# Patient Record
Sex: Female | Born: 1950 | Race: White | Hispanic: No | Marital: Married | Smoking: Never smoker
Health system: Southern US, Community
[De-identification: ages and names within clinical notes are randomized; demographics above are authoritative.]

## PROBLEM LIST (undated history)

## (undated) DIAGNOSIS — E119 Type 2 diabetes mellitus without complications: Secondary | ICD-10-CM

## (undated) DIAGNOSIS — F419 Anxiety disorder, unspecified: Secondary | ICD-10-CM

## (undated) DIAGNOSIS — C801 Malignant (primary) neoplasm, unspecified: Secondary | ICD-10-CM

## (undated) DIAGNOSIS — E785 Hyperlipidemia, unspecified: Secondary | ICD-10-CM

## (undated) HISTORY — PX: APPENDECTOMY: SHX54

## (undated) HISTORY — PX: NOSE SURGERY: SHX723

## (undated) HISTORY — PX: EYE SURGERY: SHX253

## (undated) HISTORY — PX: OTHER SURGICAL HISTORY: SHX169

## (undated) HISTORY — PX: REDUCTION MAMMAPLASTY: SUR839

## (undated) HISTORY — DX: Hyperlipidemia, unspecified: E78.5

## (undated) HISTORY — PX: BREAST SURGERY: SHX581

## (undated) HISTORY — DX: Anxiety disorder, unspecified: F41.9

## (undated) HISTORY — PX: COSMETIC SURGERY: SHX468

---

## 2005-02-14 LAB — HM COLONOSCOPY: HM Colonoscopy: NORMAL

## 2006-09-26 ENCOUNTER — Encounter (INDEPENDENT_AMBULATORY_CARE_PROVIDER_SITE_OTHER): Payer: Self-pay | Admitting: Gynecology

## 2006-09-26 ENCOUNTER — Ambulatory Visit: Payer: Self-pay | Admitting: Gynecology

## 2007-11-06 ENCOUNTER — Ambulatory Visit: Payer: Self-pay | Admitting: Gynecology

## 2009-03-27 ENCOUNTER — Emergency Department: Payer: Self-pay | Admitting: Emergency Medicine

## 2009-11-03 ENCOUNTER — Emergency Department: Payer: Self-pay | Admitting: Emergency Medicine

## 2010-02-26 DIAGNOSIS — E785 Hyperlipidemia, unspecified: Secondary | ICD-10-CM | POA: Insufficient documentation

## 2010-12-26 LAB — HM DEXA SCAN

## 2011-01-08 ENCOUNTER — Encounter
Admission: RE | Admit: 2011-01-08 | Discharge: 2011-01-08 | Payer: Self-pay | Source: Home / Self Care | Attending: Internal Medicine | Admitting: Internal Medicine

## 2011-02-11 LAB — HM COLONOSCOPY: HM Colonoscopy: NORMAL

## 2011-02-15 LAB — HM MAMMOGRAPHY: HM Mammogram: NORMAL

## 2011-04-27 NOTE — Assessment & Plan Note (Signed)
NAMETAIZ, BICKLE NO.:  [id number]   MEDICAL RECORD NO.:  [id number]          PATIENT TYPE:  POB   LOCATION:  [facility] at [facility]         FACILITY:  [facility]   PHYSICIAN:  Allie Bossier, MD        DATE OF BIRTH:  [birth year]   DATE OF SERVICE:  11/06/2007                                  CLINIC NOTE   Alexis James is a 60 year old married white gravida 2, para 3 who is here for  her annual exam.  Her complaint today is that of a 2-year history of hot  flashes. This is improved since she started generic Celexa about 6  months ago.  She currently declines hormone replacement therapy.  She  has no other complaints.   PAST MEDICAL HISTORY:  High cholesterol and history of fibroids.   REVIEW OF SYSTEMS:  She has been married for 37 years.  She denies  dyspareunia, except dryness. Her husband recently suffered a myocardial  infarction in October 2008 and underwent a CABG x5.   PAST SURGICAL HISTORY:  1. Cesarean section times two.  2. Incidental appendectomy.  3. Breast reduction.  4. Nasal surgery.   No latex allergies.  No allergies to medicine.   SOCIAL HISTORY:  Is negative for tobacco, alcohol or drug use.   FAMILY HISTORY:  Is negative for breast, GYN and colon malignancies.   MEDICATIONS:  1. Multivitamin daily.  2. Calcium daily.  3. Proventil daily.  4. Pyrexin one a day.  5. Lovastatin 10 mg daily.  6. Celexa generic 20 mg daily.   REVIEW OF SYSTEMS:  She has never had a colonoscopy and her stress test  was done September 2008 and it was negative.   PHYSICAL EXAM:  Height 5 feet 2-3/4. Weight 132-1/2 pound.  Blood  pressure 111/59, pulse 79.  HEENT:  Normal.  HEART:  Regular rate rhythm.  LUNGS:  Clear sensation bilaterally.  ABDOMEN:  Scaphoid abdomen.  No masses.  BREASTS: Exam normal.  No masses.  EXTERNAL GENITALIA:  Significant atrophy.  No lesions.  Uterus  retroverted six week size consistent with fibroids.  No adnexal masses  or  tenderness.   ASSESSMENT/PLAN:  Annual exam.  Recommended self-breast exam and self  vulvar exams monthly. For health maintenance, I have recommended she  schedule a colonoscopy with a gastroenterologist. Because her she has  had all normal Pap smears in her past, I will forego Pap smear today and  we will plan on doing that at her next exam.      Allie Bossier, MD     MCD/MEDQ  D:  11/06/2007  T:  11/06/2007  Job:  [id number]

## 2011-10-15 ENCOUNTER — Other Ambulatory Visit: Payer: Self-pay | Admitting: Internal Medicine

## 2011-10-25 ENCOUNTER — Telehealth: Payer: Self-pay | Admitting: Internal Medicine

## 2011-10-25 DIAGNOSIS — R05 Cough: Secondary | ICD-10-CM

## 2011-10-25 DIAGNOSIS — R059 Cough, unspecified: Secondary | ICD-10-CM

## 2011-10-25 NOTE — Telephone Encounter (Signed)
Patient has been coughing for  a couple of weeks, dry hacking more when she is up and moving around nothing come up when she coughs.  She has taken robitussin, and delsylmia, and tylenol cold and flu would like a prescription cough medication.  She states she hasn't been sleeping because of the cough at night as well.  Has even woke up a few mornings with a sinus headache.  She uses [facility] [address] [employer].  Please advise.

## 2011-10-26 MED ORDER — BENZONATATE 200 MG PO CAPS: 200.0000 mg | ORAL_CAPSULE | Freq: Three times a day (TID) | ORAL | Status: AC | PRN

## 2011-10-26 NOTE — Telephone Encounter (Signed)
rx for tessalon cough tablets sent to [facility]. If she needs anything tronger she will need to be evaluated so please offer her an appt

## 2011-10-26 NOTE — Telephone Encounter (Signed)
Left message notifying patient. Asked that she call the office to schedule appt if something stronger is needed.

## 2011-11-11 ENCOUNTER — Encounter: Payer: Self-pay | Admitting: Internal Medicine

## 2011-11-11 ENCOUNTER — Ambulatory Visit (INDEPENDENT_AMBULATORY_CARE_PROVIDER_SITE_OTHER): Payer: Self-pay | Admitting: Internal Medicine

## 2011-11-11 VITALS — BP 112/60 | HR 79 | Temp 98.2°F | Wt 141.0 lb

## 2011-11-11 DIAGNOSIS — J329 Chronic sinusitis, unspecified: Secondary | ICD-10-CM

## 2011-11-11 MED ORDER — BENZONATATE 200 MG PO CAPS: 200.0000 mg | ORAL_CAPSULE | Freq: Three times a day (TID) | ORAL | Status: DC | PRN

## 2011-11-11 MED ORDER — AMOXICILLIN-POT CLAVULANATE 875-125 MG PO TABS: 1.0000 | ORAL_TABLET | Freq: Two times a day (BID) | ORAL | Status: AC

## 2011-11-11 MED ORDER — HYDROCODONE-ACETAMINOPHEN 5-325 MG PO TABS: ORAL_TABLET | ORAL | Status: DC

## 2011-11-11 NOTE — Progress Notes (Signed)
Subjective:    Patient ID: Alexis James, female    DOB: March 05, 1951, 60 y.o.   MRN: [id number]  Sinusitis This is a new problem. The current episode started 1 to 4 weeks ago. The problem has been gradually worsening since onset. There has been no fever. The pain is moderate. Associated symptoms include chills, congestion, coughing, ear pain, headaches, sinus pressure and a sore throat. Pertinent negatives include no neck pain, shortness of breath or swollen glands. Past treatments include acetaminophen, oral decongestants, spray decongestants and saline sprays. The treatment provided no relief.     Outpatient Encounter Prescriptions as of 11/11/2011  Medication Sig Dispense Refill  . aspirin EC 81 MG tablet Take 81 mg by mouth every other day.        . benzonatate (TESSALON) 100 MG capsule Take 100 mg by mouth 3 (three) times daily as needed.        . calcium-vitamin D (OSCAL) 250-125 MG-UNIT per tablet Take 1 tablet by mouth daily.        . citalopram (CELEXA) 20 MG tablet Take 20 mg by mouth daily.        . fish oil-omega-3 fatty acids 1000 MG capsule Take 2 g by mouth daily.        Marland Kitchen lovastatin (MEVACOR) 20 MG tablet TAKE TWO TABLETS BY MOUTH EVERY DAY  60 tablet  3  . amoxicillin-clavulanate (AUGMENTIN) 875-125 MG per tablet Take 1 tablet by mouth 2 (two) times daily.  28 tablet  0  . HYDROcodone-acetaminophen (NORCO) 5-325 MG per tablet 1-2 tablets every 4-6 hr as needed for cough  60 tablet  0  . DISCONTD: benzonatate (TESSALON) 200 MG capsule Take 1 capsule (200 mg total) by mouth 3 (three) times daily as needed for cough.  20 capsule  0     Review of Systems  Constitutional: Positive for chills and fatigue. Negative for fever.  HENT: Positive for ear pain, congestion, sore throat, rhinorrhea and sinus pressure. Negative for neck pain and ear discharge.   Respiratory: Positive for cough. Negative for chest tightness, shortness of breath and stridor.   Cardiovascular: Negative for  chest pain.  Gastrointestinal: Negative for abdominal pain.  Musculoskeletal: Negative for myalgias and arthralgias.  Neurological: Positive for headaches.   BP 112/60  Pulse 79  Temp(Src) 98.2 F (36.8 C) (Oral)  Wt 141 lb (63.957 kg)  SpO2 98%     Objective:   Physical Exam  Constitutional: She is oriented to person, place, and time. She appears well-developed and well-nourished. No distress.  HENT:  Head: Normocephalic and atraumatic.    Right Ear: External ear normal. A middle ear effusion is present.  Left Ear: External ear normal. A middle ear effusion is present.  Nose: Nose normal.  Mouth/Throat: Posterior oropharyngeal erythema present. No oropharyngeal exudate.  Eyes: Conjunctivae are normal. Pupils are equal, round, and reactive to light. Right eye exhibits no discharge. Left eye exhibits no discharge. No scleral icterus.  Neck: Normal range of motion. Neck supple. No tracheal deviation present. No thyromegaly present.  Cardiovascular: Normal rate, regular rhythm, normal heart sounds and intact distal pulses.  Exam reveals no gallop and no friction rub.   No murmur heard. Pulmonary/Chest: Effort normal and breath sounds normal. No respiratory distress. She has no wheezes. She has no rales. She exhibits no tenderness.  Musculoskeletal: Normal range of motion. She exhibits no edema and no tenderness.  Lymphadenopathy:    She has no cervical adenopathy.  Neurological: She is  alert and oriented to person, place, and time. No cranial nerve deficit. She exhibits normal muscle tone. Coordination normal.  Skin: Skin is warm and dry. No rash noted. She is not diaphoretic. No erythema. No pallor.  Psychiatric: She has a normal mood and affect. Her behavior is normal. Judgment and thought content normal.          Assessment & Plan:  1. Sinusitis - Will treat with augmentin and use hydrocodone for cough. Pt will continue Mucinex as needed. Pt will call if symptoms not  improving over next 48hr.

## 2012-01-03 ENCOUNTER — Encounter: Payer: Self-pay | Admitting: Internal Medicine

## 2012-01-03 DIAGNOSIS — Z1211 Encounter for screening for malignant neoplasm of colon: Secondary | ICD-10-CM | POA: Insufficient documentation

## 2012-01-03 DIAGNOSIS — Z124 Encounter for screening for malignant neoplasm of cervix: Secondary | ICD-10-CM | POA: Insufficient documentation

## 2012-01-10 ENCOUNTER — Other Ambulatory Visit: Payer: Self-pay | Admitting: Internal Medicine

## 2012-02-15 ENCOUNTER — Encounter: Payer: Self-pay | Admitting: Internal Medicine

## 2012-02-19 ENCOUNTER — Other Ambulatory Visit: Payer: Self-pay | Admitting: Internal Medicine

## 2012-02-20 NOTE — Telephone Encounter (Signed)
Refill changed to 40 mg tablet #90 day supply

## 2012-02-21 ENCOUNTER — Other Ambulatory Visit: Payer: Self-pay | Admitting: Internal Medicine

## 2012-02-22 ENCOUNTER — Other Ambulatory Visit: Payer: Self-pay | Admitting: Internal Medicine

## 2012-02-22 MED ORDER — LOVASTATIN 40 MG PO TABS: 40.0000 mg | ORAL_TABLET | Freq: Every day | ORAL | Status: DC

## 2012-02-23 ENCOUNTER — Other Ambulatory Visit: Payer: Self-pay | Admitting: Internal Medicine

## 2012-02-23 MED ORDER — LOVASTATIN 20 MG PO TABS: 40.0000 mg | ORAL_TABLET | Freq: Every day | ORAL | Status: DC

## 2012-05-26 ENCOUNTER — Encounter: Payer: Self-pay | Admitting: Internal Medicine

## 2013-01-30 ENCOUNTER — Telehealth: Payer: Self-pay | Admitting: Internal Medicine

## 2013-01-30 ENCOUNTER — Other Ambulatory Visit: Payer: Self-pay | Admitting: Internal Medicine

## 2013-01-30 DIAGNOSIS — Z0000 Encounter for general adult medical examination without abnormal findings: Secondary | ICD-10-CM

## 2013-01-30 NOTE — Telephone Encounter (Signed)
She can be moved up into a 30 minute physical slot.  We have her records. i cannto refill her lovastatin until she has fasting ;i[ids and a CMET. Done bc it has been over one year.  Her citalopram canb e refilled,  But not the vicodin

## 2013-01-30 NOTE — Telephone Encounter (Signed)
Pt has made an appt 6/9 as a new pt.  She is a previous pt of Dr. Darrick Huntsman but she has not seen Dr. Darrick Huntsman in this office (has seen Dr. Dan Humphreys for an acute visit here). Pt is running out of her medication.  Pt is asking if her meds can be filled or if her appt can be moved closer so that her meds can be filled if she needs to be seen first.

## 2013-01-31 NOTE — Telephone Encounter (Signed)
No refills until cmet and fasting lipids done

## 2013-02-01 MED ORDER — CITALOPRAM HYDROBROMIDE 20 MG PO TABS: ORAL_TABLET | ORAL | Status: DC

## 2013-02-01 NOTE — Telephone Encounter (Signed)
Pt notified and CPE and Lab appts made, citalopram refilled, and orders placed.

## 2013-02-09 ENCOUNTER — Other Ambulatory Visit (INDEPENDENT_AMBULATORY_CARE_PROVIDER_SITE_OTHER): Payer: BC Managed Care – PPO

## 2013-02-09 DIAGNOSIS — Z0000 Encounter for general adult medical examination without abnormal findings: Secondary | ICD-10-CM

## 2013-02-09 LAB — LIPID PANEL
HDL: 61.6 mg/dL (ref >39.00)
Total CHOL/HDL Ratio: 3
Triglycerides: 82 mg/dL (ref 0.0–149.0)
VLDL: 16.4 mg/dL (ref 0.0–40.0)

## 2013-02-09 LAB — COMPREHENSIVE METABOLIC PANEL
ALT: 24 U/L (ref 0–35)
Alkaline Phosphatase: 63 U/L (ref 39–117)
Creatinine, Ser: 0.8 mg/dL (ref 0.4–1.2)
Sodium: 140 mEq/L (ref 135–145)
Total Bilirubin: 0.6 mg/dL (ref 0.3–1.2)
Total Protein: 6.5 g/dL (ref 6.0–8.3)

## 2013-02-14 ENCOUNTER — Encounter: Payer: Self-pay | Admitting: Internal Medicine

## 2013-02-14 ENCOUNTER — Ambulatory Visit (INDEPENDENT_AMBULATORY_CARE_PROVIDER_SITE_OTHER): Payer: BC Managed Care – PPO | Admitting: Internal Medicine

## 2013-02-14 ENCOUNTER — Other Ambulatory Visit (HOSPITAL_COMMUNITY)
Admission: RE | Admit: 2013-02-14 | Discharge: 2013-02-14 | Disposition: A | Discharge status: Home / Self Care | Payer: BC Managed Care – PPO | Source: Ambulatory Visit | Attending: Internal Medicine | Admitting: Internal Medicine

## 2013-02-14 VITALS — BP 108/60 | HR 58 | Temp 97.5°F | Resp 16 | Ht 63.0 in | Wt 140.8 lb

## 2013-02-14 DIAGNOSIS — Z0000 Encounter for general adult medical examination without abnormal findings: Secondary | ICD-10-CM

## 2013-02-14 DIAGNOSIS — Z01419 Encounter for gynecological examination (general) (routine) without abnormal findings: Secondary | ICD-10-CM | POA: Insufficient documentation

## 2013-02-14 DIAGNOSIS — Z1151 Encounter for screening for human papillomavirus (HPV): Secondary | ICD-10-CM | POA: Insufficient documentation

## 2013-02-14 DIAGNOSIS — E785 Hyperlipidemia, unspecified: Secondary | ICD-10-CM

## 2013-02-14 DIAGNOSIS — F411 Generalized anxiety disorder: Secondary | ICD-10-CM

## 2013-02-14 DIAGNOSIS — Z1239 Encounter for other screening for malignant neoplasm of breast: Secondary | ICD-10-CM

## 2013-02-14 DIAGNOSIS — Z124 Encounter for screening for malignant neoplasm of cervix: Secondary | ICD-10-CM

## 2013-02-14 NOTE — Progress Notes (Signed)
Patient ID: Alexis James, female   DOB: Sep 27, 1951, 62 y.o.   MRN: [id number]    Subjective:     Alexis James is a 62 y.o. female and is here for a comprehensive physical exam. The patient reports chronic anxiety managed with celexa. She was treated by [facility] in December for an uncomplicated URI and all symptoms have resolved.    History   Social History  . Marital Status: Married    Spouse Name: N/A    Number of Children: 3  . Years of Education: N/A   Occupational History  . Not on file.   Social History Main Topics  . Smoking status: Passive Smoke Exposure - Never Smoker  . Smokeless tobacco: Never Used     Comment: Husband smoked x 38 years (quit 4 years ago)  . Alcohol Use: Yes     Comment: Occasional  . Drug Use: No  . Sexually Active: Not on file   Other Topics Concern  . Not on file   Social History Narrative  . No narrative on file   Health Maintenance  Topic Date Due  . Tetanus/tdap  05/17/1969  . Zostavax  05/17/2010  . Mammogram  02/14/2013  . Influenza Vaccine  08/13/2013  . Pap Smear  12/02/2013  . Colonoscopy  02/15/2015    The following portions of the patient's history were reviewed and updated as appropriate: allergies, current medications, past family history, past medical history, past social history, past surgical history and problem list.  Review of Systems A comprehensive review of systems was negative.   Objective:    BP 108/60  Pulse 58  Temp(Src) 97.5 F (36.4 C) (Oral)  Resp 16  Ht 5\' 3"  (1.6 m)  Wt 140 lb 12 oz (63.844 kg)  BMI 24.94 kg/m2  SpO2 96%  General Appearance:    Alert, cooperative, no distress, appears stated age  Head:    Normocephalic, without obvious abnormality, atraumatic  Eyes:    PERRL, conjunctiva/corneas clear, EOM's intact, fundi    benign, both eyes  Ears:    Normal TM's and external ear canals, both ears  Nose:   Nares normal, septum midline, mucosa normal, no drainage    or sinus tenderness   Throat:   Lips, mucosa, and tongue normal; teeth and gums normal  Neck:   Supple, symmetrical, trachea midline, no adenopathy;    thyroid:  no enlargement/tenderness/nodules; no carotid   bruit or JVD  Back:     Symmetric, no curvature, ROM normal, no CVA tenderness  Lungs:     Clear to auscultation bilaterally, respirations unlabored  Chest Wall:    No tenderness or deformity   Heart:    Regular rate and rhythm, S1 and S2 normal, no murmur, rub   or gallop  Breast Exam:    No tenderness, masses, or nipple abnormality  Abdomen:     Soft, non-tender, bowel sounds active all four quadrants,    no masses, no organomegaly  Genitalia:    Pelvic: cervix normal in appearance, external genitalia normal, no adnexal masses or tenderness, no cervical motion tenderness, rectovaginal septum normal, uterus normal size, shape, and consistency and vagina normal without discharge  Extremities:   Extremities normal, atraumatic, no cyanosis or edema  Pulses:   2+ and symmetric all extremities  Skin:   Skin color, texture, turgor normal, no rashes or lesions  Lymph nodes:   Cervical, supraclavicular, and axillary nodes normal  Neurologic:   CNII-XII intact, normal strength,  sensation and reflexes    throughout    Assessment:    HYPERLIPIDEMIA-MIXED LDL and triglycerides are at goal on current medications. She has no side effects and liver enzymes are normal. No changes today   Routine general medical examination at a health care facility Annual comprehensive exam was done including breast, pelvic and PAP smear. All screenings have been addressed .   Generalized anxiety disorder Symptoms have been managed with daily SSRI therapy and infrequent use of anxiolytics.  Patient has periodically suspended medication with return of symptoms.  Refills given.    Updated Medication List Outpatient Encounter Prescriptions as of 02/14/2013  Medication Sig Dispense Refill  . aspirin EC 81 MG tablet Take 81 mg by  mouth every other day.       . calcium-vitamin D (OSCAL) 250-125 MG-UNIT per tablet Take 1 tablet by mouth daily.        . citalopram (CELEXA) 20 MG tablet TAKE ONE TABLET BY MOUTH EVERY DAY  90 tablet  3  . lovastatin (MEVACOR) 20 MG tablet Take 2 tablets (40 mg total) by mouth at bedtime.  180 tablet  3  . Multiple Vitamin (MULTIVITAMIN) tablet Take 1 tablet by mouth daily.      . fish oil-omega-3 fatty acids 1000 MG capsule Take 2 g by mouth daily.        . [DISCONTINUED] benzonatate (TESSALON) 100 MG capsule Take 100 mg by mouth 3 (three) times daily as needed.        . [DISCONTINUED] HYDROcodone-acetaminophen (NORCO) 5-325 MG per tablet 1-2 tablets every 4-6 hr as needed for cough  60 tablet  0   No facility-administered encounter medications on file as of 02/14/2013.

## 2013-02-14 NOTE — Patient Instructions (Addendum)
Your reflux needs to be treated more decisively with a proton pump inhibitor.  I would like you to try taking OTC prilosec (omeprazole) daily in the monrining 20 minutes prior to any food or coffee.  If it does not control your symptoms,  Let me know.   We will repeat your DEXA scan in 2015.Marland Kitchen Until then you should try to get 1200 mg calcium(half through your diet,  Half with a supplement)  and 2000 units  of Vitamin D daily to keep your bones strong

## 2013-02-16 ENCOUNTER — Encounter: Payer: Self-pay | Admitting: Internal Medicine

## 2013-02-16 DIAGNOSIS — F411 Generalized anxiety disorder: Secondary | ICD-10-CM | POA: Insufficient documentation

## 2013-02-16 DIAGNOSIS — E785 Hyperlipidemia, unspecified: Secondary | ICD-10-CM | POA: Insufficient documentation

## 2013-02-16 DIAGNOSIS — Z0000 Encounter for general adult medical examination without abnormal findings: Secondary | ICD-10-CM | POA: Insufficient documentation

## 2013-02-16 DIAGNOSIS — F419 Anxiety disorder, unspecified: Secondary | ICD-10-CM | POA: Insufficient documentation

## 2013-02-16 NOTE — Assessment & Plan Note (Signed)
Symptoms have been managed with daily SSRI therapy and infrequent use of anxiolytics.  Patient has periodically suspended medication with return of symptoms.  Refills given.

## 2013-02-16 NOTE — Assessment & Plan Note (Signed)
Annual comprehensive exam was done including breast, pelvic and PAP smear. All screenings have been addressed .  

## 2013-02-16 NOTE — Assessment & Plan Note (Signed)
LDL and triglycerides are at goal on current medications. She has no side effects and liver enzymes are normal. No changes today.  

## 2013-02-20 ENCOUNTER — Other Ambulatory Visit: Payer: Self-pay | Admitting: Internal Medicine

## 2013-02-21 NOTE — Telephone Encounter (Signed)
MEd filled.  

## 2013-02-26 ENCOUNTER — Telehealth: Payer: Self-pay | Admitting: Emergency Medicine

## 2013-02-26 NOTE — Telephone Encounter (Signed)
Patient has been taking Prilosec X 5days, the patient feels like it is making her stomach hurt. She is wondering if she could take Pepcid instead? Please advise.

## 2013-02-26 NOTE — Telephone Encounter (Signed)
Pt.notified

## 2013-02-26 NOTE — Telephone Encounter (Signed)
Yes, she can take pepcid up to 40 mg daily instead of prilosec

## 2013-03-07 ENCOUNTER — Ambulatory Visit: Payer: Self-pay | Admitting: Internal Medicine

## 2013-03-07 LAB — HM PAP SMEAR: HM PAP: NORMAL

## 2013-03-07 LAB — HM MAMMOGRAPHY
HM MAMMO: NORMAL
HM Mammogram: NORMAL

## 2013-03-28 ENCOUNTER — Encounter: Payer: Self-pay | Admitting: Internal Medicine

## 2013-04-27 ENCOUNTER — Telehealth: Payer: Self-pay | Admitting: *Deleted

## 2013-04-27 NOTE — Telephone Encounter (Signed)
Pt was seen in March, had a mammogram done at [facility]. Received a statement stating that her mammogram was coded incorrectly. Needs it corrected and resent to insurance. Pt also stated that she was suppose to go to a in-house facility instead of the hospital to have her mammogram done & it would have been covered at 100%.

## 2013-04-28 NOTE — Telephone Encounter (Signed)
Carollee Herter please handle this call.  Thank you

## 2013-04-30 ENCOUNTER — Ambulatory Visit (INDEPENDENT_AMBULATORY_CARE_PROVIDER_SITE_OTHER): Payer: BC Managed Care – PPO | Admitting: Adult Health

## 2013-04-30 ENCOUNTER — Encounter: Payer: Self-pay | Admitting: Adult Health

## 2013-04-30 ENCOUNTER — Telehealth: Payer: Self-pay | Admitting: Internal Medicine

## 2013-04-30 VITALS — BP 110/62 | HR 78 | Temp 98.0°F | Resp 12 | Wt 138.0 lb

## 2013-04-30 DIAGNOSIS — R197 Diarrhea, unspecified: Secondary | ICD-10-CM | POA: Insufficient documentation

## 2013-04-30 LAB — BASIC METABOLIC PANEL
BUN: 16 mg/dL (ref 6–23)
CO2: 27 mEq/L (ref 19–32)
GFR: 77.01 mL/min (ref >60.00)
Glucose, Bld: 82 mg/dL (ref 70–99)
Potassium: 4.5 mEq/L (ref 3.5–5.1)
Sodium: 136 mEq/L (ref 135–145)

## 2013-04-30 LAB — CBC WITH DIFFERENTIAL/PLATELET
Basophils Relative: 0.3 % (ref 0.0–3.0)
Eosinophils Relative: 1 % (ref 0.0–5.0)
HCT: 36.9 % (ref 36.0–46.0)
MCV: 93 fl (ref 78.0–100.0)
Monocytes Absolute: 0.7 10*3/uL (ref 0.1–1.0)
Monocytes Relative: 16.1 % — ABNORMAL HIGH (ref 3.0–12.0)
Neutrophils Relative %: 62.3 % (ref 43.0–77.0)
Platelets: 251 10*3/uL (ref 150.0–400.0)
RBC: 3.97 Mil/uL (ref 3.87–5.11)
WBC: 4.2 10*3/uL — ABNORMAL LOW (ref 4.5–10.5)

## 2013-04-30 NOTE — Patient Instructions (Addendum)
  Please have labs drawn prior to leaving the office.  Drink plenty of fluids to stay hydrated.  Eat a bland diet such as dry toast, soups that are not cream-based, plain potato and grilled chicken.  If you are not better within 3-4 days please call the office

## 2013-04-30 NOTE — Assessment & Plan Note (Signed)
Check CBC and metabolic panel. Instructed patient to drink fluids and maintain a bland diet for the next couple of days. If symptoms are not improved within 3-4 days she needs to call the office.

## 2013-04-30 NOTE — Telephone Encounter (Signed)
Patient Information:  Caller Name: [place]  Phone: [phone]  Patient: Alexis James  Gender: Female  DOB: 1951-01-26  Age: 62 Years  PCP: Duncan Dull (Adults only)  Office Follow Up:  Does the office need to follow up with this patient?: No  Instructions For The Office: N/A  RN Note:  Reports malaise followed by mild diarrhea (< 3 stools in 24 hrs) for 2 days.  Had two episodes of fecal incontinence.  Diarrhea ended 04/28/13.  Reports syncope 04/27/13 after having BM and near syncope with nausea 04/29/13 early AM.  Continues to feel "drained," fatigued and weak. Posterior leg pain at night resolved with Ibuprofen. Pulse 84 bpm and regular.  Not drinking much; voiding less often than usual.  Advised to hydrate with clear liquids now; report to provider how many oz. she drank at time of appointment.    Symptoms  Reason For Call & Symptoms: Requesting RX for malaise.  Reports feeling drained, weakness, fatigue and two episodes of syncope.  Reviewed Health History In EMR: Yes  Reviewed Medications In EMR: Yes  Reviewed Allergies In EMR: Yes  Reviewed Surgeries / Procedures: Yes  Date of Onset of Symptoms: 04/26/2013  Treatments Tried: immodium, Ibuprofen  Treatments Tried Worked: Yes  Guideline(s) Used:  Weakness (Generalized) and Fatigue  Disposition Per Guideline:   Go to Office Now  Reason For Disposition Reached:   Moderate weakness (i.e., interferes with work, school, normal activities) and cause unknown  Advice Given:  Reassurance  Weakness often accompanies viral illnesses (e.g., colds and flu).  The weakness is usually worse the first 3 days of the illness, then gets better.  Here is some care advice that should help.  Fluids  : Drink several glasses of fruit juice, other clear fluids, or water. This will improve hydration and blood glucose.  Rest  : Lie down with feet elevated for 1 hour. This will improve blood flow and increase blood flow to the brain.  Call Back  If:   Passes out (faints)  You become worse.  Patient Will Follow Care Advice:  YES  Appointment Scheduled:  04/30/2013 11:30:00 Appointment Scheduled Provider:  Orville Govern

## 2013-04-30 NOTE — Progress Notes (Signed)
  Subjective:    Patient ID: Alexis James, female    DOB: Jul 04, 1951, 62 y.o.   MRN: [id number]  HPI  Thursday went out to eat. She felt weak and sick that afternoon. She started to have diarrhea. She felt feverish although she did not take her temperature. She also had a few episodes where she felt she was going to pass out. She has not eaten much of anything since Thursday. She has had a few saltine crackers and fluids. "I'm drained and have no stamina to do anything". Patient took Imodium which helped her diarrhea.   Current Outpatient Prescriptions on File Prior to Visit  Medication Sig Dispense Refill  . aspirin EC 81 MG tablet Take 81 mg by mouth every other day.       . calcium-vitamin D (OSCAL) 250-125 MG-UNIT per tablet Take 1 tablet by mouth daily.        . citalopram (CELEXA) 20 MG tablet TAKE ONE TABLET BY MOUTH EVERY DAY  90 tablet  3  . lovastatin (MEVACOR) 40 MG tablet TAKE ONE TABLET BY MOUTH EVERY DAY AT BEDTIME  90 tablet  0  . Multiple Vitamin (MULTIVITAMIN) tablet Take 1 tablet by mouth daily.       No current facility-administered medications on file prior to visit.     Review of Systems  Constitutional: Positive for chills and fatigue.  Gastrointestinal: Positive for nausea and diarrhea.  Neurological: Positive for weakness.   BP 110/62  Pulse 78  Temp(Src) 98 F (36.7 C) (Oral)  Resp 12  Wt 138 lb (62.596 kg)  BMI 24.45 kg/m2  SpO2 97%    Objective:   Physical Exam  Constitutional: She is oriented to person, place, and time. She appears well-developed and well-nourished. No distress.  Cardiovascular: Normal rate, regular rhythm, normal heart sounds and intact distal pulses.  Exam reveals no gallop and no friction rub.   No murmur heard. Pulmonary/Chest: Effort normal and breath sounds normal. No respiratory distress.  Abdominal: Soft.  Hyperactive bowel sounds  Neurological: She is alert and oriented to person, place, and time.  Skin: Skin is warm and  dry.  Psychiatric: She has a normal mood and affect. Her behavior is normal. Judgment and thought content normal.         Assessment & Plan:

## 2013-05-02 ENCOUNTER — Other Ambulatory Visit: Payer: Self-pay | Admitting: Internal Medicine

## 2013-05-03 NOTE — Telephone Encounter (Signed)
Rx sent to pharmacy by escript  

## 2013-05-14 NOTE — Telephone Encounter (Signed)
I called and spoke with patient she states that [employer] said that it was coded incorrectly.  She states that it should have been coded as screening or routine.  I advised patient that I wasn't over the [facility] billing department and that she would need to contact them and ask them to re-file the claim.  She also did say that when she checked in at [facility] they told her they accepted [employer] and now she feels she was sent to the wrong place.  She is going to take my advise and call the number on her bill and ask them to re-file claim.

## 2013-05-21 ENCOUNTER — Ambulatory Visit: Payer: Self-pay | Admitting: Internal Medicine

## 2013-06-13 ENCOUNTER — Telehealth: Payer: Self-pay | Admitting: Internal Medicine

## 2013-06-13 NOTE — Telephone Encounter (Signed)
Message copied by Lysbeth Galas on Wed Jun 13, 2013  9:17 AM ------      Message from: [place], Kyra Searles      Created: Wed Jun 13, 2013  9:14 AM       I received an e-mail from [facility] stating that the reason the patient has a bill is from [employer] 04-30-13 of $115.63 that is due to patients yearly deductible.  They said that 99213 102.17 pt. And 40981 13.46 pt left due.  I have called the patient and left her a voicemail asking her to return my call.            Carollee Herter       ----- Message -----         From: Lysbeth Galas         Sent: 05/24/2013  12:03 PM           To: Lysbeth Galas            I sent email to charge correction asking them to look at bill.      ----- Message -----         From: Lysbeth Galas         Sent: 05/14/2013   2:30 PM           To: Lysbeth Galas            Please look at bill from [place] she states it is from HPV which is analysis please call her at   [phone]             ------

## 2013-06-13 NOTE — Telephone Encounter (Signed)
Patient returned my call and I advised her that the bill she was receiving from [facility] was from [facility] 04-30-13 not her Physical.  She also asked again about the [facility] bill I advised her that she would need to contact them I couldn't do anything about their bill. She understood and will call them to find out about the bill there.

## 2013-11-09 ENCOUNTER — Other Ambulatory Visit: Payer: Self-pay | Admitting: Internal Medicine

## 2014-02-05 ENCOUNTER — Other Ambulatory Visit: Payer: Self-pay | Admitting: Internal Medicine

## 2014-03-11 ENCOUNTER — Telehealth: Payer: Self-pay | Admitting: Internal Medicine

## 2014-03-11 NOTE — Telephone Encounter (Signed)
Last lipid 2/14 last Ov 5/14 okay to fill?

## 2014-03-11 NOTE — Telephone Encounter (Signed)
Pt has CPE 4/14.  States she is almost out of her citalopram and simvastatin.  Asking if these can be refilled as she does not have enough to get her through to her CPE.

## 2014-03-12 MED ORDER — CITALOPRAM HYDROBROMIDE 20 MG PO TABS: ORAL_TABLET | ORAL | Status: DC

## 2014-03-12 MED ORDER — LOVASTATIN 40 MG PO TABS: ORAL_TABLET | ORAL | Status: DC

## 2014-03-12 NOTE — Telephone Encounter (Signed)
Refill one 30 days only.  Has not been seen in over 6 months so needs office visit prior to any more refills 

## 2014-03-12 NOTE — Telephone Encounter (Signed)
Pt.notified

## 2014-03-26 ENCOUNTER — Telehealth: Payer: Self-pay | Admitting: Internal Medicine

## 2014-03-26 ENCOUNTER — Ambulatory Visit (INDEPENDENT_AMBULATORY_CARE_PROVIDER_SITE_OTHER): Payer: BC Managed Care – PPO | Admitting: Internal Medicine

## 2014-03-26 ENCOUNTER — Encounter: Payer: Self-pay | Admitting: Internal Medicine

## 2014-03-26 ENCOUNTER — Encounter (INDEPENDENT_AMBULATORY_CARE_PROVIDER_SITE_OTHER): Payer: Self-pay

## 2014-03-26 ENCOUNTER — Telehealth: Payer: Self-pay | Admitting: Emergency Medicine

## 2014-03-26 VITALS — BP 104/60 | HR 78 | Temp 98.1°F | Resp 16 | Ht 63.0 in | Wt 143.0 lb

## 2014-03-26 DIAGNOSIS — R5383 Other fatigue: Secondary | ICD-10-CM

## 2014-03-26 DIAGNOSIS — F419 Anxiety disorder, unspecified: Secondary | ICD-10-CM

## 2014-03-26 DIAGNOSIS — E785 Hyperlipidemia, unspecified: Secondary | ICD-10-CM

## 2014-03-26 DIAGNOSIS — E559 Vitamin D deficiency, unspecified: Secondary | ICD-10-CM

## 2014-03-26 DIAGNOSIS — F411 Generalized anxiety disorder: Secondary | ICD-10-CM

## 2014-03-26 DIAGNOSIS — Z0000 Encounter for general adult medical examination without abnormal findings: Secondary | ICD-10-CM

## 2014-03-26 DIAGNOSIS — Z79899 Other long term (current) drug therapy: Secondary | ICD-10-CM

## 2014-03-26 DIAGNOSIS — R42 Dizziness and giddiness: Secondary | ICD-10-CM

## 2014-03-26 DIAGNOSIS — R5381 Other malaise: Secondary | ICD-10-CM

## 2014-03-26 DIAGNOSIS — N951 Menopausal and female climacteric states: Secondary | ICD-10-CM | POA: Insufficient documentation

## 2014-03-26 LAB — COMPREHENSIVE METABOLIC PANEL
ALT: 21 U/L (ref 0–35)
AST: 28 U/L (ref 0–37)
Albumin: 4.1 g/dL (ref 3.5–5.2)
Alkaline Phosphatase: 63 U/L (ref 39–117)
BUN: 23 mg/dL (ref 6–23)
CALCIUM: 9.6 mg/dL (ref 8.4–10.5)
CHLORIDE: 101 meq/L (ref 96–112)
CO2: 28 meq/L (ref 19–32)
CREATININE: 0.7 mg/dL (ref 0.4–1.2)
GFR: 86.72 mL/min (ref >60.00)
Glucose, Bld: 79 mg/dL (ref 70–99)
Potassium: 4.3 mEq/L (ref 3.5–5.1)
Sodium: 138 mEq/L (ref 135–145)
Total Bilirubin: 0.5 mg/dL (ref 0.3–1.2)
Total Protein: 6.6 g/dL (ref 6.0–8.3)

## 2014-03-26 LAB — LDL CHOLESTEROL, DIRECT: Direct LDL: 94.3 mg/dL

## 2014-03-26 LAB — TSH: TSH: 3.15 u[IU]/mL (ref 0.35–5.50)

## 2014-03-26 MED ORDER — ESCITALOPRAM OXALATE 10 MG PO TABS: 10.0000 mg | ORAL_TABLET | Freq: Every day | ORAL | Status: DC

## 2014-03-26 MED ORDER — SULFAMETHOXAZOLE-TMP DS 800-160 MG PO TABS: 1.0000 | ORAL_TABLET | Freq: Two times a day (BID) | ORAL | Status: DC

## 2014-03-26 MED ORDER — SCOPOLAMINE 1 MG/3DAYS TD PT72
1.0000 | MEDICATED_PATCH | TRANSDERMAL | Status: DC
Start: 2014-03-26 — End: 2015-03-28

## 2014-03-26 NOTE — Telephone Encounter (Signed)
[name] 04/17/14 at 120 p.m. Pt aware of the apt scheduled.

## 2014-03-26 NOTE — Assessment & Plan Note (Addendum)
She is tolerating the medication withoutside effects and liver enzymes are normal. No changes today

## 2014-03-26 NOTE — Assessment & Plan Note (Signed)
She has been taking citalopram for years for management of hot flashes which has not really helped but feels better on medication.  Trial of lexapro.

## 2014-03-26 NOTE — Telephone Encounter (Signed)
tdap to be done here after cruise,  Shingles not needed,  My mistake

## 2014-03-26 NOTE — Telephone Encounter (Signed)
At checkout pt states paperwork says to receive shingles and TdaP vaccines.  Pt states she received shingles vaccine when she turned 59.  States it has been a long time since she received tetanus shot so she is okay with receiving.  Appt for blood work was scheduled 4/27, pt states this should be filed as wellness labs so that they are covered by insurance.  Please advise if pt needs shingles vaccine again and when she should receive tetanus vaccine, per pt.

## 2014-03-26 NOTE — Progress Notes (Signed)
Pre-visit discussion using our clinic review tool. No additional management support is needed unless otherwise documented below in the visit note.  

## 2014-03-26 NOTE — Assessment & Plan Note (Signed)
Discussed changing citalopram to lexapro for alleviation of hot flashes

## 2014-03-26 NOTE — Assessment & Plan Note (Signed)
Recent episode discussed.  She has no risk factors for stroke that are not managed .  Reassurance provided.

## 2014-03-26 NOTE — Patient Instructions (Addendum)
You had your annual  wellness exam today  We will schedule your mammogram  At [facility] if they are covered .    You need to have a TDaP vaccine and a Shingles vaccine.  You can return after your cruise to have them done here    Please return for fasting labs  At your lesiure, we have checked your liver enzymes today  We will contact you with the bloodwork results  I have given you prescriptions  For scopalamine patch for seasickness,  Septra for UTI in case you need them on your trip

## 2014-03-26 NOTE — Progress Notes (Signed)
Patient ID: Alexis James, female   DOB: 1951/11/22, 63 y.o.   MRN: [id number]  Subjective:     Alexis James is a 63 y.o. female and is here for a comprehensive physical exam. The patient reports problems - as follows.   Frequent wakenings due to hot flashes,  Which are somewhat improved but still problematic.  She had an episode of dizziness a month ago that woke her up. Lasted 40 minutes,  Vertigo,  followed by a headache.  EMS was called, her vital signs were normal except bp 144/84.  EKG normal,  BCG was normal.  No prior or subsequent episodes.   History   Social History  . Marital Status: Married    Spouse Name: N/A    Number of Children: 3  . Years of Education: N/A   Occupational History  . Not on file.   Social History Main Topics  . Smoking status: Passive Smoke Exposure - Never Smoker  . Smokeless tobacco: Never Used     Comment: Husband smoked x 38 years (quit 4 years ago)  . Alcohol Use: Yes     Comment: Occasional  . Drug Use: No  . Sexual Activity: Not on file   Other Topics Concern  . Not on file   Social History Narrative  . No narrative on file   Health Maintenance  Topic Date Due  . Tetanus/tdap  05/17/1969  . Zostavax  05/17/2010  . Influenza Vaccine  07/13/2014  . Colonoscopy  02/15/2015  . Mammogram  03/08/2015  . Pap Smear  02/15/2016    The following portions of the patient's history were reviewed and updated as appropriate: allergies, current medications, past family history, past medical history, past social history, past surgical history and problem list.  Review of Systems A comprehensive review of systems was negative.   Objective:   BP 104/60  Pulse 78  Temp(Src) 98.1 F (36.7 C) (Oral)  Resp 16  Ht 5\' 3"  (1.6 m)  Wt 143 lb (64.864 kg)  BMI 25.34 kg/m2  SpO2 98%  General appearance: alert, cooperative and appears stated age Head: Normocephalic, without obvious abnormality, atraumatic Eyes: conjunctivae/corneas clear. PERRL,  EOM's intact. Fundi benign. Ears: normal TM's and external ear canals both ears Nose: Nares normal. Septum midline. Mucosa normal. No drainage or sinus tenderness. Throat: lips, mucosa, and tongue normal; teeth and gums normal Neck: no adenopathy, no carotid bruit, no JVD, supple, symmetrical, trachea midline and thyroid not enlarged, symmetric, no tenderness/mass/nodules Lungs: clear to auscultation bilaterally Breasts: normal appearance, no masses or tenderness Heart: regular rate and rhythm, S1, S2 normal, no murmur, click, rub or gallop Abdomen: soft, non-tender; bowel sounds normal; no masses,  no organomegaly Extremities: extremities normal, atraumatic, no cyanosis or edema Pulses: 2+ and symmetric Skin: Skin color, texture, turgor normal. No rashes or lesions Neurologic: Alert and oriented X 3, normal strength and tone. Normal symmetric reflexes. Normal coordination and gait.   Assessment and Plan:   Routine general medical examination at a health care facility Annual comprehensive exam was done including breast, excluding pelvic and PAP smear. All screenings have been addressed .   HYPERLIPIDEMIA-MIXED She is tolerating the medication withoutside effects and liver enzymes are normal. No changes today     Menopausal flushing Discussed changing citalopram to lexapro for alleviation of hot flashes  Anxiety She has been taking citalopram for years for management of hot flashes which has not really helped but feels better on medication.  Trial of lexapro.  Vertigo Recent episode discussed.  She has no risk factors for stroke that are not managed .  Reassurance provided.   A total of 45 minutes was spent with patient more than half of which was spent in counseling, reviewing records from other prviders and coordination of care.  Updated Medication List Outpatient Encounter Prescriptions as of 03/26/2014  Medication Sig  . aspirin EC 81 MG tablet Take 81 mg by mouth every  other day.   . citalopram (CELEXA) 20 MG tablet TAKE ONE TABLET BY MOUTH ONCE DAILY  . lovastatin (MEVACOR) 40 MG tablet TAKE ONE TABLET BY MOUTH AT BEDTIME  . calcium-vitamin D (OSCAL) 250-125 MG-UNIT per tablet Take 1 tablet by mouth daily.    Marland Kitchen escitalopram (LEXAPRO) 10 MG tablet Take 1 tablet (10 mg total) by mouth daily.  . Multiple Vitamin (MULTIVITAMIN) tablet Take 1 tablet by mouth daily.  Marland Kitchen scopolamine (TRANSDERM-SCOP) 1 MG/3DAYS Place 1 patch (1.5 mg total) onto the skin every 3 (three) days.  Marland Kitchen sulfamethoxazole-trimethoprim (BACTRIM DS) 800-160 MG per tablet Take 1 tablet by mouth 2 (two) times daily.

## 2014-03-26 NOTE — Telephone Encounter (Signed)
Please advise if should mail script or have patient come here for Tdap.

## 2014-03-26 NOTE — Assessment & Plan Note (Signed)
Annual comprehensive exam was done including breast, excluding pelvic and PAP smear. All screenings have been addressed .  

## 2014-03-27 LAB — VITAMIN D 25 HYDROXY (VIT D DEFICIENCY, FRACTURES): Vit D, 25-Hydroxy: 49 ng/mL (ref 30–89)

## 2014-03-27 NOTE — Telephone Encounter (Signed)
Patient notified

## 2014-03-28 ENCOUNTER — Encounter: Payer: Self-pay | Admitting: *Deleted

## 2014-04-08 ENCOUNTER — Telehealth: Payer: Self-pay | Admitting: *Deleted

## 2014-04-08 ENCOUNTER — Other Ambulatory Visit (INDEPENDENT_AMBULATORY_CARE_PROVIDER_SITE_OTHER): Payer: BC Managed Care – PPO

## 2014-04-08 DIAGNOSIS — E785 Hyperlipidemia, unspecified: Secondary | ICD-10-CM

## 2014-04-08 DIAGNOSIS — Z79899 Other long term (current) drug therapy: Secondary | ICD-10-CM

## 2014-04-08 LAB — COMPREHENSIVE METABOLIC PANEL
ALT: 24 U/L (ref 0–35)
AST: 24 U/L (ref 0–37)
Albumin: 3.5 g/dL (ref 3.5–5.2)
Alkaline Phosphatase: 60 U/L (ref 39–117)
BILIRUBIN TOTAL: 0.5 mg/dL (ref 0.3–1.2)
BUN: 16 mg/dL (ref 6–23)
CO2: 30 mEq/L (ref 19–32)
CREATININE: 0.6 mg/dL (ref 0.4–1.2)
Calcium: 9.1 mg/dL (ref 8.4–10.5)
Chloride: 104 mEq/L (ref 96–112)
GFR: 101.15 mL/min (ref >60.00)
Glucose, Bld: 85 mg/dL (ref 70–99)
Potassium: 4.5 mEq/L (ref 3.5–5.1)
SODIUM: 140 meq/L (ref 135–145)
TOTAL PROTEIN: 5.9 g/dL — AB (ref 6.0–8.3)

## 2014-04-08 LAB — LIPID PANEL
CHOLESTEROL: 183 mg/dL (ref 0–200)
HDL: 60 mg/dL (ref >39.00)
LDL Cholesterol: 105 mg/dL — ABNORMAL HIGH (ref 0–99)
Total CHOL/HDL Ratio: 3
Triglycerides: 88 mg/dL (ref 0.0–149.0)
VLDL: 17.6 mg/dL (ref 0.0–40.0)

## 2014-04-08 NOTE — Telephone Encounter (Signed)
What labs and dx?  

## 2014-04-11 ENCOUNTER — Encounter: Payer: Self-pay | Admitting: *Deleted

## 2014-04-17 ENCOUNTER — Ambulatory Visit: Payer: Self-pay | Admitting: Internal Medicine

## 2014-04-27 ENCOUNTER — Telehealth: Payer: Self-pay | Admitting: Internal Medicine

## 2014-04-29 MED ORDER — LEXAPRO 10 MG PO TABS: 10.0000 mg | ORAL_TABLET | Freq: Every day | ORAL | Status: DC

## 2014-04-29 MED ORDER — LOVASTATIN 40 MG PO TABS: ORAL_TABLET | ORAL | Status: DC

## 2014-04-29 NOTE — Telephone Encounter (Signed)
Patient would like Lexapro sent to [facility] for 90 day supply ok to fill?

## 2014-04-29 NOTE — Addendum Note (Signed)
Addended by: Nanci Pina on: 04/29/2014 09:36 AM   Modules accepted: Orders

## 2014-04-29 NOTE — Addendum Note (Signed)
Addended by: Crecencio Mc on: 04/29/2014 10:25 AM   Modules accepted: Orders

## 2014-04-29 NOTE — Telephone Encounter (Signed)
Done

## 2014-05-08 ENCOUNTER — Encounter: Payer: Self-pay | Admitting: Internal Medicine

## 2014-10-16 ENCOUNTER — Other Ambulatory Visit: Payer: Self-pay | Admitting: *Deleted

## 2014-10-16 MED ORDER — ESCITALOPRAM OXALATE 10 MG PO TABS: 10.0000 mg | ORAL_TABLET | Freq: Every day | ORAL | Status: DC

## 2014-10-30 ENCOUNTER — Other Ambulatory Visit: Payer: Self-pay

## 2014-10-30 ENCOUNTER — Other Ambulatory Visit: Payer: Self-pay | Admitting: Internal Medicine

## 2014-10-30 MED ORDER — LOVASTATIN 40 MG PO TABS: ORAL_TABLET | ORAL | Status: DC

## 2014-10-30 NOTE — Telephone Encounter (Signed)
Rx sent to pharmacy by escript  

## 2014-10-30 NOTE — Telephone Encounter (Signed)
The patient called and is hoping to get an update on her medications.  She stated she left a message for on the triage line, so i apologize if this is a repeat msg.  She is hoping to get a refill on her lexapro and lovastatin sent to [facility].

## 2014-11-14 ENCOUNTER — Other Ambulatory Visit: Payer: Self-pay | Admitting: Internal Medicine

## 2014-11-14 DIAGNOSIS — Z79899 Other long term (current) drug therapy: Secondary | ICD-10-CM

## 2014-11-14 NOTE — Telephone Encounter (Signed)
Pt is wanting to stop taking the medication.  She is requesting whether she has to wean off the medication or just stop after taking the last 3 tablets.  Please advise

## 2014-11-14 NOTE — Telephone Encounter (Signed)
Last OV 4.14.15, last refill 11.18.15, next OV 4.15.16.  Please advise refill

## 2014-11-14 NOTE — Telephone Encounter (Signed)
Refill denied because she is overdue for 6 month 15 minute  follow up on cholesterol medication use and SSRi use. Needs to have CMET done prior to OV

## 2014-11-14 NOTE — Telephone Encounter (Signed)
If she has two tablets left, i would cut them in half and take half daily for the next 6 days,  Then stop.

## 2014-11-15 NOTE — Telephone Encounter (Signed)
Spoke with pt, advised of MDs message.  Pt verbalized understanding 

## 2015-02-25 ENCOUNTER — Telehealth: Payer: Self-pay

## 2015-02-25 NOTE — Telephone Encounter (Signed)
The patient called hoping to get an rx of a low dose hormone called in.  She stated this was discussed at her last ov.

## 2015-02-25 NOTE — Telephone Encounter (Signed)
Last OV 03/26/14 MD notes discusses: "Discussed changing citalopram to lexapro for alleviation of hot flashes" Please advise.

## 2015-02-25 NOTE — Telephone Encounter (Signed)
Will need appt  To discuss HrT ,  Risks and benefits of hormone therapy in women with a uterus

## 2015-02-25 NOTE — Telephone Encounter (Signed)
Patient notified stated she has an up coming appointment. Will discuss at that time.

## 2015-03-28 ENCOUNTER — Ambulatory Visit (INDEPENDENT_AMBULATORY_CARE_PROVIDER_SITE_OTHER): Payer: No Typology Code available for payment source | Admitting: Internal Medicine

## 2015-03-28 ENCOUNTER — Encounter: Payer: Self-pay | Admitting: Internal Medicine

## 2015-03-28 VITALS — BP 118/74 | HR 74 | Temp 97.7°F | Resp 14 | Ht 63.0 in | Wt 144.5 lb

## 2015-03-28 DIAGNOSIS — Z13220 Encounter for screening for lipoid disorders: Secondary | ICD-10-CM | POA: Diagnosis not present

## 2015-03-28 DIAGNOSIS — R5383 Other fatigue: Secondary | ICD-10-CM | POA: Diagnosis not present

## 2015-03-28 DIAGNOSIS — E785 Hyperlipidemia, unspecified: Secondary | ICD-10-CM

## 2015-03-28 DIAGNOSIS — R197 Diarrhea, unspecified: Secondary | ICD-10-CM

## 2015-03-28 DIAGNOSIS — Z0000 Encounter for general adult medical examination without abnormal findings: Secondary | ICD-10-CM

## 2015-03-28 DIAGNOSIS — Z1159 Encounter for screening for other viral diseases: Secondary | ICD-10-CM

## 2015-03-28 DIAGNOSIS — Z1239 Encounter for other screening for malignant neoplasm of breast: Secondary | ICD-10-CM

## 2015-03-28 DIAGNOSIS — E559 Vitamin D deficiency, unspecified: Secondary | ICD-10-CM | POA: Diagnosis not present

## 2015-03-28 DIAGNOSIS — Z79899 Other long term (current) drug therapy: Secondary | ICD-10-CM

## 2015-03-28 DIAGNOSIS — Z1211 Encounter for screening for malignant neoplasm of colon: Secondary | ICD-10-CM

## 2015-03-28 DIAGNOSIS — Z124 Encounter for screening for malignant neoplasm of cervix: Secondary | ICD-10-CM

## 2015-03-28 LAB — COMPREHENSIVE METABOLIC PANEL
ALK PHOS: 79 U/L (ref 39–117)
ALT: 23 U/L (ref 0–35)
AST: 24 U/L (ref 0–37)
Albumin: 4 g/dL (ref 3.5–5.2)
BUN: 21 mg/dL (ref 6–23)
CO2: 30 mEq/L (ref 19–32)
CREATININE: 0.74 mg/dL (ref 0.40–1.20)
Calcium: 9.5 mg/dL (ref 8.4–10.5)
Chloride: 102 mEq/L (ref 96–112)
GFR: 83.75 mL/min (ref >60.00)
Glucose, Bld: 85 mg/dL (ref 70–99)
Potassium: 4.8 mEq/L (ref 3.5–5.1)
Sodium: 138 mEq/L (ref 135–145)
Total Bilirubin: 0.5 mg/dL (ref 0.2–1.2)
Total Protein: 6.6 g/dL (ref 6.0–8.3)

## 2015-03-28 LAB — LIPID PANEL
CHOLESTEROL: 185 mg/dL (ref 0–200)
HDL: 61.5 mg/dL (ref >39.00)
LDL Cholesterol: 101 mg/dL — ABNORMAL HIGH (ref 0–99)
NONHDL: 123.5
Total CHOL/HDL Ratio: 3
Triglycerides: 114 mg/dL (ref 0.0–149.0)
VLDL: 22.8 mg/dL (ref 0.0–40.0)

## 2015-03-28 LAB — CBC WITH DIFFERENTIAL/PLATELET
Basophils Absolute: 0 10*3/uL (ref 0.0–0.1)
Basophils Relative: 0.5 % (ref 0.0–3.0)
EOS PCT: 2.9 % (ref 0.0–5.0)
Eosinophils Absolute: 0.1 10*3/uL (ref 0.0–0.7)
HEMATOCRIT: 38.8 % (ref 36.0–46.0)
Hemoglobin: 13.2 g/dL (ref 12.0–15.0)
LYMPHS ABS: 1 10*3/uL (ref 0.7–4.0)
Lymphocytes Relative: 25 % (ref 12.0–46.0)
MCHC: 34 g/dL (ref 30.0–36.0)
MCV: 93.1 fl (ref 78.0–100.0)
MONOS PCT: 8.2 % (ref 3.0–12.0)
Monocytes Absolute: 0.3 10*3/uL (ref 0.1–1.0)
Neutro Abs: 2.5 10*3/uL (ref 1.4–7.7)
Neutrophils Relative %: 63.4 % (ref 43.0–77.0)
Platelets: 301 10*3/uL (ref 150.0–400.0)
RBC: 4.17 Mil/uL (ref 3.87–5.11)
RDW: 13.8 % (ref 11.5–15.5)
WBC: 3.9 10*3/uL — AB (ref 4.0–10.5)

## 2015-03-28 LAB — TSH: TSH: 3.03 u[IU]/mL (ref 0.35–4.50)

## 2015-03-28 LAB — VITAMIN D 25 HYDROXY (VIT D DEFICIENCY, FRACTURES): VITD: 28.75 ng/mL — ABNORMAL LOW (ref 30.00–100.00)

## 2015-03-28 LAB — HEPATITIS C ANTIBODY: HCV Ab: NEGATIVE

## 2015-03-28 NOTE — Progress Notes (Signed)
Patient ID: Alexis James, female   DOB: Jan 13, 1951, 64 y.o.   MRN: [id number]  Subjective:     Alexis James is a 64 y.o. female and is here for a comprehensive physical exam. The patient reports an episode of diarrhea.  History   Social History  . Marital Status: Married    Spouse Name: N/A  . Number of Children: 3  . Years of Education: N/A   Occupational History  . Not on file.   Social History Main Topics  . Smoking status: Passive Smoke Exposure - Never Smoker  . Smokeless tobacco: Never Used     Comment: Husband smoked x 38 years (quit 4 years ago)  . Alcohol Use: Yes     Comment: Occasional  . Drug Use: No  . Sexual Activity: Not on file   Other Topics Concern  . Not on file   Social History Narrative   Health Maintenance  Topic Date Due  . HIV Screening  05/17/1965  . TETANUS/TDAP  05/17/1969  . COLONOSCOPY  02/15/2015  . INFLUENZA VACCINE  07/14/2015  . MAMMOGRAM  04/17/2016  . ZOSTAVAX  Completed    The following portions of the patient's history were reviewed and updated as appropriate: allergies, current medications, past family history, past medical history, past social history, past surgical history and problem list.  Review of Systems A comprehensive review of systems was negative.   Objective:   BP 118/74 mmHg  Pulse 74  Temp(Src) 97.7 F (36.5 C) (Oral)  Resp 14  Ht 5\' 3"  (1.6 m)  Wt 144 lb 8 oz (65.545 kg)  BMI 25.60 kg/m2  SpO2 99%  General appearance: alert, cooperative and appears stated age Head: Normocephalic, without obvious abnormality, atraumatic Eyes: conjunctivae/corneas clear. PERRL, EOM's intact. Fundi benign. Ears: normal TM's and external ear canals both ears Nose: Nares normal. Septum midline. Mucosa normal. No drainage or sinus tenderness. Throat: lips, mucosa, and tongue normal; teeth and gums normal Neck: no adenopathy, no carotid bruit, no JVD, supple, symmetrical, trachea midline and thyroid not enlarged, symmetric,  no tenderness/mass/nodules Lungs: clear to auscultation bilaterally Breasts: normal appearance, no masses or tenderness Heart: regular rate and rhythm, S1, S2 normal, no murmur, click, rub or gallop Abdomen: soft, non-tender; bowel sounds normal; no masses,  no organomegaly Extremities: extremities normal, atraumatic, no cyanosis or edema Pulses: 2+ and symmetric Skin: Skin color, texture, turgor normal. No rashes or lesions Neurologic: Alert and oriented X 3, normal strength and tone. Normal symmetric reflexes. Normal coordination and gait.     Assessment and Plan:    Problem List Items Addressed This Visit      Unprioritized   Screening for colon cancer    Her 10 yr screening colonoscopy is due this year.       Relevant Orders   Ambulatory referral to General Surgery   Screening for cervical cancer    Her last 3 PAP smears have been normal,,  Last one March 2014 at age 46.  Will repeat next year as her last one.       Routine general medical examination at a health care facility    Annual wellness  exam was done as well as a comprehensive physical exam and management of acute and chronic conditions .  During the course of the visit the patient was educated and counseled about appropriate screening and preventive services including :  diabetes screening, lipid analysis with projected  10 year  risk for CAD , nutrition counseling,  colorectal cancer screening, and recommended immunizations.  Printed recommendations for health maintenance screenings was given.       Hyperlipidemia    She is tolerating lovasatatin for CAD risk reduction  Without side effects and liver enzymes are normal. No changes today.  Lab Results  Component Value Date   CHOL 185 03/28/2015   HDL 61.50 03/28/2015   LDLCALC 101* 03/28/2015   LDLDIRECT 94.3 03/26/2014   TRIG 114.0 03/28/2015   CHOLHDL 3 03/28/2015   Lab Results  Component Value Date   ALT 23 03/28/2015   AST 24 03/28/2015   ALKPHOS 79  03/28/2015   BILITOT 0.5 03/28/2015              Diarrhea    Discussed her recent use of amoxicillin and her now resolved diarrhea.  Daily use of Probiotics for  3 weeks advised to reduce risk of C dificile colitis.        Other Visit Diagnoses    Screening for hyperlipidemia    -  Primary    Relevant Orders    Lipid panel (Completed)    Vit D  25 hydroxy (rtn osteoporosis monitoring) (Completed)    Need for hepatitis C screening test        Relevant Orders    Hepatitis C antibody (Completed)    Other fatigue        Relevant Orders    Comprehensive metabolic panel    CBC with Differential/Platelet (Completed)    TSH (Completed)    Vitamin D deficiency        Relevant Orders    Vit D  25 hydroxy (rtn osteoporosis monitoring) (Completed)    Breast cancer screening        Relevant Orders    MM DIGITAL SCREENING BILATERAL    Long-term use of high-risk medication

## 2015-03-28 NOTE — Progress Notes (Signed)
Pre-visit discussion using our clinic review tool. No additional management support is needed unless otherwise documented below in the visit note.  

## 2015-03-28 NOTE — Patient Instructions (Addendum)
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 Talk with your health care provider about what schedule of regular examinations is right for you. This is a good chance for you to check in with your provider about disease prevention and staying healthy. In between checkups, there are plenty of things you can do on your own. Experts have done a lot of research about which lifestyle changes and preventive measures are most likely to keep you healthy. Ask your health care provider for more information. WEIGHT AND DIET  Eat a healthy diet  Be sure to include plenty of vegetables, fruits, low-fat dairy products, and lean protein.  Do not eat a lot of foods high in solid fats, added sugars, or salt.  Get regular exercise. This is one of the most important things you can do for your health.  Most adults should exercise for at least 150 minutes each week. The exercise should increase your heart rate and make you sweat (moderate-intensity exercise).  Most adults should also do strengthening exercises at least twice a week. This is in addition to the moderate-intensity exercise.  Maintain a healthy weight  Body mass index (BMI) is a measurement that can be used to identify possible weight problems. It estimates body fat based on height and weight. Your health care provider can help determine your BMI and help you achieve or maintain a healthy weight.  For females 20 years of age and older:   A BMI below 18.5  is considered underweight.  A BMI of 18.5 to 24.9 is normal.  A BMI of 25 to 29.9 is considered overweight.  A BMI of 30 and above is considered obese.  Watch levels of cholesterol and blood lipids  You should start having your blood tested for lipids and cholesterol at 65 years of age, then have this test every 5 years.  You may need to have your cholesterol levels checked more often if:  Your lipid or cholesterol levels are high.  You are older than 65 years of age.  You are at high risk for heart disease.  CANCER SCREENING   Lung Cancer  Lung cancer screening is recommended for adults 55-80 years old who are at high risk for lung cancer because of a history of smoking.  A yearly low-dose CT scan of the lungs is recommended for people who:  Currently smoke.  Have quit within the past 15 years.  Have at least a 30-pack-year history of smoking. A pack year is smoking an average of one pack of cigarettes a day for 1 year.  Yearly screening should continue until it has been 15 years since you quit.  Yearly screening should stop if you develop a health problem that would prevent you from having lung cancer treatment.  Breast Cancer  Practice breast self-awareness. This means understanding how your breasts normally appear and feel.  It also means doing regular breast self-exams. Let your health care provider know about   any changes, no matter how small.  If you are in your 20s or 30s, you should have a clinical breast exam (CBE) by a health care provider every 1-3 years as part of a regular health exam.  If you are 40 or older, have a CBE every year. Also consider having a breast X-ray (mammogram) every year.  If you have a family history of breast cancer, talk to your health care provider about genetic screening.  If you are at high risk for breast cancer, talk to your health care provider about having an MRI and a mammogram every year.  Breast cancer gene (BRCA)  assessment is recommended for women who have family members with BRCA-related cancers. BRCA-related cancers include:  Breast.  Ovarian.  Tubal.  Peritoneal cancers.  Results of the assessment will determine the need for genetic counseling and BRCA1 and BRCA2 testing. Cervical Cancer Routine pelvic examinations to screen for cervical cancer are no longer recommended for nonpregnant women who are considered low risk for cancer of the pelvic organs (ovaries, uterus, and vagina) and who do not have symptoms. A pelvic examination may be necessary if you have symptoms including those associated with pelvic infections. Ask your health care provider if a screening pelvic exam is right for you.   The Pap test is the screening test for cervical cancer for women who are considered at risk.  If you had a hysterectomy for a problem that was not cancer or a condition that could lead to cancer, then you no longer need Pap tests.  If you are older than 65 years, and you have had normal Pap tests for the past 10 years, you no longer need to have Pap tests.  If you have had past treatment for cervical cancer or a condition that could lead to cancer, you need Pap tests and screening for cancer for at least 20 years after your treatment.  If you no longer get a Pap test, assess your risk factors if they change (such as having a new sexual partner). This can affect whether you should start being screened again.  Some women have medical problems that increase their chance of getting cervical cancer. If this is the case for you, your health care provider may recommend more frequent screening and Pap tests.  The human papillomavirus (HPV) test is another test that may be used for cervical cancer screening. The HPV test looks for the virus that can cause cell changes in the cervix. The cells collected during the Pap test can be tested for HPV.  The HPV test can be used to screen women 30 years of age and older.  Getting tested for HPV can extend the interval between normal Pap tests from three to five years.  An HPV test also should be used to screen women of any age who have unclear Pap test results.  After 65 years of age, women should have HPV testing as often as Pap tests.  Colorectal Cancer  This type of cancer can be detected and often prevented.  Routine colorectal cancer screening usually begins at 65 years of age and continues through 65 years of age.  Your health care provider may recommend screening at an earlier age if you have risk factors for colon cancer.  Your health care provider may also recommend using home test kits to check for hidden blood in the stool.  A small camera at the end of a tube can be used to examine your colon directly (sigmoidoscopy or   colonoscopy). This is done to check for the earliest forms of colorectal cancer.  Routine screening usually begins at age 50.  Direct examination of the colon should be repeated every 5-10 years through 65 years of age. However, you may need to be screened more often if early forms of precancerous polyps or small growths are found. Skin Cancer  Check your skin from head to toe regularly.  Tell your health care provider about any new moles or changes in moles, especially if there is a change in a mole's shape or color.  Also tell your health care provider if you have a mole that is larger than the size of a pencil eraser.  Always use sunscreen. Apply sunscreen liberally and repeatedly throughout the day.  Protect yourself by wearing long sleeves, pants, a wide-brimmed hat, and sunglasses whenever you are outside. HEART DISEASE, DIABETES, AND HIGH BLOOD PRESSURE   Have your blood pressure checked at least every 1-2 years. High blood pressure causes heart disease and increases the risk of stroke.  If you are between 55 years and 79 years old, ask your health care provider if you should take aspirin to prevent  strokes.  Have regular diabetes screenings. This involves taking a blood sample to check your fasting blood sugar level.  If you are at a normal weight and have a low risk for diabetes, have this test once every three years after 65 years of age.  If you are overweight and have a high risk for diabetes, consider being tested at a younger age or more often. PREVENTING INFECTION  Hepatitis B  If you have a higher risk for hepatitis B, you should be screened for this virus. You are considered at high risk for hepatitis B if:  You were born in a country where hepatitis B is common. Ask your health care provider which countries are considered high risk.  Your parents were born in a high-risk country, and you have not been immunized against hepatitis B (hepatitis B vaccine).  You have HIV or AIDS.  You use needles to inject street drugs.  You live with someone who has hepatitis B.  You have had sex with someone who has hepatitis B.  You get hemodialysis treatment.  You take certain medicines for conditions, including cancer, organ transplantation, and autoimmune conditions. Hepatitis C  Blood testing is recommended for:  Everyone born from 1945 through 1965.  Anyone with known risk factors for hepatitis C. Sexually transmitted infections (STIs)  You should be screened for sexually transmitted infections (STIs) including gonorrhea and chlamydia if:  You are sexually active and are younger than 65 years of age.  You are older than 65 years of age and your health care provider tells you that you are at risk for this type of infection.  Your sexual activity has changed since you were last screened and you are at an increased risk for chlamydia or gonorrhea. Ask your health care provider if you are at risk.  If you do not have HIV, but are at risk, it may be recommended that you take a prescription medicine daily to prevent HIV infection. This is called pre-exposure prophylaxis  (PrEP). You are considered at risk if:  You are sexually active and do not regularly use condoms or know the HIV status of your partner(s).  You take drugs by injection.  You are sexually active with a partner who has HIV. Talk with your health care provider about whether you are at high risk   of being infected with HIV. If you choose to begin PrEP, you should first be tested for HIV. You should then be tested every 3 months for as long as you are taking PrEP.  PREGNANCY   If you are premenopausal and you may become pregnant, ask your health care provider about preconception counseling.  If you may become pregnant, take 400 to 800 micrograms (mcg) of folic acid every day.  If you want to prevent pregnancy, talk to your health care provider about birth control (contraception). OSTEOPOROSIS AND MENOPAUSE   Osteoporosis is a disease in which the bones lose minerals and strength with aging. This can result in serious bone fractures. Your risk for osteoporosis can be identified using a bone density scan.  If you are 40 years of age or older, or if you are at risk for osteoporosis and fractures, ask your health care provider if you should be screened.  Ask your health care provider whether you should take a calcium or vitamin D supplement to lower your risk for osteoporosis.  Menopause may have certain physical symptoms and risks.  Hormone replacement therapy may reduce some of these symptoms and risks. Talk to your health care provider about whether hormone replacement therapy is right for you.  HOME CARE INSTRUCTIONS   Schedule regular health, dental, and eye exams.  Stay current with your immunizations.   Do not use any tobacco products including cigarettes, chewing tobacco, or electronic cigarettes.  If you are pregnant, do not drink alcohol.  If you are breastfeeding, limit how much and how often you drink alcohol.  Limit alcohol intake to no more than 1 drink per day for  nonpregnant women. One drink equals 12 ounces of beer, 5 ounces of wine, or 1 ounces of hard liquor.  Do not use street drugs.  Do not share needles.  Ask your health care provider for help if you need support or information about quitting drugs.  Tell your health care provider if you often feel depressed.  Tell your health care provider if you have ever been abused or do not feel safe at home. Document Released: 06/14/2011 Document Revised: 04/15/2014 Document Reviewed: 10/31/2013 [facility] Patient Information 2015 [place], [place]. This information is not intended to replace advice given to you by your health care provider. Make sure you discuss any questions you have with your health care provider.

## 2015-03-30 ENCOUNTER — Encounter: Payer: Self-pay | Admitting: Internal Medicine

## 2015-03-30 NOTE — Assessment & Plan Note (Signed)
She is tolerating lovasatatin for CAD risk reduction  Without side effects and liver enzymes are normal. No changes today.  Lab Results  Component Value Date   CHOL 185 03/28/2015   HDL 61.50 03/28/2015   LDLCALC 101* 03/28/2015   LDLDIRECT 94.3 03/26/2014   TRIG 114.0 03/28/2015   CHOLHDL 3 03/28/2015   Lab Results  Component Value Date   ALT 23 03/28/2015   AST 24 03/28/2015   ALKPHOS 79 03/28/2015   BILITOT 0.5 03/28/2015

## 2015-03-30 NOTE — Assessment & Plan Note (Signed)

## 2015-03-30 NOTE — Assessment & Plan Note (Signed)
Her 10 yr screening colonoscopy is due this year.

## 2015-03-30 NOTE — Assessment & Plan Note (Signed)
Discussed her recent use of amoxicillin and her now resolved diarrhea.  Daily use of Probiotics for  3 weeks advised to reduce risk of C dificile colitis.

## 2015-03-30 NOTE — Assessment & Plan Note (Signed)
Her last 3 PAP smears have been normal,,  Last one March 2014 at age 64.  Will repeat next year as her last one.

## 2015-04-09 ENCOUNTER — Telehealth: Payer: Self-pay

## 2015-04-09 NOTE — Telephone Encounter (Signed)
Saw MD 03/28/15 just need to schedule.

## 2015-04-09 NOTE — Telephone Encounter (Signed)
The patient called and is hoping to get her ears cleaned.  Does she need to see a provider before this procedure can be done?

## 2015-04-17 ENCOUNTER — Ambulatory Visit (INDEPENDENT_AMBULATORY_CARE_PROVIDER_SITE_OTHER): Payer: No Typology Code available for payment source | Admitting: *Deleted

## 2015-04-17 DIAGNOSIS — Z23 Encounter for immunization: Secondary | ICD-10-CM | POA: Diagnosis not present

## 2015-04-17 NOTE — Progress Notes (Signed)
Pt presents for right ear irrigation. Has used Debrox at home x 2 days. Irrigated right ear without difficulty, pt tolerated well, very little cerumen returned. Difficult to visualize in ear canal. Lorane Gell, NP came into check right ear, still with cerumen impaction. Morey Hummingbird offered pt to try Debrox ear irrigation at home or may have referral to ENT. Pt will think about it and call office back if she would like to proceed with ENT referral.

## 2015-04-21 ENCOUNTER — Ambulatory Visit: Payer: Self-pay | Admitting: General Surgery

## 2015-04-29 LAB — HM MAMMOGRAPHY

## 2015-05-01 ENCOUNTER — Encounter: Payer: Self-pay | Admitting: Internal Medicine

## 2015-05-01 ENCOUNTER — Telehealth: Payer: Self-pay | Admitting: Internal Medicine

## 2015-05-01 DIAGNOSIS — H6123 Impacted cerumen, bilateral: Secondary | ICD-10-CM

## 2015-05-01 DIAGNOSIS — H6121 Impacted cerumen, right ear: Secondary | ICD-10-CM

## 2015-05-01 NOTE — Telephone Encounter (Signed)
Patient came into office for ear irrigation on 04/17/15 and was advised her was completely impacted with cerumen and very little was removed. NP saw patient and advised to use debrox again and either return for irrigation or se ENT patient would like to see ENT. MD originally told patient to see nurse on Tuesday. Find note from ear irrigation posted below.  Patient also brought in a copy of colonoscopy from [place] from 2012. Placed in red folder and updated chart  Pt presents for right ear irrigation. Has used Debrox at home x 2 days. Irrigated right ear without difficulty, pt tolerated well, very little cerumen returned. Difficult to visualize in ear canal. Lorane Gell, NP came into check right ear, still with cerumen impaction. Morey Hummingbird offered pt to try Debrox ear irrigation at home or may have referral to ENT. Pt will think about it and call office back if she would like to proceed with ENT referral.

## 2015-05-01 NOTE — Telephone Encounter (Signed)
Reviewed.  ENT referral in process

## 2015-05-02 NOTE — Telephone Encounter (Signed)
Patient notified and voiced understanding.

## 2015-05-08 ENCOUNTER — Other Ambulatory Visit: Payer: Self-pay | Admitting: *Deleted

## 2015-05-08 MED ORDER — LOVASTATIN 40 MG PO TABS: ORAL_TABLET | ORAL | Status: DC

## 2015-09-05 ENCOUNTER — Telehealth: Payer: Self-pay | Admitting: *Deleted

## 2015-09-05 MED ORDER — ESCITALOPRAM OXALATE 5 MG PO TABS
5.0000 mg | ORAL_TABLET | Freq: Every day | ORAL | Status: DC
Start: 2015-09-05 — End: 2015-12-30

## 2015-09-05 NOTE — Telephone Encounter (Signed)
Spoke with pt advised of Rx.  Verbalized understanding

## 2015-09-05 NOTE — Telephone Encounter (Signed)
5 mg lexapro (generic version) sent to pharmacy  For hot flashes.  Take in the evening

## 2015-09-05 NOTE — Telephone Encounter (Signed)
Pt called requesting a mid antidepressant to help with hot flashes.  Please advise

## 2015-10-03 ENCOUNTER — Other Ambulatory Visit: Payer: Self-pay | Admitting: Internal Medicine

## 2015-12-30 ENCOUNTER — Other Ambulatory Visit: Payer: Self-pay | Admitting: Internal Medicine

## 2015-12-30 NOTE — Telephone Encounter (Signed)
LAst OV was 03/28/2015.  Please advise refill?

## 2015-12-31 NOTE — Telephone Encounter (Signed)
Refill for 30 days only.  OFFICE VISIT NEEDED prior to any more refills 

## 2016-01-29 ENCOUNTER — Other Ambulatory Visit: Payer: Self-pay | Admitting: Internal Medicine

## 2016-01-30 NOTE — Telephone Encounter (Signed)
Refill for 60 days only.  OFFICE VISIT NEEDED, PLEASE  SCHEDULE HER WELLNESS EXAM IN IN April   prior to any more refills

## 2016-01-30 NOTE — Telephone Encounter (Signed)
Last OV was April 2016.  Please advise refill. thanks

## 2016-03-02 ENCOUNTER — Other Ambulatory Visit: Payer: Self-pay | Admitting: Internal Medicine

## 2016-04-02 ENCOUNTER — Ambulatory Visit (INDEPENDENT_AMBULATORY_CARE_PROVIDER_SITE_OTHER): Payer: PPO | Admitting: Internal Medicine

## 2016-04-02 ENCOUNTER — Other Ambulatory Visit (HOSPITAL_COMMUNITY)
Admission: RE | Admit: 2016-04-02 | Discharge: 2016-04-02 | Disposition: A | Discharge status: Home / Self Care | Payer: PPO | Source: Ambulatory Visit | Attending: Internal Medicine | Admitting: Internal Medicine

## 2016-04-02 ENCOUNTER — Encounter: Payer: Self-pay | Admitting: Internal Medicine

## 2016-04-02 VITALS — BP 104/60 | HR 71 | Temp 97.9°F | Resp 12 | Ht 63.0 in | Wt 140.0 lb

## 2016-04-02 DIAGNOSIS — Z1151 Encounter for screening for human papillomavirus (HPV): Secondary | ICD-10-CM | POA: Diagnosis not present

## 2016-04-02 DIAGNOSIS — Z7289 Other problems related to lifestyle: Secondary | ICD-10-CM

## 2016-04-02 DIAGNOSIS — Z23 Encounter for immunization: Secondary | ICD-10-CM

## 2016-04-02 DIAGNOSIS — E559 Vitamin D deficiency, unspecified: Secondary | ICD-10-CM

## 2016-04-02 DIAGNOSIS — Z1239 Encounter for other screening for malignant neoplasm of breast: Secondary | ICD-10-CM

## 2016-04-02 DIAGNOSIS — Z780 Asymptomatic menopausal state: Secondary | ICD-10-CM

## 2016-04-02 DIAGNOSIS — R5383 Other fatigue: Secondary | ICD-10-CM

## 2016-04-02 DIAGNOSIS — E785 Hyperlipidemia, unspecified: Secondary | ICD-10-CM

## 2016-04-02 DIAGNOSIS — Z124 Encounter for screening for malignant neoplasm of cervix: Secondary | ICD-10-CM

## 2016-04-02 DIAGNOSIS — Z01419 Encounter for gynecological examination (general) (routine) without abnormal findings: Secondary | ICD-10-CM | POA: Insufficient documentation

## 2016-04-02 DIAGNOSIS — Z0000 Encounter for general adult medical examination without abnormal findings: Secondary | ICD-10-CM

## 2016-04-02 LAB — CBC WITH DIFFERENTIAL/PLATELET
BASOS ABS: 0 10*3/uL (ref 0.0–0.1)
Basophils Relative: 0.5 % (ref 0.0–3.0)
EOS ABS: 0.1 10*3/uL (ref 0.0–0.7)
Eosinophils Relative: 3.3 % (ref 0.0–5.0)
HCT: 36.5 % (ref 36.0–46.0)
Hemoglobin: 12.3 g/dL (ref 12.0–15.0)
LYMPHS ABS: 0.9 10*3/uL (ref 0.7–4.0)
LYMPHS PCT: 25.8 % (ref 12.0–46.0)
MCHC: 33.8 g/dL (ref 30.0–36.0)
MCV: 93.1 fl (ref 78.0–100.0)
Monocytes Absolute: 0.3 10*3/uL (ref 0.1–1.0)
Monocytes Relative: 9.1 % (ref 3.0–12.0)
NEUTROS ABS: 2.2 10*3/uL (ref 1.4–7.7)
NEUTROS PCT: 61.3 % (ref 43.0–77.0)
PLATELETS: 362 10*3/uL (ref 150.0–400.0)
RBC: 3.92 Mil/uL (ref 3.87–5.11)
RDW: 14.1 % (ref 11.5–15.5)
WBC: 3.7 10*3/uL — ABNORMAL LOW (ref 4.0–10.5)

## 2016-04-02 LAB — COMPREHENSIVE METABOLIC PANEL
ALK PHOS: 69 U/L (ref 39–117)
ALT: 15 U/L (ref 0–35)
AST: 21 U/L (ref 0–37)
Albumin: 4 g/dL (ref 3.5–5.2)
BILIRUBIN TOTAL: 0.5 mg/dL (ref 0.2–1.2)
BUN: 18 mg/dL (ref 6–23)
CO2: 29 mEq/L (ref 19–32)
Calcium: 9.7 mg/dL (ref 8.4–10.5)
Chloride: 104 mEq/L (ref 96–112)
Creatinine, Ser: 0.8 mg/dL (ref 0.40–1.20)
GFR: 76.3 mL/min (ref >60.00)
GLUCOSE: 89 mg/dL (ref 70–99)
Potassium: 4.3 mEq/L (ref 3.5–5.1)
Sodium: 141 mEq/L (ref 135–145)
TOTAL PROTEIN: 6.7 g/dL (ref 6.0–8.3)

## 2016-04-02 LAB — LIPID PANEL
CHOLESTEROL: 183 mg/dL (ref 0–200)
HDL: 59.1 mg/dL (ref >39.00)
LDL Cholesterol: 105 mg/dL — ABNORMAL HIGH (ref 0–99)
NonHDL: 123.99
TRIGLYCERIDES: 97 mg/dL (ref 0.0–149.0)
Total CHOL/HDL Ratio: 3
VLDL: 19.4 mg/dL (ref 0.0–40.0)

## 2016-04-02 LAB — TSH: TSH: 2.04 u[IU]/mL (ref 0.35–4.50)

## 2016-04-02 LAB — VITAMIN D 25 HYDROXY (VIT D DEFICIENCY, FRACTURES): VITD: 33.29 ng/mL (ref 30.00–100.00)

## 2016-04-02 NOTE — Patient Instructions (Signed)
I am so sorry about the loss of your beloved mother Alexis James.  It was an honor to have known her  Please let me know if I can help in any way with your grief and insomnia  You received the Pneumonia vaccine today  Mammogram and Bone Density test have been ordered   Menopause is a normal process in which your reproductive ability comes to an end. This process happens gradually over a span of months to years, usually between the ages of 59 and 28. Menopause is complete when you have missed 12 consecutive menstrual periods. It is important to talk with your health care provider about some of the most common conditions that affect postmenopausal women, such as heart disease, cancer, and bone loss (osteoporosis). Adopting a healthy lifestyle and getting preventive care can help to promote your health and wellness. Those actions can also lower your chances of developing some of these common conditions. WHAT SHOULD I KNOW ABOUT MENOPAUSE? During menopause, you may experience a number of symptoms, such as:  Moderate-to-severe hot flashes.  Night sweats.  Decrease in sex drive.  Mood swings.  Headaches.  Tiredness.  Irritability.  Memory problems.  Insomnia. Choosing to treat or not to treat menopausal changes is an individual decision that you make with your health care provider. WHAT SHOULD I KNOW ABOUT HORMONE REPLACEMENT THERAPY AND SUPPLEMENTS? Hormone therapy products are effective for treating symptoms that are associated with menopause, such as hot flashes and night sweats. Hormone replacement carries certain risks, especially as you become older. If you are thinking about using estrogen or estrogen with progestin treatments, discuss the benefits and risks with your health care provider. WHAT SHOULD I KNOW ABOUT HEART DISEASE AND STROKE? Heart disease, heart attack, and stroke become more likely as you age. This may be due, in part, to the hormonal changes that your body experiences  during menopause. These can affect how your body processes dietary fats, triglycerides, and cholesterol. Heart attack and stroke are both medical emergencies. There are many things that you can do to help prevent heart disease and stroke:  Have your blood pressure checked at least every 1-2 years. High blood pressure causes heart disease and increases the risk of stroke.  If you are 71-45 years old, ask your health care provider if you should take aspirin to prevent a heart attack or a stroke.  Do not use any tobacco products, including cigarettes, chewing tobacco, or electronic cigarettes. If you need help quitting, ask your health care provider.  It is important to eat a healthy diet and maintain a healthy weight.  Be sure to include plenty of vegetables, fruits, low-fat dairy products, and lean protein.  Avoid eating foods that are high in solid fats, added sugars, or salt (sodium).  Get regular exercise. This is one of the most important things that you can do for your health.  Try to exercise for at least 150 minutes each week. The type of exercise that you do should increase your heart rate and make you sweat. This is known as moderate-intensity exercise.  Try to do strengthening exercises at least twice each week. Do these in addition to the moderate-intensity exercise.  Know your numbers.Ask your health care provider to check your cholesterol and your blood glucose. Continue to have your blood tested as directed by your health care provider. WHAT SHOULD I KNOW ABOUT CANCER SCREENING? There are several types of cancer. Take the following steps to reduce your risk and to catch any  cancer development as early as possible. Breast Cancer  Practice breast self-awareness.  This means understanding how your breasts normally appear and feel.  It also means doing regular breast self-exams. Let your health care provider know about any changes, no matter how small.  If you are 23 or  older, have a clinician do a breast exam (clinical breast exam or CBE) every year. Depending on your age, family history, and medical history, it may be recommended that you also have a yearly breast X-ray (mammogram).  If you have a family history of breast cancer, talk with your health care provider about genetic screening.  If you are at high risk for breast cancer, talk with your health care provider about having an MRI and a mammogram every year.  Breast cancer (BRCA) gene test is recommended for women who have family members with BRCA-related cancers. Results of the assessment will determine the need for genetic counseling and BRCA1 and for BRCA2 testing. BRCA-related cancers include these types:  Breast. This occurs in males or females.  Ovarian.  Tubal. This may also be called fallopian tube cancer.  Cancer of the abdominal or pelvic lining (peritoneal cancer).  Prostate.  Pancreatic. Cervical, Uterine, and Ovarian Cancer Your health care provider may recommend that you be screened regularly for cancer of the pelvic organs. These include your ovaries, uterus, and vagina. This screening involves a pelvic exam, which includes checking for microscopic changes to the surface of your cervix (Pap test).  For women ages 21-65, health care providers may recommend a pelvic exam and a Pap test every three years. For women ages 47-65, they may recommend the Pap test and pelvic exam, combined with testing for human papilloma virus (HPV), every five years. Some types of HPV increase your risk of cervical cancer. Testing for HPV may also be done on women of any age who have unclear Pap test results.  Other health care providers may not recommend any screening for nonpregnant women who are considered low risk for pelvic cancer and have no symptoms. Ask your health care provider if a screening pelvic exam is right for you.  If you have had past treatment for cervical cancer or a condition that  could lead to cancer, you need Pap tests and screening for cancer for at least 20 years after your treatment. If Pap tests have been discontinued for you, your risk factors (such as having a new sexual partner) need to be reassessed to determine if you should start having screenings again. Some women have medical problems that increase the chance of getting cervical cancer. In these cases, your health care provider may recommend that you have screening and Pap tests more often.  If you have a family history of uterine cancer or ovarian cancer, talk with your health care provider about genetic screening.  If you have vaginal bleeding after reaching menopause, tell your health care provider.  There are currently no reliable tests available to screen for ovarian cancer. Lung Cancer Lung cancer screening is recommended for adults 62-92 years old who are at high risk for lung cancer because of a history of smoking. A yearly low-dose CT scan of the lungs is recommended if you:  Currently smoke.  Have a history of at least 30 pack-years of smoking and you currently smoke or have quit within the past 15 years. A pack-year is smoking an average of one pack of cigarettes per day for one year. Yearly screening should:  Continue until it has been  15 years since you quit.  Stop if you develop a health problem that would prevent you from having lung cancer treatment. Colorectal Cancer  This type of cancer can be detected and can often be prevented.  Routine colorectal cancer screening usually begins at age 79 and continues through age 24.  If you have risk factors for colon cancer, your health care provider may recommend that you be screened at an earlier age.  If you have a family history of colorectal cancer, talk with your health care provider about genetic screening.  Your health care provider may also recommend using home test kits to check for hidden blood in your stool.  A small camera at the  end of a tube can be used to examine your colon directly (sigmoidoscopy or colonoscopy). This is done to check for the earliest forms of colorectal cancer.  Direct examination of the colon should be repeated every 5-10 years until age 39. However, if early forms of precancerous polyps or small growths are found or if you have a family history or genetic risk for colorectal cancer, you may need to be screened more often. Skin Cancer  Check your skin from head to toe regularly.  Monitor any moles. Be sure to tell your health care provider:  About any new moles or changes in moles, especially if there is a change in a mole's shape or color.  If you have a mole that is larger than the size of a pencil eraser.  If any of your family members has a history of skin cancer, especially at a young age, talk with your health care provider about genetic screening.  Always use sunscreen. Apply sunscreen liberally and repeatedly throughout the day.  Whenever you are outside, protect yourself by wearing long sleeves, pants, a wide-brimmed hat, and sunglasses. WHAT SHOULD I KNOW ABOUT OSTEOPOROSIS? Osteoporosis is a condition in which bone destruction happens more quickly than new bone creation. After menopause, you may be at an increased risk for osteoporosis. To help prevent osteoporosis or the bone fractures that can happen because of osteoporosis, the following is recommended:  If you are 74-34 years old, get at least 1,000 mg of calcium and at least 600 mg of vitamin D per day.  If you are older than age 28 but younger than age 38, get at least 1,200 mg of calcium and at least 600 mg of vitamin D per day.  If you are older than age 45, get at least 1,200 mg of calcium and at least 800 mg of vitamin D per day. Smoking and excessive alcohol intake increase the risk of osteoporosis. Eat foods that are rich in calcium and vitamin D, and do weight-bearing exercises several times each week as directed by  your health care provider. WHAT SHOULD I KNOW ABOUT HOW MENOPAUSE AFFECTS [place]? Depression may occur at any age, but it is more common as you become older. Common symptoms of depression include:  Low or sad mood.  Changes in sleep patterns.  Changes in appetite or eating patterns.  Feeling an overall lack of motivation or enjoyment of activities that you previously enjoyed.  Frequent crying spells. Talk with your health care provider if you think that you are experiencing depression. WHAT SHOULD I KNOW ABOUT IMMUNIZATIONS? It is important that you get and maintain your immunizations. These include:  Tetanus, diphtheria, and pertussis (Tdap) booster vaccine.  Influenza every year before the flu season begins.  Pneumonia vaccine.  Shingles vaccine. Your  health care provider may also recommend other immunizations.   This information is not intended to replace advice given to you by your health care provider. Make sure you discuss any questions you have with your health care provider.   Document Released: 01/21/2006 Document Revised: 12/20/2014 Document Reviewed: 08/01/2014 Elsevier Interactive Patient Education [employer].

## 2016-04-02 NOTE — Progress Notes (Signed)
Patient ID: Alexis James, female    DOB: 06-20-1951  Age: 65 y.o. MRN: [id number]  The patient is here for first ([facility]) wellness examination and management of other chronic and acute problems.  Colonoscopy 2012 normal no FH Mammogram PAP done today    The risk factors are reflected in the social history.  The roster of all physicians providing medical care to patient - is listed in the Snapshot section of the chart.  Activities of daily living:  The patient is 100% independent in all ADLs: dressing, toileting, feeding as well as independent mobility  Home safety : The patient has smoke detectors in the home. They wear seatbelts.  There are no firearms at home. There is no violence in the home.   There is no risks for hepatitis, STDs or HIV. There is no   history of blood transfusion. They have no travel history to infectious disease endemic areas of the world.  The patient has seen their dentist in the last six month. They have seen their eye doctor in the last year. They admit to slight hearing difficulty with regard to whispered voices and some television programs.  They have deferred audiologic testing in the last year.  They do not  have excessive sun exposure. Discussed the need for sun protection: hats, long sleeves and use of sunscreen if there is significant sun exposure.   Diet: the importance of a healthy diet is discussed. They do have a healthy diet.  The benefits of regular aerobic exercise were discussed. She walks 4 times per week ,  20 minutes.   Depression screen: there are no signs or vegative symptoms of depression- irritability, change in appetite, anhedonia, sadness/tearfullness.  Cognitive assessment: the patient manages all their financial and personal affairs and is actively engaged. They could relate day,date,year and events; recalled 2/3 objects at 3 minutes; performed clock-face test normally.  The following portions of the patient's history were  reviewed and updated as appropriate: allergies, current medications, past family history, past medical history,  past surgical history, past social history  and problem list.  Visual acuity was not assessed per patient preference since she has regular follow up with her ophthalmologist. Hearing and body mass index were assessed and reviewed.   During the course of the visit , End of Life objectives were discussed at length,  Patient does not have a living will in place or a healthcare power of attorney.  She was given printed information about advance directives and encouraged to return after discussing with her family,    During the course of the visit the patient was educated and counseled about appropriate screening and preventive services including : fall prevention , diabetes screening, nutrition counseling, colorectal cancer screening, and recommended immunizations.    CC: The primary encounter diagnosis was Postmenopausal estrogen deficiency. Diagnoses of Hyperlipidemia, mild, Other fatigue, Other problems related to lifestyle, Cervical cancer screening, Vitamin D deficiency, Breast cancer screening, Need for prophylactic vaccination against Streptococcus pneumoniae (pneumococcus), Routine general medical examination at a health care facility, and Hyperlipidemia were also pertinent to this visit.  Grieving the  loss of mother Alexis James which occurred one month ago after a fall with head injury leading to loss of independent living status.   Insomnia.  Using Sleep aid. . Habits reviewed .  She is using I pad up until bedtime watching a series.    History Alexis James has a past medical history of Anxiety and Hyperlipidemia.   She has  past surgical history that includes Breast surgery.   Her family history includes Mental retardation (age of onset: 97) in her father. There is no history of Cancer.She reports that she has been passively smoking.  She has never used smokeless tobacco. She  reports that she drinks alcohol. She reports that she does not use illicit drugs.  Outpatient Prescriptions Prior to Visit  Medication Sig Dispense Refill  . aspirin EC 81 MG tablet Take 81 mg by mouth 2 (two) times a week.     . escitalopram (LEXAPRO) 5 MG tablet TAKE ONE TABLET BY MOUTH ONCE DAILY 30 tablet 1  . lovastatin (MEVACOR) 40 MG tablet TAKE ONE TABLET BY MOUTH ONCE DAILY AT BEDTIME 90 tablet 0  . Multiple Vitamin (MULTIVITAMIN) tablet Take 1 tablet by mouth daily.    . calcium-vitamin D (OSCAL) 250-125 MG-UNIT per tablet Take 1 tablet by mouth daily.       No facility-administered medications prior to visit.    Review of Systems   Patient denies headache, fevers, malaise, unintentional weight loss, skin rash, eye pain, sinus congestion and sinus pain, sore throat, dysphagia,  hemoptysis , cough, dyspnea, wheezing, chest pain, palpitations, orthopnea, edema, abdominal pain, nausea, melena, diarrhea, constipation, flank pain, dysuria, hematuria, urinary  Frequency, nocturia, numbness, tingling, seizures,  Focal weakness, Loss of consciousness,  Tremor, insomnia, depression, anxiety, and suicidal ideation.      Objective:  BP 104/60 mmHg  Pulse 71  Temp(Src) 97.9 F (36.6 C) (Oral)  Resp 12  Ht 5\' 3"  (1.6 m)  Wt 140 lb (63.504 kg)  BMI 24.81 kg/m2  SpO2 97%  Physical Exam   General appearance: alert, cooperative and appears stated age Head: Normocephalic, without obvious abnormality, atraumatic Eyes: conjunctivae/corneas clear. PERRL, EOM's intact. Fundi benign. Ears: normal TM's and external ear canals both ears Nose: Nares normal. Septum midline. Mucosa normal. No drainage or sinus tenderness. Throat: lips, mucosa, and tongue normal; teeth and gums normal Neck: no adenopathy, no carotid bruit, no JVD, supple, symmetrical, trachea midline and thyroid not enlarged, symmetric, no tenderness/mass/nodules Lungs: clear to auscultation bilaterally Breasts: normal  appearance, no masses or tenderness Heart: regular rate and rhythm, S1, S2 normal, no murmur, click, rub or gallop Abdomen: soft, non-tender; bowel sounds normal; no masses,  no organomegaly Extremities: extremities normal, atraumatic, no cyanosis or edema Pulses: 2+ and symmetric Skin: Skin color, texture, turgor normal. No rashes or lesions Neurologic: Alert and oriented X 3, normal strength and tone. Normal symmetric reflexes. Normal coordination and gait.     Assessment & Plan:   Problem List Items Addressed This Visit    Routine general medical examination at a health care facility    Annual Medicare wellness  exam was done as well as a comprehensive physical exam and management of acute and chronic conditions .  During the course of the visit the patient was educated and counseled about appropriate screening and preventive services including : fall prevention , diabetes screening, nutrition counseling, colorectal cancer screening, and recommended immunizations.  Printed recommendations for health maintenance screenings was given.   Lab Results  Component Value Date   CHOL 183 04/02/2016   HDL 59.10 04/02/2016   LDLCALC 105* 04/02/2016   LDLDIRECT 94.3 03/26/2014   TRIG 97.0 04/02/2016   CHOLHDL 3 04/02/2016   Lab Results  Component Value Date   TSH 2.04 04/02/2016   Lab Results  Component Value Date   NA 141 04/02/2016   K 4.3 04/02/2016   CL 104  04/02/2016   CO2 29 04/02/2016         Hyperlipidemia    LDL and triglycerides are at goal on current medications. She has no side effects and liver enzymes are normal. No changes today        Other Visit Diagnoses    Postmenopausal estrogen deficiency    -  Primary    Relevant Orders    DG Bone Density    Hyperlipidemia, mild        Relevant Orders    Lipid panel (Completed)    Other fatigue        Relevant Orders    Comprehensive metabolic panel (Completed)    CBC with Differential/Platelet (Completed)    TSH  (Completed)    Other problems related to lifestyle        Relevant Orders    HIV antibody (Completed)    Hepatitis C antibody (Completed)    Cervical cancer screening        Relevant Orders    Cytology - PAP    Vitamin D deficiency        Relevant Orders    VITAMIN D 25 Hydroxy (Vit-D Deficiency, Fractures) (Completed)    Breast cancer screening        Relevant Orders    MM DIGITAL SCREENING BILATERAL    Need for prophylactic vaccination against Streptococcus pneumoniae (pneumococcus)        Relevant Orders    Pneumococcal polysaccharide vaccine 23-valent greater than or equal to 2yo subcutaneous/IM (Completed)       I have discontinued Ms. Storck's calcium-vitamin D. I am also having her maintain her aspirin EC, multivitamin, escitalopram, lovastatin, Vitamin D3, Biotin, and doxylamine (Sleep).  Meds ordered this encounter  Medications  . Cholecalciferol (VITAMIN D3) 1000 units CAPS    Sig: Take 1 capsule by mouth daily.  . Biotin (BIOTIN 5000) 5 MG CAPS    Sig: Take 1 capsule by mouth daily.  Marland Kitchen doxylamine, Sleep, (SLEEP AID) 25 MG tablet    Sig: Take 25 mg by mouth at bedtime as needed.    Medications Discontinued During This Encounter  Medication Reason  . calcium-vitamin D (OSCAL) 250-125 MG-UNIT per tablet Change in therapy    Follow-up: Return in about 1 year (around 04/02/2017).   Crecencio Mc, MD

## 2016-04-02 NOTE — Progress Notes (Signed)
Pre-visit discussion using our clinic review tool. No additional management support is needed unless otherwise documented below in the visit note.  

## 2016-04-03 LAB — HEPATITIS C ANTIBODY: HCV Ab: NEGATIVE

## 2016-04-03 LAB — HIV ANTIBODY (ROUTINE TESTING W REFLEX): HIV: NONREACTIVE

## 2016-04-04 NOTE — Assessment & Plan Note (Signed)
LDL and triglycerides are at goal on current medications. She has no side effects and liver enzymes are normal. No changes today.  

## 2016-04-04 NOTE — Assessment & Plan Note (Signed)
Annual Medicare wellness  exam was done as well as a comprehensive physical exam and management of acute and chronic conditions .  During the course of the visit the patient was educated and counseled about appropriate screening and preventive services including : fall prevention , diabetes screening, nutrition counseling, colorectal cancer screening, and recommended immunizations.  Printed recommendations for health maintenance screenings was given.   Lab Results  Component Value Date   CHOL 183 04/02/2016   HDL 59.10 04/02/2016   LDLCALC 105* 04/02/2016   LDLDIRECT 94.3 03/26/2014   TRIG 97.0 04/02/2016   CHOLHDL 3 04/02/2016   Lab Results  Component Value Date   TSH 2.04 04/02/2016   Lab Results  Component Value Date   NA 141 04/02/2016   K 4.3 04/02/2016   CL 104 04/02/2016   CO2 29 04/02/2016

## 2016-04-05 ENCOUNTER — Other Ambulatory Visit: Payer: Self-pay | Admitting: Internal Medicine

## 2016-04-06 ENCOUNTER — Other Ambulatory Visit: Payer: Self-pay | Admitting: Internal Medicine

## 2016-04-06 ENCOUNTER — Telehealth: Payer: Self-pay | Admitting: Internal Medicine

## 2016-04-06 LAB — CYTOLOGY - PAP

## 2016-04-06 MED ORDER — ESCITALOPRAM OXALATE 5 MG PO TABS: 5.0000 mg | ORAL_TABLET | Freq: Every day | ORAL | Status: DC

## 2016-04-06 NOTE — Telephone Encounter (Signed)
Pt is requesting a refill on her escitalopram (LEXAPRO) 5 MG tablet. Please call pt at home.

## 2016-04-07 NOTE — Telephone Encounter (Signed)
Medication refilled

## 2016-04-12 ENCOUNTER — Telehealth: Payer: Self-pay | Admitting: Internal Medicine

## 2016-04-12 NOTE — Telephone Encounter (Signed)
Please advise change in this order.

## 2016-04-12 NOTE — Telephone Encounter (Signed)
Mckay [phone] called from [facility] regarding pt Mammo order she states that's something that they do not do. Order is w/CAD and they do a regular screening. Thank you!

## 2016-04-12 NOTE — Telephone Encounter (Signed)
Please let me know what they are asking for.

## 2016-05-27 ENCOUNTER — Other Ambulatory Visit: Payer: Self-pay | Admitting: Internal Medicine

## 2016-06-08 ENCOUNTER — Ambulatory Visit
Admission: RE | Admit: 2016-06-08 | Discharge: 2016-06-08 | Disposition: A | Discharge status: Home / Self Care | Payer: PPO | Source: Ambulatory Visit | Attending: Internal Medicine | Admitting: Internal Medicine

## 2016-06-08 DIAGNOSIS — Z780 Asymptomatic menopausal state: Secondary | ICD-10-CM | POA: Diagnosis not present

## 2016-06-08 DIAGNOSIS — Z1239 Encounter for other screening for malignant neoplasm of breast: Secondary | ICD-10-CM

## 2016-06-08 DIAGNOSIS — Z1231 Encounter for screening mammogram for malignant neoplasm of breast: Secondary | ICD-10-CM | POA: Diagnosis not present

## 2016-06-08 DIAGNOSIS — Z13820 Encounter for screening for osteoporosis: Secondary | ICD-10-CM | POA: Diagnosis not present

## 2016-09-10 ENCOUNTER — Other Ambulatory Visit: Payer: Self-pay | Admitting: Internal Medicine

## 2016-11-22 DIAGNOSIS — L820 Inflamed seborrheic keratosis: Secondary | ICD-10-CM | POA: Diagnosis not present

## 2016-11-22 DIAGNOSIS — I781 Nevus, non-neoplastic: Secondary | ICD-10-CM | POA: Diagnosis not present

## 2016-11-22 DIAGNOSIS — D1800 Hemangioma unspecified site: Secondary | ICD-10-CM | POA: Diagnosis not present

## 2016-11-22 DIAGNOSIS — Z85828 Personal history of other malignant neoplasm of skin: Secondary | ICD-10-CM | POA: Diagnosis not present

## 2016-11-22 DIAGNOSIS — L821 Other seborrheic keratosis: Secondary | ICD-10-CM | POA: Diagnosis not present

## 2016-11-22 DIAGNOSIS — D229 Melanocytic nevi, unspecified: Secondary | ICD-10-CM | POA: Diagnosis not present

## 2016-11-22 DIAGNOSIS — L570 Actinic keratosis: Secondary | ICD-10-CM | POA: Diagnosis not present

## 2016-11-22 DIAGNOSIS — L578 Other skin changes due to chronic exposure to nonionizing radiation: Secondary | ICD-10-CM | POA: Diagnosis not present

## 2016-11-22 DIAGNOSIS — L814 Other melanin hyperpigmentation: Secondary | ICD-10-CM | POA: Diagnosis not present

## 2016-11-22 DIAGNOSIS — I8393 Asymptomatic varicose veins of bilateral lower extremities: Secondary | ICD-10-CM | POA: Diagnosis not present

## 2016-11-22 DIAGNOSIS — Z1283 Encounter for screening for malignant neoplasm of skin: Secondary | ICD-10-CM | POA: Diagnosis not present

## 2016-11-22 DIAGNOSIS — L918 Other hypertrophic disorders of the skin: Secondary | ICD-10-CM | POA: Diagnosis not present

## 2016-11-23 ENCOUNTER — Ambulatory Visit (INDEPENDENT_AMBULATORY_CARE_PROVIDER_SITE_OTHER): Payer: PPO | Admitting: Family

## 2016-11-23 ENCOUNTER — Encounter: Payer: Self-pay | Admitting: Family

## 2016-11-23 VITALS — BP 106/58 | HR 82 | Temp 98.2°F | Ht 63.0 in | Wt 144.2 lb

## 2016-11-23 DIAGNOSIS — J209 Acute bronchitis, unspecified: Secondary | ICD-10-CM

## 2016-11-23 MED ORDER — PREDNISONE 10 MG PO TABS: ORAL_TABLET | ORAL | 0 refills | Status: DC

## 2016-11-23 MED ORDER — PROMETHAZINE-DM 6.25-15 MG/5ML PO SYRP: 5.0000 mL | ORAL_SOLUTION | Freq: Every evening | ORAL | 1 refills | Status: DC | PRN

## 2016-11-23 NOTE — Progress Notes (Signed)
Subjective:    Patient ID: Alexis James, female    DOB: 12/09/1951, 65 y.o.   MRN: [id number]  CC: Alexis James is a 65 y.o. female who presents today for an acute visit.    HPI: CC: Dry cough x one week and half, improved. Endorses  ear and sinus pressure. Has tried cough drops, mucinex, zrytec with some relief. Worried as had grandchildren. No wheezing, SOB, chills, fever.  Smoke exposure at home.     HISTORY:  Past Medical History:  Diagnosis Date  . Anxiety   . Hyperlipidemia    Past Surgical History:  Procedure Laterality Date  . BREAST SURGERY     breast reduction   Family History  Problem Relation Age of Onset  . Mental retardation Father 18  . Cancer Neg Hx     Allergies: Patient has no known allergies. Current Outpatient Prescriptions on File Prior to Visit  Medication Sig Dispense Refill  . aspirin EC 81 MG tablet Take 81 mg by mouth 2 (two) times a week.     . Cholecalciferol (VITAMIN D3) 1000 units CAPS Take 1 capsule by mouth daily.    Marland Kitchen lovastatin (MEVACOR) 40 MG tablet TAKE ONE TABLET BY MOUTH AT BEDTIME 90 tablet 1  . Multiple Vitamin (MULTIVITAMIN) tablet Take 1 tablet by mouth daily.     No current facility-administered medications on file prior to visit.     Social History  Substance Use Topics  . Smoking status: Passive Smoke Exposure - Never Smoker  . Smokeless tobacco: Never Used     Comment: Husband smoked x 38 years (quit 4 years ago)  . Alcohol use Yes     Comment: Occasional    Review of Systems  Constitutional: Negative for chills and fever.  HENT: Positive for congestion, sinus pressure and sore throat. Negative for rhinorrhea.   Respiratory: Positive for cough. Negative for shortness of breath and wheezing.   Cardiovascular: Negative for chest pain and palpitations.  Gastrointestinal: Negative for nausea and vomiting.      Objective:    BP (!) 106/58   Pulse 82   Temp 98.2 F (36.8 C) (Oral)   Ht 5\' 3"  (1.6 m)   Wt 144  lb 3.2 oz (65.4 kg)   SpO2 96%   BMI 25.54 kg/m    Physical Exam  Constitutional: She appears well-developed and well-nourished.  HENT:  Head: Normocephalic and atraumatic.  Right Ear: Hearing, tympanic membrane, external ear and ear canal normal. No drainage, swelling or tenderness. No foreign bodies. Tympanic membrane is not erythematous and not bulging. No middle ear effusion. No decreased hearing is noted.  Left Ear: Hearing, tympanic membrane, external ear and ear canal normal. No drainage, swelling or tenderness. No foreign bodies. Tympanic membrane is not erythematous and not bulging.  No middle ear effusion. No decreased hearing is noted.  Nose: Nose normal. No rhinorrhea. Right sinus exhibits no maxillary sinus tenderness and no frontal sinus tenderness. Left sinus exhibits no maxillary sinus tenderness and no frontal sinus tenderness.  Mouth/Throat: Uvula is midline, oropharynx is clear and moist and mucous membranes are normal. No oropharyngeal exudate, posterior oropharyngeal edema, posterior oropharyngeal erythema or tonsillar abscesses.  Eyes: Conjunctivae are normal.  Cardiovascular: Regular rhythm, normal heart sounds and normal pulses.   Pulmonary/Chest: Effort normal and breath sounds normal. She has no wheezes. She has no rhonchi. She has no rales.  Lymphadenopathy:       Head (right side): No submental, no submandibular,  no tonsillar, no preauricular, no posterior auricular and no occipital adenopathy present.       Head (left side): No submental, no submandibular, no tonsillar, no preauricular, no posterior auricular and no occipital adenopathy present.    She has no cervical adenopathy.  Neurological: She is alert.  Skin: Skin is warm and dry.  Psychiatric: She has a normal mood and affect. Her speech is normal and behavior is normal. Thought content normal.  Vitals reviewed.      Assessment & Plan:   1. Acute bronchitis, unspecified organism Afebrile. No  adventitious lung sounds. Due to patient's symptoms sinus pressure, ear pressure along with cough, suspect viral versus allergic etiology. Reasonable to trial short dose of oral prednisone. Cough syrup as needed. Return precautions given.  - promethazine-dextromethorphan (PROMETHAZINE-DM) 6.25-15 MG/5ML syrup; Take 5 mLs by mouth at bedtime as needed for cough.  Dispense: 40 mL; Refill: 1 - predniSONE (DELTASONE) 10 MG tablet; Take 40 mg by mouth on day 1, then taper 10 mg daily until gone  Dispense: 10 tablet; Refill: 0    I have discontinued [name]'s Biotin and doxylamine (Sleep). I am also having her maintain her aspirin EC, multivitamin, Vitamin D3, lovastatin, and escitalopram.   Meds ordered this encounter  Medications  . escitalopram (LEXAPRO) 10 MG tablet    Sig: Take by mouth.    Return precautions given.   Risks, benefits, and alternatives of the medications and treatment plan prescribed today were discussed, and patient expressed understanding.   Education regarding symptom management and diagnosis given to patient on AVS.  Continue to follow with TULLO, Aris Everts, MD for routine health maintenance.   Lisbeth Renshaw Hannis and I agreed with plan.   Mable Paris, FNP

## 2016-11-23 NOTE — Progress Notes (Signed)
Pre visit review using our clinic review tool, if applicable. No additional management support is needed unless otherwise documented below in the visit note. 

## 2016-11-23 NOTE — Patient Instructions (Signed)

## 2016-11-24 ENCOUNTER — Ambulatory Visit: Payer: PPO | Admitting: Internal Medicine

## 2016-12-11 ENCOUNTER — Other Ambulatory Visit: Payer: Self-pay | Admitting: Internal Medicine

## 2016-12-12 ENCOUNTER — Other Ambulatory Visit: Payer: Self-pay | Admitting: Internal Medicine

## 2017-01-04 DIAGNOSIS — H2511 Age-related nuclear cataract, right eye: Secondary | ICD-10-CM | POA: Diagnosis not present

## 2017-01-04 DIAGNOSIS — H52223 Regular astigmatism, bilateral: Secondary | ICD-10-CM | POA: Diagnosis not present

## 2017-01-04 DIAGNOSIS — H43813 Vitreous degeneration, bilateral: Secondary | ICD-10-CM | POA: Diagnosis not present

## 2017-01-04 DIAGNOSIS — H5213 Myopia, bilateral: Secondary | ICD-10-CM | POA: Diagnosis not present

## 2017-01-04 DIAGNOSIS — H2512 Age-related nuclear cataract, left eye: Secondary | ICD-10-CM | POA: Diagnosis not present

## 2017-02-18 DIAGNOSIS — J019 Acute sinusitis, unspecified: Secondary | ICD-10-CM | POA: Diagnosis not present

## 2017-03-09 ENCOUNTER — Other Ambulatory Visit: Payer: Self-pay | Admitting: Internal Medicine

## 2017-04-04 ENCOUNTER — Other Ambulatory Visit: Payer: Self-pay | Admitting: Internal Medicine

## 2017-04-04 ENCOUNTER — Ambulatory Visit (INDEPENDENT_AMBULATORY_CARE_PROVIDER_SITE_OTHER): Payer: PPO | Admitting: Internal Medicine

## 2017-04-04 ENCOUNTER — Encounter: Payer: Self-pay | Admitting: Internal Medicine

## 2017-04-04 VITALS — BP 106/62 | HR 75 | Temp 98.1°F | Resp 15 | Ht 62.5 in | Wt 142.4 lb

## 2017-04-04 DIAGNOSIS — E559 Vitamin D deficiency, unspecified: Secondary | ICD-10-CM | POA: Diagnosis not present

## 2017-04-04 DIAGNOSIS — E7800 Pure hypercholesterolemia, unspecified: Secondary | ICD-10-CM | POA: Diagnosis not present

## 2017-04-04 DIAGNOSIS — R5383 Other fatigue: Secondary | ICD-10-CM | POA: Diagnosis not present

## 2017-04-04 DIAGNOSIS — H6120 Impacted cerumen, unspecified ear: Secondary | ICD-10-CM | POA: Diagnosis not present

## 2017-04-04 DIAGNOSIS — Z85828 Personal history of other malignant neoplasm of skin: Secondary | ICD-10-CM

## 2017-04-04 DIAGNOSIS — F419 Anxiety disorder, unspecified: Secondary | ICD-10-CM

## 2017-04-04 DIAGNOSIS — Z1211 Encounter for screening for malignant neoplasm of colon: Secondary | ICD-10-CM | POA: Diagnosis not present

## 2017-04-04 LAB — COMPREHENSIVE METABOLIC PANEL
ALT: 17 U/L (ref 0–35)
AST: 21 U/L (ref 0–37)
Albumin: 4.3 g/dL (ref 3.5–5.2)
Alkaline Phosphatase: 57 U/L (ref 39–117)
BILIRUBIN TOTAL: 0.4 mg/dL (ref 0.2–1.2)
BUN: 22 mg/dL (ref 6–23)
CHLORIDE: 104 meq/L (ref 96–112)
CO2: 30 meq/L (ref 19–32)
CREATININE: 0.87 mg/dL (ref 0.40–1.20)
Calcium: 9.8 mg/dL (ref 8.4–10.5)
GFR: 69.05 mL/min (ref >60.00)
Glucose, Bld: 91 mg/dL (ref 70–99)
Potassium: 4.2 mEq/L (ref 3.5–5.1)
SODIUM: 140 meq/L (ref 135–145)
Total Protein: 6.9 g/dL (ref 6.0–8.3)

## 2017-04-04 LAB — VITAMIN D 25 HYDROXY (VIT D DEFICIENCY, FRACTURES): VITD: 31.76 ng/mL (ref 30.00–100.00)

## 2017-04-04 LAB — LIPID PANEL
CHOL/HDL RATIO: 3
CHOLESTEROL: 193 mg/dL (ref 0–200)
HDL: 66.6 mg/dL (ref >39.00)
LDL Cholesterol: 103 mg/dL — ABNORMAL HIGH (ref 0–99)
NonHDL: 126.5
TRIGLYCERIDES: 117 mg/dL (ref 0.0–149.0)
VLDL: 23.4 mg/dL (ref 0.0–40.0)

## 2017-04-04 LAB — TSH: TSH: 3.16 u[IU]/mL (ref 0.35–4.50)

## 2017-04-04 MED ORDER — ESCITALOPRAM OXALATE 5 MG PO TABS: 5.0000 mg | ORAL_TABLET | Freq: Every day | ORAL | 3 refills | Status: DC

## 2017-04-04 NOTE — Progress Notes (Signed)
While patient in office patient received ear irrigation to right ear due to cerumen in ear ordered by PCP. Patient refused bilateral stated only the right ear, patient ear was irrigated with warm water for 10 -15 minutes moderate amount of cerumen flushed from ear.

## 2017-04-04 NOTE — Patient Instructions (Signed)
The ShingRx vaccine will be available in about 3 months and IS ADVISED for all interested adults over 50 to prevent shingles    Health Maintenance for Postmenopausal Women Menopause is a normal process in which your reproductive ability comes to an end. This process happens gradually over a span of months to years, usually between the ages of 68 and 38. Menopause is complete when you have missed 12 consecutive menstrual periods. It is important to talk with your health care provider about some of the most common conditions that affect postmenopausal women, such as heart disease, cancer, and bone loss (osteoporosis). Adopting a healthy lifestyle and getting preventive care can help to promote your health and wellness. Those actions can also lower your chances of developing some of these common conditions. What should I know about menopause? During menopause, you may experience a number of symptoms, such as:  Moderate-to-severe hot flashes.  Night sweats.  Decrease in sex drive.  Mood swings.  Headaches.  Tiredness.  Irritability.  Memory problems.  Insomnia. Choosing to treat or not to treat menopausal changes is an individual decision that you make with your health care provider. What should I know about hormone replacement therapy and supplements? Hormone therapy products are effective for treating symptoms that are associated with menopause, such as hot flashes and night sweats. Hormone replacement carries certain risks, especially as you become older. If you are thinking about using estrogen or estrogen with progestin treatments, discuss the benefits and risks with your health care provider. What should I know about heart disease and stroke? Heart disease, heart attack, and stroke become more likely as you age. This may be due, in part, to the hormonal changes that your body experiences during menopause. These can affect how your body processes dietary fats, triglycerides, and  cholesterol. Heart attack and stroke are both medical emergencies. There are many things that you can do to help prevent heart disease and stroke:  Have your blood pressure checked at least every 1-2 years. High blood pressure causes heart disease and increases the risk of stroke.  If you are 34-57 years old, ask your health care provider if you should take aspirin to prevent a heart attack or a stroke.  Do not use any tobacco products, including cigarettes, chewing tobacco, or electronic cigarettes. If you need help quitting, ask your health care provider.  It is important to eat a healthy diet and maintain a healthy weight.  Be sure to include plenty of vegetables, fruits, low-fat dairy products, and lean protein.  Avoid eating foods that are high in solid fats, added sugars, or salt (sodium).  Get regular exercise. This is one of the most important things that you can do for your health.  Try to exercise for at least 150 minutes each week. The type of exercise that you do should increase your heart rate and make you sweat. This is known as moderate-intensity exercise.  Try to do strengthening exercises at least twice each week. Do these in addition to the moderate-intensity exercise.  Know your numbers.Ask your health care provider to check your cholesterol and your blood glucose. Continue to have your blood tested as directed by your health care provider. What should I know about cancer screening? There are several types of cancer. Take the following steps to reduce your risk and to catch any cancer development as early as possible. Breast Cancer  Practice breast self-awareness.  This means understanding how your breasts normally appear and feel.  It also  means doing regular breast self-exams. Let your health care provider know about any changes, no matter how small.  If you are 28 or older, have a clinician do a breast exam (clinical breast exam or CBE) every year. Depending on  your age, family history, and medical history, it may be recommended that you also have a yearly breast X-ray (mammogram).  If you have a family history of breast cancer, talk with your health care provider about genetic screening.  If you are at high risk for breast cancer, talk with your health care provider about having an MRI and a mammogram every year.  Breast cancer (BRCA) gene test is recommended for women who have family members with BRCA-related cancers. Results of the assessment will determine the need for genetic counseling and BRCA1 and for BRCA2 testing. BRCA-related cancers include these types:  Breast. This occurs in males or females.  Ovarian.  Tubal. This may also be called fallopian tube cancer.  Cancer of the abdominal or pelvic lining (peritoneal cancer).  Prostate.  Pancreatic. Cervical, Uterine, and Ovarian Cancer  Your health care provider may recommend that you be screened regularly for cancer of the pelvic organs. These include your ovaries, uterus, and vagina. This screening involves a pelvic exam, which includes checking for microscopic changes to the surface of your cervix (Pap test).  For women ages 21-65, health care providers may recommend a pelvic exam and a Pap test every three years. For women ages 39-65, they may recommend the Pap test and pelvic exam, combined with testing for human papilloma virus (HPV), every five years. Some types of HPV increase your risk of cervical cancer. Testing for HPV may also be done on women of any age who have unclear Pap test results.  Other health care providers may not recommend any screening for nonpregnant women who are considered low risk for pelvic cancer and have no symptoms. Ask your health care provider if a screening pelvic exam is right for you.  If you have had past treatment for cervical cancer or a condition that could lead to cancer, you need Pap tests and screening for cancer for at least 20 years after your  treatment. If Pap tests have been discontinued for you, your risk factors (such as having a new sexual partner) need to be reassessed to determine if you should start having screenings again. Some women have medical problems that increase the chance of getting cervical cancer. In these cases, your health care provider may recommend that you have screening and Pap tests more often.  If you have a family history of uterine cancer or ovarian cancer, talk with your health care provider about genetic screening.  If you have vaginal bleeding after reaching menopause, tell your health care provider.  There are currently no reliable tests available to screen for ovarian cancer. Lung Cancer  Lung cancer screening is recommended for adults 63-49 years old who are at high risk for lung cancer because of a history of smoking. A yearly low-dose CT scan of the lungs is recommended if you:  Currently smoke.  Have a history of at least 30 pack-years of smoking and you currently smoke or have quit within the past 15 years. A pack-year is smoking an average of one pack of cigarettes per day for one year. Yearly screening should:  Continue until it has been 15 years since you quit.  Stop if you develop a health problem that would prevent you from having lung cancer treatment. Colorectal Cancer  This type of cancer can be detected and can often be prevented.  Routine colorectal cancer screening usually begins at age 66 and continues through age 73.  If you have risk factors for colon cancer, your health care provider may recommend that you be screened at an earlier age.  If you have a family history of colorectal cancer, talk with your health care provider about genetic screening.  Your health care provider may also recommend using home test kits to check for hidden blood in your stool.  A small camera at the end of a tube can be used to examine your colon directly (sigmoidoscopy or colonoscopy). This is  done to check for the earliest forms of colorectal cancer.  Direct examination of the colon should be repeated every 5-10 years until age 31. However, if early forms of precancerous polyps or small growths are found or if you have a family history or genetic risk for colorectal cancer, you may need to be screened more often. Skin Cancer  Check your skin from head to toe regularly.  Monitor any moles. Be sure to tell your health care provider:  About any new moles or changes in moles, especially if there is a change in a mole's shape or color.  If you have a mole that is larger than the size of a pencil eraser.  If any of your family members has a history of skin cancer, especially at a young age, talk with your health care provider about genetic screening.  Always use sunscreen. Apply sunscreen liberally and repeatedly throughout the day.  Whenever you are outside, protect yourself by wearing long sleeves, pants, a wide-brimmed hat, and sunglasses. What should I know about osteoporosis? Osteoporosis is a condition in which bone destruction happens more quickly than new bone creation. After menopause, you may be at an increased risk for osteoporosis. To help prevent osteoporosis or the bone fractures that can happen because of osteoporosis, the following is recommended:  If you are 72-36 years old, get at least 1,000 mg of calcium and at least 600 mg of vitamin D per day.  If you are older than age 20 but younger than age 90, get at least 1,200 mg of calcium and at least 600 mg of vitamin D per day.  If you are older than age 5, get at least 1,200 mg of calcium and at least 800 mg of vitamin D per day. Smoking and excessive alcohol intake increase the risk of osteoporosis. Eat foods that are rich in calcium and vitamin D, and do weight-bearing exercises several times each week as directed by your health care provider. What should I know about how menopause affects my mental  health? Depression may occur at any age, but it is more common as you become older. Common symptoms of depression include:  Low or sad mood.  Changes in sleep patterns.  Changes in appetite or eating patterns.  Feeling an overall lack of motivation or enjoyment of activities that you previously enjoyed.  Frequent crying spells. Talk with your health care provider if you think that you are experiencing depression. What should I know about immunizations? It is important that you get and maintain your immunizations. These include:  Tetanus, diphtheria, and pertussis (Tdap) booster vaccine.  Influenza every year before the flu season begins.  Pneumonia vaccine.  Shingles vaccine. Your health care provider may also recommend other immunizations. This information is not intended to replace advice given to you by your health care provider. Make  sure you discuss any questions you have with your health care provider. Document Released: 01/21/2006 Document Revised: 06/18/2016 Document Reviewed: 09/02/2015 Elsevier Interactive Patient Education  2017 [employer].

## 2017-04-04 NOTE — Progress Notes (Signed)
Patient ID: Alexis James, female    DOB: May 13, 1951  Age: 66 y.o. MRN: [id number]  The patient is here for management of other chronic and acute problems.  Last seen April 2017 Mammogram June 2017 DEXA 03/19/16 Colonoscopy 03/20/11, normal  (Iftikhar) no polyps   Treated in December by MA for bronchitis .   Mother died last 03/20/2023,  Husband had a witnessed CVA last April at home. She reacted quickly and was able to get him treated emergently .  He is recovering well   Lab Results  Component Value Date   CHOL 193 04/04/2017   HDL 66.60 04/04/2017   LDLCALC 103 (H) 04/04/2017   LDLDIRECT 94.3 03/26/2014   TRIG 117.0 04/04/2017   CHOLHDL 3 04/04/2017      The risk factors are reflected in the social history.  The roster of all physicians providing medical care to patient - is listed in the Snapshot section of the chart.  Activities of daily living:  The patient is 100% independent in all ADLs: dressing, toileting, feeding as well as independent mobility  Home safety : The patient has smoke detectors in the home. They wear seatbelts.  There are no firearms at home. There is no violence in the home.   There is no risks for hepatitis, STDs or HIV. There is no   history of blood transfusion. They have no travel history to infectious disease endemic areas of the world.  The patient has seen their dentist in the last six month. They have seen their eye doctor in the last year. They have no hearing difficulty and had a hearing test last year at [facility]   They have deferred audiologic testing in the last year.  They do not  have excessive sun exposure. Discussed the need for sun protection: hats, long sleeves and use of sunscreen if there is significant sun exposure.   Diet: the importance of a healthy diet is discussed. They do have a healthy diet.  The benefits of regular aerobic exercise were discussed. She walks 4 times per week ,  20 minutes.   Depression screen: there are no signs or  vegative symptoms of depression- irritability, change in appetite, anhedonia, sadness/tearfullness.  Cognitive assessment: the patient manages all their financial and personal affairs and is actively engaged. They could relate day,date,year and events; recalled 2/3 objects at 3 minutes; performed clock-face test normally.  The following portions of the patient's history were reviewed and updated as appropriate: allergies, current medications, past family history, past medical history,  past surgical history, past social history  and problem list.  Visual acuity was not assessed per patient preference since she has regular follow up with her ophthalmologist. Hearing and body mass index were assessed and reviewed.   During the course of the visit the patient was educated and counseled about appropriate screening and preventive services including : fall prevention , diabetes screening, nutrition counseling, colorectal cancer screening, and recommended immunizations.    CC: The primary encounter diagnosis was Fatigue, unspecified type. Diagnoses of Hearing loss secondary to cerumen impaction, unspecified laterality, Vitamin D deficiency, Pure hypercholesterolemia, Screening for colon cancer, and Anxiety were also pertinent to this visit.  History Alexis James has a past medical history of Anxiety and Hyperlipidemia.   She has a past surgical history that includes Breast surgery.   Her family history includes Colitis (age of onset: 31) in her mother; Mental retardation (age of onset: 69) in her father.She reports that she is a non-smoker  but has been exposed to tobacco smoke. She has never used smokeless tobacco. She reports that she drinks alcohol. She reports that she does not use drugs.  Outpatient Medications Prior to Visit  Medication Sig Dispense Refill  . aspirin EC 81 MG tablet Take 81 mg by mouth 2 (two) times a week.     . lovastatin (MEVACOR) 40 MG tablet TAKE ONE TABLET BY MOUTH AT BEDTIME 90  tablet 0  . escitalopram (LEXAPRO) 5 MG tablet TAKE ONE TABLET BY MOUTH ONCE DAILY 30 tablet 3  . Cholecalciferol (VITAMIN D3) 1000 units CAPS Take 1 capsule by mouth daily.    Marland Kitchen escitalopram (LEXAPRO) 10 MG tablet Take by mouth.    . Multiple Vitamin (MULTIVITAMIN) tablet Take 1 tablet by mouth daily.    . predniSONE (DELTASONE) 10 MG tablet Take 40 mg by mouth on day 1, then taper 10 mg daily until gone (Patient not taking: Reported on 04/04/2017) 10 tablet 0  . promethazine-dextromethorphan (PROMETHAZINE-DM) 6.25-15 MG/5ML syrup Take 5 mLs by mouth at bedtime as needed for cough. (Patient not taking: Reported on 04/04/2017) 40 mL 1   No facility-administered medications prior to visit.     Review of Systems   Patient denies headache, fevers, malaise, unintentional weight loss, skin rash, eye pain, sinus congestion and sinus pain, sore throat, dysphagia,  hemoptysis , cough, dyspnea, wheezing, chest pain, palpitations, orthopnea, edema, abdominal pain, nausea, melena, diarrhea, constipation, flank pain, dysuria, hematuria, urinary  Frequency, nocturia, numbness, tingling, seizures,  Focal weakness, Loss of consciousness,  Tremor, insomnia, depression, anxiety, and suicidal ideation.      Objective:  BP 106/62   Pulse 75   Temp 98.1 F (36.7 C) (Oral)   Resp 15   Ht 5' 2.5" (1.588 m)   Wt 142 lb 6.4 oz (64.6 kg)   SpO2 95%   BMI 25.63 kg/m   Physical Exam   General appearance: alert, cooperative and appears stated age Head: Normocephalic, without obvious abnormality, atraumatic Eyes: conjunctivae/corneas clear. PERRL, EOM's intact. Fundi benign. Ears: normal TM's and external ear canals both ears Nose: Nares normal. Septum midline. Mucosa normal. No drainage or sinus tenderness. Throat: lips, mucosa, and tongue normal; teeth and gums normal Neck: no adenopathy, no carotid bruit, no JVD, supple, symmetrical, trachea midline and thyroid not enlarged, symmetric, no  tenderness/mass/nodules Lungs: clear to auscultation bilaterally Breasts: normal appearance, no masses or tenderness Heart: regular rate and rhythm, S1, S2 normal, no murmur, click, rub or gallop Abdomen: soft, non-tender; bowel sounds normal; no masses,  no organomegaly Extremities: extremities normal, atraumatic, no cyanosis or edema Pulses: 2+ and symmetric Skin: Skin color, texture, turgor normal. No rashes or lesions Neurologic: Alert and oriented X 3, normal strength and tone. Normal symmetric reflexes. Normal coordination and gait.      Assessment & Plan:   Problem List Items Addressed This Visit    Anxiety    Managed with lexapro, chosen for concurrent beneficial effects on menopause.        Relevant Medications   escitalopram (LEXAPRO) 5 MG tablet   Hearing loss secondary to cerumen impaction, unspecified laterality    rn visit ordered for irrigation      Hyperlipidemia    LDL and triglycerides are at goal on current medications. She has no side effects and liver enzymes are normal. No changes today  Lab Results  Component Value Date   CHOL 193 04/04/2017   HDL 66.60 04/04/2017   LDLCALC 103 (H) 04/04/2017  LDLDIRECT 94.3 03/26/2014   TRIG 117.0 04/04/2017   CHOLHDL 3 04/04/2017   Lab Results  Component Value Date   ALT 17 04/04/2017   AST 21 04/04/2017   ALKPHOS 57 04/04/2017   BILITOT 0.4 04/04/2017         Relevant Orders   Lipid panel (Completed)   Screening for colon cancer    Last  colonoscopy was normal except for diverticulosis in March 2012 (Iftikhar, report in chart)        Other Visit Diagnoses    Fatigue, unspecified type    -  Primary   Relevant Orders   Comprehensive metabolic panel (Completed)   TSH (Completed)   Vitamin D deficiency       Relevant Orders   VITAMIN D 25 Hydroxy (Vit-D Deficiency, Fractures) (Completed)     A total of 40 minutes was spent with patient more than half of which was spent in counseling patient on the  above mentioned issues , reviewing and explaining recent labs and previous  imaging studies done, and coordination of care.   I have discontinued Alexis James's multivitamin, Vitamin D3, escitalopram, promethazine-dextromethorphan, and predniSONE. I have also changed her escitalopram. Additionally, I am having her maintain her aspirin EC, lovastatin, fluticasone, calcium citrate-vitamin D, co-enzyme Q-10, Specialty Vitamins Products (HEART HEALTH PACK PO), Melatonin, and diphenhydramine-acetaminophen.  Meds ordered this encounter  Medications  . fluticasone (FLONASE) 50 MCG/ACT nasal spray  . calcium citrate-vitamin D (CITRACAL+D) 315-200 MG-UNIT tablet    Sig: Take 1 tablet by mouth 2 (two) times daily.  Marland Kitchen co-enzyme Q-10 30 MG capsule    Sig: Take 100 mg by mouth daily.  Marland Kitchen Specialty Vitamins Products (HEART HEALTH PACK PO)    Sig: Take by mouth.  . Melatonin 10 MG CAPS    Sig: Take by mouth as needed.  . diphenhydramine-acetaminophen (TYLENOL PM) 25-500 MG TABS tablet    Sig: Take 1 tablet by mouth at bedtime as needed.  Marland Kitchen escitalopram (LEXAPRO) 5 MG tablet    Sig: Take 1 tablet (5 mg total) by mouth daily.    Dispense:  90 tablet    Refill:  3    Please consider 90 day supplies to promote better adherence    Medications Discontinued During This Encounter  Medication Reason  . Cholecalciferol (VITAMIN D3) 1000 units CAPS Patient has not taken in last 30 days  . escitalopram (LEXAPRO) 10 MG tablet Patient has not taken in last 30 days  . Multiple Vitamin (MULTIVITAMIN) tablet Patient has not taken in last 30 days  . predniSONE (DELTASONE) 10 MG tablet Patient has not taken in last 30 days  . promethazine-dextromethorphan (PROMETHAZINE-DM) 6.25-15 MG/5ML syrup Patient has not taken in last 30 days  . escitalopram (LEXAPRO) 5 MG tablet Reorder    Follow-up: No Follow-up on file.   Crecencio Mc, MD

## 2017-04-04 NOTE — Progress Notes (Signed)
Pre visit review using our clinic review tool, if applicable. No additional management support is needed unless otherwise documented below in the visit note. 

## 2017-04-04 NOTE — Assessment & Plan Note (Signed)
rn visit ordered for irrigation

## 2017-04-07 ENCOUNTER — Telehealth: Payer: Self-pay

## 2017-04-07 DIAGNOSIS — Z1239 Encounter for other screening for malignant neoplasm of breast: Secondary | ICD-10-CM

## 2017-04-07 DIAGNOSIS — R5383 Other fatigue: Secondary | ICD-10-CM

## 2017-04-07 NOTE — Assessment & Plan Note (Signed)
Last  colonoscopy was normal except for diverticulosis in March 2012 [facility], report in chart)

## 2017-04-07 NOTE — Assessment & Plan Note (Signed)
Managed with lexapro, chosen for concurrent beneficial effects on menopause.

## 2017-04-07 NOTE — Telephone Encounter (Signed)
Spoke with pt and informed her of her lab results. Pt gave a verbal understanding. Scheduled pt for a lab appt. Pt is aware of appt date and time. Also ordered pt a mammogram per pt request.

## 2017-04-07 NOTE — Telephone Encounter (Signed)
-----   Message from Crecencio Mc, MD sent at 04/05/2017  9:08 PM EDT ----- Your Cholesterol, thyroid ,vitamin D,  liver and kidney function are normal.  Please continue your current medications and plan to repeat non fasting labs in 6 months.   Regards,  Dr. Derrel Nip

## 2017-04-07 NOTE — Assessment & Plan Note (Signed)
LDL and triglycerides are at goal on current medications. She has no side effects and liver enzymes are normal. No changes today  Lab Results  Component Value Date   CHOL 193 04/04/2017   HDL 66.60 04/04/2017   LDLCALC 103 (H) 04/04/2017   LDLDIRECT 94.3 03/26/2014   TRIG 117.0 04/04/2017   CHOLHDL 3 04/04/2017   Lab Results  Component Value Date   ALT 17 04/04/2017   AST 21 04/04/2017   ALKPHOS 57 04/04/2017   BILITOT 0.4 04/04/2017

## 2017-04-12 ENCOUNTER — Other Ambulatory Visit: Payer: Self-pay | Admitting: Internal Medicine

## 2017-04-12 DIAGNOSIS — Z1231 Encounter for screening mammogram for malignant neoplasm of breast: Secondary | ICD-10-CM

## 2017-06-01 ENCOUNTER — Telehealth: Payer: Self-pay | Admitting: Internal Medicine

## 2017-06-01 NOTE — Telephone Encounter (Signed)
Spoke to pt. She is out of town. Would like me to call her back next week to schedule AWV

## 2017-06-07 ENCOUNTER — Other Ambulatory Visit: Payer: Self-pay | Admitting: Internal Medicine

## 2017-06-09 ENCOUNTER — Ambulatory Visit
Admission: RE | Admit: 2017-06-09 | Discharge: 2017-06-09 | Disposition: A | Discharge status: Home / Self Care | Payer: PPO | Source: Ambulatory Visit | Attending: Internal Medicine | Admitting: Internal Medicine

## 2017-06-09 ENCOUNTER — Other Ambulatory Visit: Payer: Self-pay | Admitting: Internal Medicine

## 2017-06-09 DIAGNOSIS — Z1231 Encounter for screening mammogram for malignant neoplasm of breast: Secondary | ICD-10-CM | POA: Diagnosis not present

## 2017-07-08 NOTE — Telephone Encounter (Signed)
Left pt message asking to call Allison back directly at [phone] to schedule AWV. Thanks! °

## 2017-08-11 DIAGNOSIS — H2512 Age-related nuclear cataract, left eye: Secondary | ICD-10-CM | POA: Diagnosis not present

## 2017-08-30 ENCOUNTER — Other Ambulatory Visit: Payer: Self-pay | Admitting: Internal Medicine

## 2017-10-05 ENCOUNTER — Other Ambulatory Visit (INDEPENDENT_AMBULATORY_CARE_PROVIDER_SITE_OTHER): Payer: PPO

## 2017-10-05 DIAGNOSIS — R5383 Other fatigue: Secondary | ICD-10-CM

## 2017-10-05 LAB — COMPREHENSIVE METABOLIC PANEL
ALT: 15 U/L (ref 0–35)
AST: 18 U/L (ref 0–37)
Albumin: 4.1 g/dL (ref 3.5–5.2)
Alkaline Phosphatase: 57 U/L (ref 39–117)
BILIRUBIN TOTAL: 0.4 mg/dL (ref 0.2–1.2)
BUN: 23 mg/dL (ref 6–23)
CHLORIDE: 102 meq/L (ref 96–112)
CO2: 31 meq/L (ref 19–32)
CREATININE: 0.83 mg/dL (ref 0.40–1.20)
Calcium: 9.4 mg/dL (ref 8.4–10.5)
GFR: 72.79 mL/min (ref >60.00)
Glucose, Bld: 75 mg/dL (ref 70–99)
Potassium: 3.8 mEq/L (ref 3.5–5.1)
SODIUM: 141 meq/L (ref 135–145)
Total Protein: 6.6 g/dL (ref 6.0–8.3)

## 2017-10-06 DIAGNOSIS — H2511 Age-related nuclear cataract, right eye: Secondary | ICD-10-CM | POA: Diagnosis not present

## 2017-10-13 ENCOUNTER — Encounter: Payer: Self-pay | Admitting: *Deleted

## 2017-10-18 ENCOUNTER — Encounter
Admission: RE | Disposition: A | Discharge status: Home / Self Care | Payer: Self-pay | Source: Ambulatory Visit | Attending: Ophthalmology

## 2017-10-18 ENCOUNTER — Ambulatory Visit
Admission: RE | Admit: 2017-10-18 | Discharge: 2017-10-18 | Disposition: A | Discharge status: Home / Self Care | Payer: PPO | Source: Ambulatory Visit | Attending: Ophthalmology | Admitting: Ophthalmology

## 2017-10-18 ENCOUNTER — Ambulatory Visit: Payer: PPO | Admitting: Certified Registered"

## 2017-10-18 ENCOUNTER — Encounter: Payer: Self-pay | Admitting: *Deleted

## 2017-10-18 DIAGNOSIS — E7800 Pure hypercholesterolemia, unspecified: Secondary | ICD-10-CM | POA: Diagnosis not present

## 2017-10-18 DIAGNOSIS — F419 Anxiety disorder, unspecified: Secondary | ICD-10-CM | POA: Insufficient documentation

## 2017-10-18 DIAGNOSIS — H2511 Age-related nuclear cataract, right eye: Secondary | ICD-10-CM | POA: Diagnosis not present

## 2017-10-18 DIAGNOSIS — Z7982 Long term (current) use of aspirin: Secondary | ICD-10-CM | POA: Insufficient documentation

## 2017-10-18 DIAGNOSIS — Z79899 Other long term (current) drug therapy: Secondary | ICD-10-CM | POA: Insufficient documentation

## 2017-10-18 DIAGNOSIS — Z85828 Personal history of other malignant neoplasm of skin: Secondary | ICD-10-CM | POA: Diagnosis not present

## 2017-10-18 HISTORY — PX: CATARACT EXTRACTION W/PHACO: SHX586

## 2017-10-18 SURGERY — PHACOEMULSIFICATION, CATARACT, WITH IOL INSERTION
Anesthesia: Monitor Anesthesia Care | Site: Eye | Laterality: Right | Wound class: Clean

## 2017-10-18 MED ORDER — POVIDONE-IODINE 5 % OP SOLN
OPHTHALMIC | Status: DC | PRN
  Administered 2017-10-18: 1 via OPHTHALMIC

## 2017-10-18 MED ORDER — FENTANYL CITRATE (PF) 100 MCG/2ML IJ SOLN
INTRAMUSCULAR | Status: AC
  Filled 2017-10-18: qty 2

## 2017-10-18 MED ORDER — MOXIFLOXACIN HCL 0.5 % OP SOLN: 1.0000 [drp] | OPHTHALMIC | Status: DC | PRN

## 2017-10-18 MED ORDER — MOXIFLOXACIN HCL 0.5 % OP SOLN
OPHTHALMIC | Status: AC
  Filled 2017-10-18: qty 3

## 2017-10-18 MED ORDER — POVIDONE-IODINE 5 % OP SOLN
OPHTHALMIC | Status: AC
  Filled 2017-10-18: qty 30

## 2017-10-18 MED ORDER — NA CHONDROIT SULF-NA HYALURON 40-17 MG/ML IO SOLN
INTRAOCULAR | Status: DC | PRN
Start: 2017-10-18 — End: 2017-10-18
  Administered 2017-10-18: 1 mL via INTRAOCULAR

## 2017-10-18 MED ORDER — LIDOCAINE HCL (PF) 4 % IJ SOLN
INTRAOCULAR | Status: DC | PRN
  Administered 2017-10-18: 2 mL via OPHTHALMIC

## 2017-10-18 MED ORDER — MIDAZOLAM HCL 2 MG/2ML IJ SOLN
INTRAMUSCULAR | Status: DC | PRN
  Administered 2017-10-18: 1 mg via INTRAVENOUS

## 2017-10-18 MED ORDER — MIDAZOLAM HCL 2 MG/2ML IJ SOLN
INTRAMUSCULAR | Status: AC
  Filled 2017-10-18: qty 2

## 2017-10-18 MED ORDER — LIDOCAINE HCL (PF) 4 % IJ SOLN
INTRAMUSCULAR | Status: AC
  Filled 2017-10-18: qty 5

## 2017-10-18 MED ORDER — EPINEPHRINE PF 1 MG/ML IJ SOLN
INTRAMUSCULAR | Status: AC
  Filled 2017-10-18: qty 1

## 2017-10-18 MED ORDER — FENTANYL CITRATE (PF) 100 MCG/2ML IJ SOLN
INTRAMUSCULAR | Status: DC | PRN
  Administered 2017-10-18: 50 ug via INTRAVENOUS

## 2017-10-18 MED ORDER — MOXIFLOXACIN HCL 0.5 % OP SOLN
OPHTHALMIC | Status: DC | PRN
  Administered 2017-10-18: .2 mL via OPHTHALMIC

## 2017-10-18 MED ORDER — ARMC OPHTHALMIC DILATING DROPS
1.0000 "application " | OPHTHALMIC | Status: AC
  Administered 2017-10-18 (×3): 1 via OPHTHALMIC

## 2017-10-18 MED ORDER — SODIUM CHLORIDE 0.9 % IV SOLN
INTRAVENOUS | Status: DC
  Administered 2017-10-18: 11:00:00 via INTRAVENOUS

## 2017-10-18 MED ORDER — NA CHONDROIT SULF-NA HYALURON 40-17 MG/ML IO SOLN
INTRAOCULAR | Status: AC
  Filled 2017-10-18: qty 1

## 2017-10-18 MED ORDER — ARMC OPHTHALMIC DILATING DROPS
OPHTHALMIC | Status: AC
  Administered 2017-10-18: 12:00:00
  Filled 2017-10-18: qty 0.4

## 2017-10-18 MED ORDER — BSS IO SOLN
INTRAOCULAR | Status: DC | PRN
  Administered 2017-10-18: 1 mL via OPHTHALMIC

## 2017-10-18 MED ORDER — CARBACHOL 0.01 % IO SOLN
INTRAOCULAR | Status: DC | PRN
  Administered 2017-10-18: .5 mL via INTRAOCULAR

## 2017-10-18 SURGICAL SUPPLY — 16 items
GLOVE BIO SURGEON STRL SZ8 (GLOVE) ×2 IMPLANT
GLOVE BIOGEL M 6.5 STRL (GLOVE) ×2 IMPLANT
GLOVE SURG LX 8.0 MICRO (GLOVE) ×1
GLOVE SURG LX STRL 8.0 MICRO (GLOVE) ×1 IMPLANT
GOWN STRL REUS W/ TWL LRG LVL3 (GOWN DISPOSABLE) ×2 IMPLANT
GOWN STRL REUS W/TWL LRG LVL3 (GOWN DISPOSABLE) ×4
LABEL CATARACT MEDS ST (LABEL) ×2 IMPLANT
LENS IOL TECNIS ITEC 18.5 (Intraocular Lens) ×1 IMPLANT
PACK CATARACT (MISCELLANEOUS) ×2 IMPLANT
PACK CATARACT BRASINGTON LX (MISCELLANEOUS) ×2 IMPLANT
PACK EYE AFTER SURG (MISCELLANEOUS) ×2 IMPLANT
SOL BSS BAG (MISCELLANEOUS) ×2
SOLUTION BSS BAG (MISCELLANEOUS) ×1 IMPLANT
SYR 5ML LL (SYRINGE) ×2 IMPLANT
WATER STERILE IRR 250ML POUR (IV SOLUTION) ×2 IMPLANT
WIPE NON LINTING 3.25X3.25 (MISCELLANEOUS) ×2 IMPLANT

## 2017-10-18 NOTE — Anesthesia Preprocedure Evaluation (Signed)
Anesthesia Evaluation  Patient identified by MRN, date of birth, ID band Patient awake    Reviewed: Allergy & Precautions, NPO status , Patient's Chart, lab work & pertinent test results  History of Anesthesia Complications Negative for: history of anesthetic complications  Airway Mallampati: II  TM Distance: >3 FB Neck ROM: Full    Dental no notable dental hx.    Pulmonary neg pulmonary ROS, neg sleep apnea, neg COPD,    breath sounds clear to auscultation- rhonchi (-) wheezing      Cardiovascular Exercise Tolerance: Good (-) hypertension(-) CAD and (-) Past MI  Rhythm:Regular Rate:Normal - Systolic murmurs and - Diastolic murmurs    Neuro/Psych Anxiety negative neurological ROS     GI/Hepatic negative GI ROS, Neg liver ROS,   Endo/Other  negative endocrine ROSneg diabetes  Renal/GU negative Renal ROS     Musculoskeletal negative musculoskeletal ROS (+)   Abdominal (+) - obese,   Peds  Hematology negative hematology ROS (+)   Anesthesia Other Findings Past Medical History: No date: Anxiety No date: Hyperlipidemia   Reproductive/Obstetrics                             Anesthesia Physical Anesthesia Plan  ASA: II  Anesthesia Plan: MAC   Post-op Pain Management:    Induction: Intravenous  PONV Risk Score and Plan: 2 and Midazolam  Airway Management Planned: Natural Airway  Additional Equipment:   Intra-op Plan:   Post-operative Plan:   Informed Consent: I have reviewed the patients History and Physical, chart, labs and discussed the procedure including the risks, benefits and alternatives for the proposed anesthesia with the patient or authorized representative who has indicated his/her understanding and acceptance.     Plan Discussed with: CRNA and Anesthesiologist  Anesthesia Plan Comments:         Anesthesia Quick Evaluation

## 2017-10-18 NOTE — Transfer of Care (Signed)
Immediate Anesthesia Transfer of Care Note  Patient: Alexis James  Procedure(s) Performed: CATARACT EXTRACTION PHACO AND INTRAOCULAR LENS PLACEMENT (IOC) (Right Eye)  Patient Location: PACU  Anesthesia Type:MAC  Level of Consciousness: awake, alert  and oriented  Airway & Oxygen Therapy: Patient Spontanous Breathing  Post-op Assessment: Report given to RN and Post -op Vital signs reviewed and stable  Post vital signs: Reviewed and stable  Last Vitals:  Vitals:   10/18/17 1107 10/18/17 1201  BP: (!) 111/52 (!) 107/47  Pulse:    Resp:  13  Temp:    SpO2:  100%    Last Pain:  Vitals:   10/18/17 1030  TempSrc: Tympanic         Complications: No apparent anesthesia complications

## 2017-10-18 NOTE — H&P (Signed)
All labs reviewed. Abnormal studies sent to patients PCP when indicated.  Previous H&P reviewed, patient examined, there are NO CHANGES.  Alexis James LOUIS11/6/201811:35 AM

## 2017-10-18 NOTE — Anesthesia Post-op Follow-up Note (Signed)
Anesthesia QCDR form completed.        

## 2017-10-18 NOTE — Op Note (Signed)
PREOPERATIVE DIAGNOSIS:  Nuclear sclerotic cataract of the right eye.   POSTOPERATIVE DIAGNOSIS:  nuclear sclerotic cataract right eye   OPERATIVE PROCEDURE: Procedure(s): CATARACT EXTRACTION PHACO AND INTRAOCULAR LENS PLACEMENT (IOC)   SURGEON:  Birder Robson, MD.   ANESTHESIA:  Anesthesiologist: Emmie Niemann, MD CRNA: Lance Muss, CRNA  1.      Managed anesthesia care. 2.      0.67ml of Shugarcaine was instilled in the eye following the paracentesis.   COMPLICATIONS:  None.   TECHNIQUE:   Stop and chop   DESCRIPTION OF PROCEDURE:  The patient was examined and consented in the preoperative holding area where the aforementioned topical anesthesia was applied to the right eye and then brought back to the Operating Room where the right eye was prepped and draped in the usual sterile ophthalmic fashion and a lid speculum was placed. A paracentesis was created with the side port blade and the anterior chamber was filled with viscoelastic. A near clear corneal incision was performed with the steel keratome. A continuous curvilinear capsulorrhexis was performed with a cystotome followed by the capsulorrhexis forceps. Hydrodissection and hydrodelineation were carried out with BSS on a blunt cannula. The lens was removed in a stop and chop  technique and the remaining cortical material was removed with the irrigation-aspiration handpiece. The capsular bag was inflated with viscoelastic and the Technis [id number]  lens was placed in the capsular bag without complication. The remaining viscoelastic was removed from the eye with the irrigation-aspiration handpiece. The wounds were hydrated. The anterior chamber was flushed with Miostat and the eye was inflated to physiologic pressure. 0.12ml of Vigamox was placed in the anterior chamber. The wounds were found to be water tight. The eye was dressed with Vigamox. The patient was given protective glasses to wear throughout the day and a shield with which to  sleep tonight. The patient was also given drops with which to begin a drop regimen today and will follow-up with me in one day. Implant Name Type Inv. Item Serial No. Manufacturer Lot No. LRB No. Used  LENS IOL DIOP 18.5 - [id number] Intraocular Lens LENS IOL DIOP 18.5 [id number] AMO  Right 1   Procedure(s) with comments: CATARACT EXTRACTION PHACO AND INTRAOCULAR LENS PLACEMENT (IOC) (Right) - [place] 00:27 AP% 19.7 CDE5.32 fluid pack lot # [id number]  Electronically signed: Caymen Dubray LOUIS 10/18/2017 12:00 PM

## 2017-10-18 NOTE — Anesthesia Postprocedure Evaluation (Signed)
Anesthesia Post Note  Patient: Alexis James  Procedure(s) Performed: CATARACT EXTRACTION PHACO AND INTRAOCULAR LENS PLACEMENT (IOC) (Right Eye)  Patient location during evaluation: PACU Anesthesia Type: MAC Level of consciousness: awake, awake and alert and oriented Pain management: pain level controlled Vital Signs Assessment: post-procedure vital signs reviewed and stable Respiratory status: spontaneous breathing, nonlabored ventilation and respiratory function stable Cardiovascular status: stable Anesthetic complications: no     Last Vitals:  Vitals:   10/18/17 1107 10/18/17 1201  BP: (!) 111/52 (!) 107/47  Pulse:  67  Resp:  16  Temp:  (!) 36.4 C  SpO2:  100%    Last Pain:  Vitals:   10/18/17 1201  TempSrc: Temporal                 [employer]

## 2017-10-18 NOTE — Discharge Instructions (Signed)
Eye Surgery Discharge Instructions  Expect mild scratchy sensation or mild soreness. DO NOT RUB YOUR EYE!  The day of surgery:  Minimal physical activity, but bed rest is not required  No reading, computer work, or close hand work  No bending, lifting, or straining.  May watch TV  For 24 hours:  No driving, legal decisions, or alcoholic beverages  Safety precautions  Eat anything you prefer: It is better to start with liquids, then soup then solid foods.  _____ Eye patch should be worn until postoperative exam tomorrow.  ____ Solar shield eyeglasses should be worn for comfort in the sunlight/patch while sleeping  Resume all regular medications including aspirin or Coumadin if these were discontinued prior to surgery. You may shower, bathe, shave, or wash your hair. Tylenol may be taken for mild discomfort.  Call your doctor if you experience significant pain, nausea, or vomiting, fever > 101 or other signs of infection. [phone] or [phone] Specific instructions:  Follow-up Information    Birder Robson, MD Follow up.   Specialty:  Ophthalmology Why:  November 7 at 9:40am Contact information: [address] [place] [address] [phone]

## 2017-11-04 DIAGNOSIS — R0982 Postnasal drip: Secondary | ICD-10-CM | POA: Diagnosis not present

## 2017-11-04 DIAGNOSIS — J3089 Other allergic rhinitis: Secondary | ICD-10-CM | POA: Diagnosis not present

## 2017-11-10 ENCOUNTER — Encounter: Payer: Self-pay | Admitting: Internal Medicine

## 2017-11-10 ENCOUNTER — Ambulatory Visit: Payer: PPO | Admitting: Internal Medicine

## 2017-11-10 VITALS — BP 116/74 | HR 80 | Temp 98.0°F | Wt 140.0 lb

## 2017-11-10 DIAGNOSIS — R05 Cough: Secondary | ICD-10-CM

## 2017-11-10 DIAGNOSIS — R059 Cough, unspecified: Secondary | ICD-10-CM

## 2017-11-10 MED ORDER — PROMETHAZINE-DM 6.25-15 MG/5ML PO SYRP: 5.0000 mL | ORAL_SOLUTION | Freq: Four times a day (QID) | ORAL | 0 refills | Status: DC | PRN

## 2017-11-10 NOTE — Progress Notes (Signed)
HPI  Pt presents to the clinic today with c/o cough. This starte 1-2 weeks ago. The cough is productive of green mucous. She denies runny nose, nasal congestion, sore throat or shortness of breath. She has tried Flonase, and Deslym with minimal relief. She went to the [facility] 11/23 for the same, she was told she had a viral infection and treated with a steroid injection. Since that time, she reports she started feeling better yesterday but wanted to keep her appointment. She is going out of town and requesting cough medication.   Review of Systems      Past Medical History:  Diagnosis Date  . Anxiety   . Hyperlipidemia     Family History  Problem Relation Age of Onset  . Colitis Mother 34  . Mental retardation Father 42  . Cancer Neg Hx     Social History   Socioeconomic History  . Marital status: Married    Spouse name: Not on file  . Number of children: 3  . Years of education: Not on file  . Highest education level: Not on file  Social Needs  . Financial resource strain: Not on file  . Food insecurity - worry: Not on file  . Food insecurity - inability: Not on file  . Transportation needs - medical: Not on file  . Transportation needs - non-medical: Not on file  Occupational History  . Not on file  Tobacco Use  . Smoking status: Passive Smoke Exposure - Never Smoker  . Smokeless tobacco: Never Used  . Tobacco comment: Husband smoked x 38 years (quit 4 years ago)  Substance and Sexual Activity  . Alcohol use: Yes    Comment: Occasional  . Drug use: No  . Sexual activity: Not on file  Other Topics Concern  . Not on file  Social History Narrative  . Not on file    No Known Allergies   Constitutional: Denies headache, fatigue, fever or abrupt weight changes.  HEENT:  Denies eye redness, eye pain, pressure behind the eyes, facial pain, nasal congestion, ear pain, ringing in the ears, wax buildup, runny nose or sore throat. Respiratory: Positive cough. Denies  difficulty breathing or shortness of breath.  Cardiovascular: Denies chest pain, chest tightness, palpitations or swelling in the hands or feet.   No other specific complaints in a complete review of systems (except as listed in HPI above).  Objective:   BP 116/74   Pulse 80   Temp 98 F (36.7 C) (Oral)   Wt 140 lb (63.5 kg)   SpO2 98%   BMI 25.61 kg/m   Wt Readings from Last 3 Encounters:  10/18/17 140 lb (63.5 kg)  04/04/17 142 lb 6.4 oz (64.6 kg)  11/23/16 144 lb 3.2 oz (65.4 kg)     General: Appears her stated age, well developed, well nourished in NAD. HEENT: Head: normal shape and size, no sinus tenderness noted; Ears: Tm's gray and intact, normal light reflex; Throat/Mouth: Teeth present, mucosa pink and moist, no exudate noted, no lesions or ulcerations noted.  Neck: No cervical lymphadenopathy.  Pulmonary/Chest: Normal effort and positive vesicular breath sounds. No respiratory distress. No wheezes, rales or ronchi noted.       Assessment & Plan:   Cough:  Likely viral, no indication for abx Get some rest and drink plenty of water eRx for Promethazine DM cough syrup  RTC as needed or if symptoms persist.   Webb Silversmith, NP

## 2017-11-10 NOTE — Patient Instructions (Signed)

## 2017-11-23 DIAGNOSIS — L821 Other seborrheic keratosis: Secondary | ICD-10-CM | POA: Diagnosis not present

## 2017-11-23 DIAGNOSIS — D485 Neoplasm of uncertain behavior of skin: Secondary | ICD-10-CM | POA: Diagnosis not present

## 2017-11-23 DIAGNOSIS — C44529 Squamous cell carcinoma of skin of other part of trunk: Secondary | ICD-10-CM | POA: Diagnosis not present

## 2017-11-28 ENCOUNTER — Other Ambulatory Visit: Payer: Self-pay | Admitting: Internal Medicine

## 2017-12-30 DIAGNOSIS — L538 Other specified erythematous conditions: Secondary | ICD-10-CM | POA: Diagnosis not present

## 2017-12-30 DIAGNOSIS — D485 Neoplasm of uncertain behavior of skin: Secondary | ICD-10-CM | POA: Diagnosis not present

## 2017-12-30 DIAGNOSIS — L298 Other pruritus: Secondary | ICD-10-CM | POA: Diagnosis not present

## 2017-12-30 DIAGNOSIS — R208 Other disturbances of skin sensation: Secondary | ICD-10-CM | POA: Diagnosis not present

## 2018-03-08 ENCOUNTER — Other Ambulatory Visit: Payer: Self-pay | Admitting: Internal Medicine

## 2018-03-24 DIAGNOSIS — L304 Erythema intertrigo: Secondary | ICD-10-CM | POA: Diagnosis not present

## 2018-03-24 DIAGNOSIS — L910 Hypertrophic scar: Secondary | ICD-10-CM | POA: Diagnosis not present

## 2018-03-24 DIAGNOSIS — Z85828 Personal history of other malignant neoplasm of skin: Secondary | ICD-10-CM | POA: Diagnosis not present

## 2018-03-24 DIAGNOSIS — X32XXXA Exposure to sunlight, initial encounter: Secondary | ICD-10-CM | POA: Diagnosis not present

## 2018-03-24 DIAGNOSIS — L570 Actinic keratosis: Secondary | ICD-10-CM | POA: Diagnosis not present

## 2018-03-24 DIAGNOSIS — Z08 Encounter for follow-up examination after completed treatment for malignant neoplasm: Secondary | ICD-10-CM | POA: Diagnosis not present

## 2018-03-24 DIAGNOSIS — D1801 Hemangioma of skin and subcutaneous tissue: Secondary | ICD-10-CM | POA: Diagnosis not present

## 2018-04-05 ENCOUNTER — Encounter: Payer: Self-pay | Admitting: Internal Medicine

## 2018-04-05 ENCOUNTER — Ambulatory Visit (INDEPENDENT_AMBULATORY_CARE_PROVIDER_SITE_OTHER): Payer: PPO | Admitting: Internal Medicine

## 2018-04-05 VITALS — BP 104/60 | HR 74 | Temp 98.2°F | Resp 14 | Ht 62.25 in | Wt 137.4 lb

## 2018-04-05 DIAGNOSIS — Z0000 Encounter for general adult medical examination without abnormal findings: Secondary | ICD-10-CM | POA: Diagnosis not present

## 2018-04-05 DIAGNOSIS — R7301 Impaired fasting glucose: Secondary | ICD-10-CM | POA: Diagnosis not present

## 2018-04-05 DIAGNOSIS — Z23 Encounter for immunization: Secondary | ICD-10-CM | POA: Diagnosis not present

## 2018-04-05 DIAGNOSIS — E782 Mixed hyperlipidemia: Secondary | ICD-10-CM

## 2018-04-05 DIAGNOSIS — R7303 Prediabetes: Secondary | ICD-10-CM

## 2018-04-05 DIAGNOSIS — R21 Rash and other nonspecific skin eruption: Secondary | ICD-10-CM

## 2018-04-05 LAB — COMPREHENSIVE METABOLIC PANEL
ALT: 16 U/L (ref 0–35)
AST: 22 U/L (ref 0–37)
Albumin: 4 g/dL (ref 3.5–5.2)
Alkaline Phosphatase: 59 U/L (ref 39–117)
BILIRUBIN TOTAL: 0.5 mg/dL (ref 0.2–1.2)
BUN: 20 mg/dL (ref 6–23)
CALCIUM: 9.5 mg/dL (ref 8.4–10.5)
CHLORIDE: 104 meq/L (ref 96–112)
CO2: 29 meq/L (ref 19–32)
Creatinine, Ser: 0.79 mg/dL (ref 0.40–1.20)
GFR: 76.95 mL/min (ref >60.00)
Glucose, Bld: 93 mg/dL (ref 70–99)
Potassium: 4.5 mEq/L (ref 3.5–5.1)
Sodium: 139 mEq/L (ref 135–145)
Total Protein: 6.7 g/dL (ref 6.0–8.3)

## 2018-04-05 LAB — LIPID PANEL
CHOL/HDL RATIO: 3
Cholesterol: 188 mg/dL (ref 0–200)
HDL: 69.7 mg/dL (ref >39.00)
LDL CALC: 97 mg/dL (ref 0–99)
NonHDL: 118.77
TRIGLYCERIDES: 107 mg/dL (ref 0.0–149.0)
VLDL: 21.4 mg/dL (ref 0.0–40.0)

## 2018-04-05 LAB — HEMOGLOBIN A1C: Hgb A1c MFr Bld: 6.2 % (ref 4.6–6.5)

## 2018-04-05 MED ORDER — NYSTATIN 100000 UNIT/GM EX POWD: Freq: Two times a day (BID) | CUTANEOUS | 1 refills | Status: DC

## 2018-04-05 NOTE — Patient Instructions (Signed)
Your rash may be from a yeast infection  Stop the vaseline and cortisone cream  Use the nystatint powder twice daily and tuck a soft 4 x 4 pad under breast to help keep area dry  Once resolved,  Use gold bond medicated powder with zinc to prevent recurrence  You received the Pneumonia vaccine today  Mammogram ordered  Health Maintenance for Postmenopausal Women Menopause is a normal process in which your reproductive ability comes to an end. This process happens gradually over a span of months to years, usually between the ages of 76 and 33. Menopause is complete when you have missed 12 consecutive menstrual periods. It is important to talk with your health care provider about some of the most common conditions that affect postmenopausal women, such as heart disease, cancer, and bone loss (osteoporosis). Adopting a healthy lifestyle and getting preventive care can help to promote your health and wellness. Those actions can also lower your chances of developing some of these common conditions. What should I know about menopause? During menopause, you may experience a number of symptoms, such as:  Moderate-to-severe hot flashes.  Night sweats.  Decrease in sex drive.  Mood swings.  Headaches.  Tiredness.  Irritability.  Memory problems.  Insomnia.  Choosing to treat or not to treat menopausal changes is an individual decision that you make with your health care provider. What should I know about hormone replacement therapy and supplements? Hormone therapy products are effective for treating symptoms that are associated with menopause, such as hot flashes and night sweats. Hormone replacement carries certain risks, especially as you become older. If you are thinking about using estrogen or estrogen with progestin treatments, discuss the benefits and risks with your health care provider. What should I know about heart disease and stroke? Heart disease, heart attack, and stroke  become more likely as you age. This may be due, in part, to the hormonal changes that your body experiences during menopause. These can affect how your body processes dietary fats, triglycerides, and cholesterol. Heart attack and stroke are both medical emergencies. There are many things that you can do to help prevent heart disease and stroke:  Have your blood pressure checked at least every 1-2 years. High blood pressure causes heart disease and increases the risk of stroke.  If you are 6-19 years old, ask your health care provider if you should take aspirin to prevent a heart attack or a stroke.  Do not use any tobacco products, including cigarettes, chewing tobacco, or electronic cigarettes. If you need help quitting, ask your health care provider.  It is important to eat a healthy diet and maintain a healthy weight. ? Be sure to include plenty of vegetables, fruits, low-fat dairy products, and lean protein. ? Avoid eating foods that are high in solid fats, added sugars, or salt (sodium).  Get regular exercise. This is one of the most important things that you can do for your health. ? Try to exercise for at least 150 minutes each week. The type of exercise that you do should increase your heart rate and make you sweat. This is known as moderate-intensity exercise. ? Try to do strengthening exercises at least twice each week. Do these in addition to the moderate-intensity exercise.  Know your numbers.Ask your health care provider to check your cholesterol and your blood glucose. Continue to have your blood tested as directed by your health care provider.  What should I know about cancer screening? There are several types of  cancer. Take the following steps to reduce your risk and to catch any cancer development as early as possible. Breast Cancer  Practice breast self-awareness. ? This means understanding how your breasts normally appear and feel. ? It also means doing regular breast  self-exams. Let your health care provider know about any changes, no matter how small.  If you are 46 or older, have a clinician do a breast exam (clinical breast exam or CBE) every year. Depending on your age, family history, and medical history, it may be recommended that you also have a yearly breast X-ray (mammogram).  If you have a family history of breast cancer, talk with your health care provider about genetic screening.  If you are at high risk for breast cancer, talk with your health care provider about having an MRI and a mammogram every year.  Breast cancer (BRCA) gene test is recommended for women who have family members with BRCA-related cancers. Results of the assessment will determine the need for genetic counseling and BRCA1 and for BRCA2 testing. BRCA-related cancers include these types: ? Breast. This occurs in males or females. ? Ovarian. ? Tubal. This may also be called fallopian tube cancer. ? Cancer of the abdominal or pelvic lining (peritoneal cancer). ? Prostate. ? Pancreatic.  Cervical, Uterine, and Ovarian Cancer Your health care provider may recommend that you be screened regularly for cancer of the pelvic organs. These include your ovaries, uterus, and vagina. This screening involves a pelvic exam, which includes checking for microscopic changes to the surface of your cervix (Pap test).  For women ages 21-65, health care providers may recommend a pelvic exam and a Pap test every three years. For women ages 58-65, they may recommend the Pap test and pelvic exam, combined with testing for human papilloma virus (HPV), every five years. Some types of HPV increase your risk of cervical cancer. Testing for HPV may also be done on women of any age who have unclear Pap test results.  Other health care providers may not recommend any screening for nonpregnant women who are considered low risk for pelvic cancer and have no symptoms. Ask your health care provider if a screening  pelvic exam is right for you.  If you have had past treatment for cervical cancer or a condition that could lead to cancer, you need Pap tests and screening for cancer for at least 20 years after your treatment. If Pap tests have been discontinued for you, your risk factors (such as having a new sexual partner) need to be reassessed to determine if you should start having screenings again. Some women have medical problems that increase the chance of getting cervical cancer. In these cases, your health care provider may recommend that you have screening and Pap tests more often.  If you have a family history of uterine cancer or ovarian cancer, talk with your health care provider about genetic screening.  If you have vaginal bleeding after reaching menopause, tell your health care provider.  There are currently no reliable tests available to screen for ovarian cancer.  Lung Cancer Lung cancer screening is recommended for adults 88-48 years old who are at high risk for lung cancer because of a history of smoking. A yearly low-dose CT scan of the lungs is recommended if you:  Currently smoke.  Have a history of at least 30 pack-years of smoking and you currently smoke or have quit within the past 15 years. A pack-year is smoking an average of one pack of  cigarettes per day for one year.  Yearly screening should:  Continue until it has been 15 years since you quit.  Stop if you develop a health problem that would prevent you from having lung cancer treatment.  Colorectal Cancer  This type of cancer can be detected and can often be prevented.  Routine colorectal cancer screening usually begins at age 76 and continues through age 5.  If you have risk factors for colon cancer, your health care provider may recommend that you be screened at an earlier age.  If you have a family history of colorectal cancer, talk with your health care provider about genetic screening.  Your health care  provider may also recommend using home test kits to check for hidden blood in your stool.  A small camera at the end of a tube can be used to examine your colon directly (sigmoidoscopy or colonoscopy). This is done to check for the earliest forms of colorectal cancer.  Direct examination of the colon should be repeated every 5-10 years until age 16. However, if early forms of precancerous polyps or small growths are found or if you have a family history or genetic risk for colorectal cancer, you may need to be screened more often.  Skin Cancer  Check your skin from head to toe regularly.  Monitor any moles. Be sure to tell your health care provider: ? About any new moles or changes in moles, especially if there is a change in a mole's shape or color. ? If you have a mole that is larger than the size of a pencil eraser.  If any of your family members has a history of skin cancer, especially at a young age, talk with your health care provider about genetic screening.  Always use sunscreen. Apply sunscreen liberally and repeatedly throughout the day.  Whenever you are outside, protect yourself by wearing long sleeves, pants, a wide-brimmed hat, and sunglasses.  What should I know about osteoporosis? Osteoporosis is a condition in which bone destruction happens more quickly than new bone creation. After menopause, you may be at an increased risk for osteoporosis. To help prevent osteoporosis or the bone fractures that can happen because of osteoporosis, the following is recommended:  If you are 31-47 years old, get at least 1,000 mg of calcium and at least 600 mg of vitamin D per day.  If you are older than age 78 but younger than age 57, get at least 1,200 mg of calcium and at least 600 mg of vitamin D per day.  If you are older than age 44, get at least 1,200 mg of calcium and at least 800 mg of vitamin D per day.  Smoking and excessive alcohol intake increase the risk of osteoporosis. Eat  foods that are rich in calcium and vitamin D, and do weight-bearing exercises several times each week as directed by your health care provider. What should I know about how menopause affects my mental health? Depression may occur at any age, but it is more common as you become older. Common symptoms of depression include:  Low or sad mood.  Changes in sleep patterns.  Changes in appetite or eating patterns.  Feeling an overall lack of motivation or enjoyment of activities that you previously enjoyed.  Frequent crying spells.  Talk with your health care provider if you think that you are experiencing depression. What should I know about immunizations? It is important that you get and maintain your immunizations. These include:  Tetanus, diphtheria,  and pertussis (Tdap) booster vaccine.  Influenza every year before the flu season begins.  Pneumonia vaccine.  Shingles vaccine.  Your health care provider may also recommend other immunizations. This information is not intended to replace advice given to you by your health care provider. Make sure you discuss any questions you have with your health care provider. Document Released: 01/21/2006 Document Revised: 06/18/2016 Document Reviewed: 09/02/2015 Elsevier Interactive Patient Education  2018 [employer].

## 2018-04-05 NOTE — Progress Notes (Addendum)
Patient ID: Alexis James, female    DOB: [birth year]  Age: 67 y.o. MRN: [id number]  The patient is here for annual PREVENTIVE examination and management of other chronic and acute problems.  Health Maintenance:  Mammogram June 2018 Colon normal 2012 Needs prevnar  Saw Dermatology last week . 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  1 yr follow up HAD cataract surgery last June right eye,  Left to be done this summer June by Porfilio  The risk factors are reflected in the social history.  The roster of all physicians providing medical care to patient - is listed in the Snapshot section of the chart.  Activities of daily living:  The patient is 100% independent in all ADLs: dressing, toileting, feeding as well as independent mobility  Home safety : The patient has smoke detectors in the home. They wear seatbelts.  There are no firearms at home. There is no violence in the home.   There is no risks for hepatitis, STDs or HIV. There is no   history of blood transfusion. They have no travel history to infectious disease endemic areas of the world.  The patient has seen their dentist in the last six month. They have seen their eye doctor in the last year. She denies hearing difficulty with regard to whispered voices and television programs.  She has had adiologic testing in the last year by Anda Latina   .  They do not  have excessive sun exposure. Discussed the need for sun protection: hats, long sleeves and use of sunscreen if there is significant sun exposure.   Diet: the importance of a healthy diet is discussed. They do have a healthy diet.  The benefits of regular aerobic exercise were discussed. She walks 4 times per week ,  20 minutes.   Depression screen: there are no signs or vegative symptoms of depression- irritability, change in appetite, anhedonia, sadness/tearfullness.  Cognitive assessment: the patient manages all their financial and  personal affairs and is actively engaged. They could relate day,date,year and events; recalled 2/3 objects at 3 minutes; performed clock-face test normally.  The following portions of the patient's history were reviewed and updated as appropriate: allergies, current medications, past family history, past medical history,  past surgical history, past social history  and problem list.  Visual acuity was not assessed per patient preference since she has regular follow up with her ophthalmologist. Hearing and body mass index were assessed and reviewed.   During the course of the visit the patient was educated and counseled about appropriate screening and preventive services including : fall prevention , diabetes screening, nutrition counseling, colorectal cancer screening, and recommended immunizations.    CC: The primary encounter diagnosis was Impaired fasting glucose. Diagnoses of Mixed hyperlipidemia, Need for vaccination with 13-polyvalent pneumococcal conjugate vaccine, Encounter for preventive health examination, Prediabetes, and Localized macular rash were also pertinent to this visit.  Red rash under left breast that is recurrent after transiently improving with hydrocortisone   History Alexis James has a past medical history of Anxiety and Hyperlipidemia.   She has a past surgical history that includes Cesarean section; Breast surgery; Reduction mammaplasty; and Cataract extraction w/PHACO (Right, 10/18/2017).   Her family history includes Colitis (age of onset: 54) in her mother; Mental retardation (age of onset: 43) in her father.She reports that she is a non-smoker but has been exposed to tobacco smoke. She has never used smokeless tobacco. She reports  that she drinks alcohol. She reports that she does not use drugs.  Outpatient Medications Prior to Visit  Medication Sig Dispense Refill  . aspirin EC 81 MG tablet Take 81 mg by mouth 2 (two) times a week. Tues and Fri    . Calcium  Carb-Cholecalciferol (CALCIUM 600 + D PO) Take 1 tablet by mouth 2 (two) times daily.    . calcium carbonate (TUMS - DOSED IN MG ELEMENTAL CALCIUM) 500 MG chewable tablet Chew 2 tablets by mouth daily as needed for indigestion or heartburn.    . Coenzyme Q10 (COQ-10) 100 MG CAPS Take 100 mg by mouth daily.    . DiphenhydrAMINE HCl, Sleep, (ZZZQUIL) 25 MG CAPS Take 25 mg by mouth at bedtime as needed (sleep).    . diphenhydramine-acetaminophen (TYLENOL PM) 25-500 MG TABS tablet Take 1 tablet by mouth at bedtime as needed (sleep).     Marland Kitchen escitalopram (LEXAPRO) 5 MG tablet TAKE 1 TABLET BY MOUTH ONCE DAILY 90 tablet 1  . fluticasone (FLONASE) 50 MCG/ACT nasal spray Place 1 spray into both nostrils daily as needed for allergies.     Marland Kitchen ibuprofen (ADVIL,MOTRIN) 200 MG tablet Take 200 mg by mouth 2 (two) times daily as needed for headache or moderate pain.    Marland Kitchen lovastatin (MEVACOR) 40 MG tablet TAKE 1 TABLET BY MOUTH AT BEDTIME 90 tablet 1  . Melatonin 5 MG TBDP Take 5 mg by mouth at bedtime as needed (sleep).    . promethazine-dextromethorphan (PROMETHAZINE-DM) 6.25-15 MG/5ML syrup Take 5 mLs by mouth 4 (four) times daily as needed for cough. 118 mL 0   No facility-administered medications prior to visit.     Review of Systems   Patient denies headache, fevers, malaise, unintentional weight loss, skin rash, eye pain, sinus congestion and sinus pain, sore throat, dysphagia,  hemoptysis , cough, dyspnea, wheezing, chest pain, palpitations, orthopnea, edema, abdominal pain, nausea, melena, diarrhea, constipation, flank pain, dysuria, hematuria, urinary  Frequency, nocturia, numbness, tingling, seizures,  Focal weakness, Loss of consciousness,  Tremor, insomnia, depression, anxiety, and suicidal ideation.      Objective:  BP 104/60 (BP Location: Left Arm, Patient Position: Sitting, Cuff Size: Normal)   Pulse 74   Temp 98.2 F (36.8 C) (Oral)   Resp 14   Ht 5' 2.25" (1.581 m)   Wt 137 lb 6.4 oz (62.3  kg)   SpO2 93%   BMI 24.93 kg/m   Physical Exam  General appearance: alert, cooperative and appears stated age Head: Normocephalic, without obvious abnormality, atraumatic Eyes: conjunctivae/corneas clear. PERRL, EOM's intact. Fundi benign. Ears: normal TM's and external ear canals both ears Nose: Nares normal. Septum midline. Mucosa normal. No drainage or sinus tenderness. Throat: lips, mucosa, and tongue normal; teeth and gums normal Neck: no adenopathy, no carotid bruit, no JVD, supple, symmetrical, trachea midline and thyroid not enlarged, symmetric, no tenderness/mass/nodules Lungs: clear to auscultation bilaterally Breasts: normal appearance, no masses or tenderness. Macular rash under right breast Heart: regular rate and rhythm, S1, S2 normal, no murmur, click, rub or gallop Abdomen: soft, non-tender; bowel sounds normal; no masses,  no organomegaly Extremities: extremities normal, atraumatic, no cyanosis or edema Pulses: 2+ and symmetric Skin: diffuse macular erythematous rash under left breast with distinct borders. Neurologic: Alert and oriented X 3, normal strength and tone. Normal symmetric reflexes. Normal coordination and gait.     Assessment & Plan:   Problem List Items Addressed This Visit    Prediabetes    Her random glucose  is again elevated but not diagnostic of diabetes .  I recommend he follow a low glycemic index diet and particpate regularly in an aerobic  exercise activity.  We should check an A1c in 6 months.   Lab Results  Component Value Date   HGBA1C 6.2 04/05/2018         Localized macular rash    Will treat empirically for candida with nystatin powder       Hyperlipidemia    .LDL and triglycerides are at goal on current medications. She  has no side effects and liver enzymes are normal. No changes today  Lab Results  Component Value Date   CHOL 188 04/05/2018   HDL 69.70 04/05/2018   LDLCALC 97 04/05/2018   LDLDIRECT 94.3 03/26/2014    TRIG 107.0 04/05/2018   CHOLHDL 3 04/05/2018          Relevant Orders   Lipid panel (Completed)   Encounter for preventive health examination    Annual comprehensive preventive exam was done as well as an evaluation and management of chronic conditions .  During the course of the visit the patient was educated and counseled about appropriate screening and preventive services including :  diabetes screening, lipid analysis with projected  10 year  risk for CAD , nutrition counseling, breast, cervical and colorectal cancer screening, and recommended immunizations.  Printed recommendations for health maintenance screenings was give       Other Visit Diagnoses    Impaired fasting glucose    -  Primary   Relevant Orders   Hemoglobin A1c (Completed)   Comprehensive metabolic panel (Completed)   Need for vaccination with 13-polyvalent pneumococcal conjugate vaccine       Relevant Orders   Pneumococcal conjugate vaccine 13-valent IM (Completed)      I am having Alexis James. Alexis James start on nystatin. I am also having her maintain her aspirin EC, fluticasone, diphenhydramine-acetaminophen, Calcium Carb-Cholecalciferol (CALCIUM 600 + D PO), CoQ-10, Melatonin, diphenhydrAMINE HCl (Sleep), calcium carbonate, ibuprofen, promethazine-dextromethorphan, lovastatin, and escitalopram.  Meds ordered this encounter  Medications  . nystatin (NYSTATIN) powder    Sig: Apply topically 2 (two) times daily. To affected area.    Dispense:  15 g    Refill:  1    There are no discontinued medications.  Follow-up: No follow-ups on file.   Crecencio Mc, MD

## 2018-04-06 ENCOUNTER — Encounter: Payer: Self-pay | Admitting: Internal Medicine

## 2018-04-06 DIAGNOSIS — R7303 Prediabetes: Secondary | ICD-10-CM | POA: Insufficient documentation

## 2018-04-06 DIAGNOSIS — R69 Illness, unspecified: Secondary | ICD-10-CM | POA: Diagnosis not present

## 2018-04-08 DIAGNOSIS — R21 Rash and other nonspecific skin eruption: Secondary | ICD-10-CM | POA: Insufficient documentation

## 2018-04-08 NOTE — Assessment & Plan Note (Signed)
Will treat empirically for candida with nystatin powder

## 2018-04-08 NOTE — Assessment & Plan Note (Signed)
Annual comprehensive preventive exam was done as well as an evaluation and management of chronic conditions .  During the course of the visit the patient was educated and counseled about appropriate screening and preventive services including :  diabetes screening, lipid analysis with projected  10 year  risk for CAD , nutrition counseling, breast, cervical and colorectal cancer screening, and recommended immunizations.  Printed recommendations for health maintenance screenings was give 

## 2018-04-08 NOTE — Assessment & Plan Note (Signed)
.  LDL and triglycerides are at goal on current medications. She  has no side effects and liver enzymes are normal. No changes today  Lab Results  Component Value Date   CHOL 188 04/05/2018   HDL 69.70 04/05/2018   LDLCALC 97 04/05/2018   LDLDIRECT 94.3 03/26/2014   TRIG 107.0 04/05/2018   CHOLHDL 3 04/05/2018

## 2018-04-08 NOTE — Assessment & Plan Note (Signed)
Her random glucose is again elevated but not diagnostic of diabetes .  I recommend he follow a low glycemic index diet and particpate regularly in an aerobic  exercise activity.  We should check an A1c in 6 months.   Lab Results  Component Value Date   HGBA1C 6.2 04/05/2018

## 2018-04-11 ENCOUNTER — Telehealth: Payer: Self-pay

## 2018-04-11 NOTE — Telephone Encounter (Signed)
Called and reviewed patient labs. Patient verbalized understanding of results.  Copied from [place] [phone]. Topic: Quick Communication - Lab Results >> Apr 11, 2018  9:43 AM Marja Kays F wrote: Pt is looking for lab results from 04/10/18   Best number [phone]

## 2018-04-16 ENCOUNTER — Encounter: Payer: Self-pay | Admitting: Internal Medicine

## 2018-04-28 ENCOUNTER — Telehealth: Payer: Self-pay

## 2018-04-28 ENCOUNTER — Other Ambulatory Visit: Payer: Self-pay | Admitting: Internal Medicine

## 2018-04-28 DIAGNOSIS — Z1231 Encounter for screening mammogram for malignant neoplasm of breast: Secondary | ICD-10-CM

## 2018-04-28 NOTE — Telephone Encounter (Addendum)
You can try the cortisone cream on the crusty place behind yourear.  I will see you on Monday as plANNED for the breast rash

## 2018-04-28 NOTE — Telephone Encounter (Signed)
mychart message sent

## 2018-04-28 NOTE — Telephone Encounter (Signed)
Copied from [place] [phone]. Topic: General - Other >> Apr 28, 2018  9:04 AM Yvette Rack wrote: Reason for CRM: patient states that her rt breast is still red and itchy and the powder doesn't work that much it went away some but still there also behind one of her ears feel crusty it resembles like the rash under her breast pt would like a call back

## 2018-04-28 NOTE — Telephone Encounter (Signed)
Spoke with pt and she stated that her left breast is still red and itchy. She stated that the itchiness really only occurs once she has touched the breast or washed it. She stated that she has tried the powder, Vaseline, and cortisone cream. Pt has been scheduled for Monday 05/01/2018 at 5pm.

## 2018-05-01 ENCOUNTER — Ambulatory Visit (INDEPENDENT_AMBULATORY_CARE_PROVIDER_SITE_OTHER): Payer: PPO | Admitting: Internal Medicine

## 2018-05-01 ENCOUNTER — Encounter: Payer: Self-pay | Admitting: Internal Medicine

## 2018-05-01 DIAGNOSIS — R21 Rash and other nonspecific skin eruption: Secondary | ICD-10-CM

## 2018-05-01 DIAGNOSIS — R7303 Prediabetes: Secondary | ICD-10-CM | POA: Diagnosis not present

## 2018-05-01 MED ORDER — TRIAMCINOLONE ACETONIDE 0.1 % EX CREA
1.0000 | TOPICAL_CREAM | Freq: Two times a day (BID) | CUTANEOUS | 0 refills | Status: DC
Start: 2018-05-01 — End: 2018-10-12

## 2018-05-01 NOTE — Patient Instructions (Addendum)
Low carb Breakfast options:  Premier Protein  Atkins Advantage Muscle Milk EAS AdvantEdge   All of these are available at [employer], [place],  [place] a And taste good   Danton Clap "D'Light" frozen entrees:    Frittata  : muffin shaped quiches that contain eggs + cheese + veggies+ meat (no bread) Egg'wich :  [place] sausage patty served on a biscuit made of frittat (no bread)   Both can be microwaved in 2 minutes   Truett Perna:  Just Crack n Egg: yogurt sized cup ith diced ham, cheese and potatoes: add one egg and microwave for 2 minutes   For lunch:   [employer] bagged salads:  Try  the Asian, the SouthWestern,  And the Caesar salads, complete with dressings, nuts and croutons .  Found  in the bagged salad section   Just add a protein (tuna,  Chicken etc ) and omit the won ton /tortilla strips   2) There are plenty of high protein  HIGH  carb cookies,  But only the following are low carb."  Look for them in the diet section of most grocery stores or vitamin shops,  where the protein shakes are  sold.   All of these have 5 g sugar varieties if you read the label  : Power crunch Atkins bars KIND : make sure you find the  "low glycemic index"  variety QUEST : (taste better after being microwaved for 5 sec OUT OF THE WRAPPER)  3)   To make a low carb chip :  Take the Joseph's Lavash or Pita bread,  Or the Mission Low carb whole wheat tortilla   Place on metal cookie sheet  Brush with olive oil  Sprinkle garlic powder (NOT garlic salt), grated parmesan cheese, mediterranean seasoning , or all of them?  Bake at 275 for 30 minutes   We have substitutions for your potatoes!!  Try the mashed cauliflower and riced cauliflower dishes instead of rice and mashed potatoes  Mashed turnips are also very low carb!     HERE ARE THE LOW CARB  BREAD CHOICES          Low carb ice cream  Has become more popular and there are many more choices :  Breyer's  carb smart dudgsicle an ice cream bars   Halo Top  E enlightenment (Sea Salt caramel)   Yazzo  frozen yogurt   Skinny Cow  Weight watchers

## 2018-05-01 NOTE — Progress Notes (Signed)
Subjective:  Patient ID: Alexis James, female    DOB: May 08, 1951  Age: 67 y.o. MRN: [id number]  CC: Diagnoses of Localized macular rash and Prediabetes were pertinent to this visit.  HPI Alexis James presents for persistent macular rash under  Her left breast,  In the skin crease, which did not improve with nystatin . She had a difficult time keeping the area dry due to sweating.  The rash is macular,  And slightly pruritic but usually only after showering ,  It has not spread.  She has not changed detergents,  Lotions,  Or worn any new bras.  Wears underwire bras chronically. She states that Dr Kellie Moor evaluated the rash but was not concerned   Outpatient Medications Prior to Visit  Medication Sig Dispense Refill  . aspirin EC 81 MG tablet Take 81 mg by mouth 2 (two) times a week. Tues and Fri    . Calcium Carb-Cholecalciferol (CALCIUM 600 + D PO) Take 1 tablet by mouth 2 (two) times daily.    . calcium carbonate (TUMS - DOSED IN MG ELEMENTAL CALCIUM) 500 MG chewable tablet Chew 2 tablets by mouth daily as needed for indigestion or heartburn.    . Coenzyme Q10 (COQ-10) 100 MG CAPS Take 100 mg by mouth daily.    . DiphenhydrAMINE HCl, Sleep, (ZZZQUIL) 25 MG CAPS Take 25 mg by mouth at bedtime as needed (sleep).    Marland Kitchen escitalopram (LEXAPRO) 5 MG tablet TAKE 1 TABLET BY MOUTH ONCE DAILY 90 tablet 1  . fluticasone (FLONASE) 50 MCG/ACT nasal spray Place 1 spray into both nostrils daily as needed for allergies.     Marland Kitchen ibuprofen (ADVIL,MOTRIN) 200 MG tablet Take 200 mg by mouth 2 (two) times daily as needed for headache or moderate pain.    Marland Kitchen lovastatin (MEVACOR) 40 MG tablet TAKE 1 TABLET BY MOUTH AT BEDTIME 90 tablet 1  . nystatin (NYSTATIN) powder Apply topically 2 (two) times daily. To affected area. 15 g 1  . promethazine-dextromethorphan (PROMETHAZINE-DM) 6.25-15 MG/5ML syrup Take 5 mLs by mouth 4 (four) times daily as needed for cough. 118 mL 0  . diphenhydramine-acetaminophen (TYLENOL PM)  25-500 MG TABS tablet Take 1 tablet by mouth at bedtime as needed (sleep).     . Melatonin 5 MG TBDP Take 5 mg by mouth at bedtime as needed (sleep).     No facility-administered medications prior to visit.     Review of Systems;  Patient denies headache, fevers, malaise, unintentional weight loss, skin rash, eye pain, sinus congestion and sinus pain, sore throat, dysphagia,  hemoptysis , cough, dyspnea, wheezing, chest pain, palpitations, orthopnea, edema, abdominal pain, nausea, melena, diarrhea, constipation, flank pain, dysuria, hematuria, urinary  Frequency, nocturia, numbness, tingling, seizures,  Focal weakness, Loss of consciousness,  Tremor, insomnia, depression, anxiety, and suicidal ideation.      Objective:  BP 120/74 (BP Location: Left Arm, Patient Position: Sitting, Cuff Size: Normal)   Pulse 78   Temp 98.3 F (36.8 C) (Oral)   Resp 15   Ht 5' 2.25" (1.581 m)   Wt 136 lb 6.4 oz (61.9 kg)   SpO2 93%   BMI 24.75 kg/m   BP Readings from Last 3 Encounters:  05/01/18 120/74  04/05/18 104/60  11/10/17 116/74    Wt Readings from Last 3 Encounters:  05/01/18 136 lb 6.4 oz (61.9 kg)  04/05/18 137 lb 6.4 oz (62.3 kg)  11/10/17 140 lb (63.5 kg)    General appearance: alert, cooperative and  appears stated age Ears: normal TM's and external ear canals both ears Throat: lips, mucosa, and tongue normal; teeth and gums normal Skin: macular blanching linear rash under left breast , confined to the skin crease. Lymph nodes: Cervical, supraclavicular, and axillary nodes normal.  Lab Results  Component Value Date   HGBA1C 6.2 04/05/2018    Lab Results  Component Value Date   CREATININE 0.79 04/05/2018   CREATININE 0.83 10/05/2017   CREATININE 0.87 04/04/2017    Lab Results  Component Value Date   WBC 3.7 (L) 04/02/2016   HGB 12.3 04/02/2016   HCT 36.5 04/02/2016   PLT 362.0 04/02/2016   GLUCOSE 93 04/05/2018   CHOL 188 04/05/2018   TRIG 107.0 04/05/2018   HDL  69.70 04/05/2018   LDLDIRECT 94.3 03/26/2014   LDLCALC 97 04/05/2018   ALT 16 04/05/2018   AST 22 04/05/2018   NA 139 04/05/2018   K 4.5 04/05/2018   CL 104 04/05/2018   CREATININE 0.79 04/05/2018   BUN 20 04/05/2018   CO2 29 04/05/2018   TSH 3.16 04/04/2017   HGBA1C 6.2 04/05/2018    No results found.  Assessment & Plan:   Problem List Items Addressed This Visit    Prediabetes    A total of 25 minutes of face to face time was spent with patient more than half of which was spent in counselling about the benefits of a  low glycemic index diet and regular particpation in an aerobic  exercise activities.  Patient was advised to return for repeat an A1c in 6 months.   Lab Results  Component Value Date   HGBA1C 6.2 04/05/2018         Localized macular rash    No change with empiric nystatin bid x 2 weeks.  Trial of triamcinolone.         I am having Lisbeth Renshaw. Bojanowski start on triamcinolone cream. I am also having her maintain her aspirin EC, fluticasone, diphenhydramine-acetaminophen, Calcium Carb-Cholecalciferol (CALCIUM 600 + D PO), CoQ-10, Melatonin, diphenhydrAMINE HCl (Sleep), calcium carbonate, ibuprofen, promethazine-dextromethorphan, lovastatin, escitalopram, and nystatin.  Meds ordered this encounter  Medications  . triamcinolone cream (KENALOG) 0.1 %    Sig: Apply 1 application topically 2 (two) times daily. To rash    Dispense:  45 g    Refill:  0    There are no discontinued medications.  Follow-up: Return in about 6 months (around 11/01/2018).   Crecencio Mc, MD

## 2018-05-02 NOTE — Assessment & Plan Note (Signed)
No change with empiric nystatin bid x 2 weeks.  Trial of triamcinolone.

## 2018-05-02 NOTE — Assessment & Plan Note (Signed)
A total of 25 minutes of face to face time was spent with patient more than half of which was spent in counselling about the benefits of a  low glycemic index diet and regular particpation in an aerobic  exercise activities.  Patient was advised to return for repeat an A1c in 6 months.   Lab Results  Component Value Date   HGBA1C 6.2 04/05/2018

## 2018-06-07 ENCOUNTER — Other Ambulatory Visit: Payer: Self-pay | Admitting: Internal Medicine

## 2018-07-10 DIAGNOSIS — H2512 Age-related nuclear cataract, left eye: Secondary | ICD-10-CM | POA: Diagnosis not present

## 2018-07-10 LAB — HM DIABETES EYE EXAM

## 2018-07-19 DIAGNOSIS — C44301 Unspecified malignant neoplasm of skin of nose: Secondary | ICD-10-CM | POA: Diagnosis not present

## 2018-07-19 DIAGNOSIS — D485 Neoplasm of uncertain behavior of skin: Secondary | ICD-10-CM | POA: Diagnosis not present

## 2018-08-07 ENCOUNTER — Ambulatory Visit
Admission: RE | Admit: 2018-08-07 | Discharge: 2018-08-07 | Disposition: A | Discharge status: Home / Self Care | Payer: PPO | Source: Ambulatory Visit | Attending: Internal Medicine | Admitting: Internal Medicine

## 2018-08-07 DIAGNOSIS — Z1231 Encounter for screening mammogram for malignant neoplasm of breast: Secondary | ICD-10-CM | POA: Diagnosis not present

## 2018-09-11 DIAGNOSIS — Z85828 Personal history of other malignant neoplasm of skin: Secondary | ICD-10-CM | POA: Diagnosis not present

## 2018-09-11 DIAGNOSIS — C44321 Squamous cell carcinoma of skin of nose: Secondary | ICD-10-CM | POA: Diagnosis not present

## 2018-09-19 ENCOUNTER — Other Ambulatory Visit: Payer: Self-pay | Admitting: Internal Medicine

## 2018-09-25 DIAGNOSIS — Z08 Encounter for follow-up examination after completed treatment for malignant neoplasm: Secondary | ICD-10-CM | POA: Diagnosis not present

## 2018-09-27 DIAGNOSIS — H2512 Age-related nuclear cataract, left eye: Secondary | ICD-10-CM | POA: Diagnosis not present

## 2018-09-28 ENCOUNTER — Encounter: Payer: Self-pay | Admitting: *Deleted

## 2018-10-05 ENCOUNTER — Ambulatory Visit (INDEPENDENT_AMBULATORY_CARE_PROVIDER_SITE_OTHER): Payer: PPO | Admitting: Internal Medicine

## 2018-10-05 ENCOUNTER — Encounter: Payer: Self-pay | Admitting: Internal Medicine

## 2018-10-05 VITALS — BP 114/66 | HR 50 | Temp 97.6°F | Resp 14 | Ht 62.0 in | Wt 132.2 lb

## 2018-10-05 DIAGNOSIS — R7303 Prediabetes: Secondary | ICD-10-CM

## 2018-10-05 DIAGNOSIS — R21 Rash and other nonspecific skin eruption: Secondary | ICD-10-CM

## 2018-10-05 DIAGNOSIS — R001 Bradycardia, unspecified: Secondary | ICD-10-CM | POA: Diagnosis not present

## 2018-10-05 LAB — POCT GLYCOSYLATED HEMOGLOBIN (HGB A1C): HEMOGLOBIN A1C: 5.5 % (ref 4.0–5.6)

## 2018-10-05 LAB — COMPREHENSIVE METABOLIC PANEL
ALT: 20 U/L (ref 0–35)
AST: 23 U/L (ref 0–37)
Albumin: 4.4 g/dL (ref 3.5–5.2)
Alkaline Phosphatase: 55 U/L (ref 39–117)
BUN: 19 mg/dL (ref 6–23)
CO2: 28 meq/L (ref 19–32)
Calcium: 9.9 mg/dL (ref 8.4–10.5)
Chloride: 102 mEq/L (ref 96–112)
Creatinine, Ser: 0.84 mg/dL (ref 0.40–1.20)
GFR: 71.58 mL/min (ref >60.00)
Glucose, Bld: 86 mg/dL (ref 70–99)
Potassium: 4 mEq/L (ref 3.5–5.1)
SODIUM: 139 meq/L (ref 135–145)
Total Bilirubin: 0.7 mg/dL (ref 0.2–1.2)
Total Protein: 7.2 g/dL (ref 6.0–8.3)

## 2018-10-05 LAB — MAGNESIUM: Magnesium: 2.1 mg/dL (ref 1.5–2.5)

## 2018-10-05 LAB — TSH: TSH: 2.94 u[IU]/mL (ref 0.35–4.50)

## 2018-10-05 NOTE — Progress Notes (Signed)
Subjective:  Patient ID: Alexis James, female    DOB: 1951/03/19  Age: 67 y.o. MRN: [id number]  CC: The primary encounter diagnosis was Prediabetes. Diagnoses of Bradycardia, Localized macular rash, and Bradycardia, sinus were also pertinent to this visit.  HPI Alexis James presents for FOLLOW UP ON PREDIABETES AND HYPERLIPIDEMIA  Patient is exercising on a regular basis andtrying to lose weight.  Patient voices awareness  of the foods he/she needs to avoid,  And follows a low GI diet about 80% of the time.  She has not had an annual diabetic eye exam.  Denies numbness and tingling in lower extremities.  Denies hypoglycemic symptoms.   Recurrent rash under breasts. Derm recommended anti fungal otc   Has lost 8 lbs since last year.   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 acetaminophen (TYLENOL) 500 MG tablet Take 1,000 mg by mouth every 6 (six) hours as needed for moderate pain.    . Calcium Carb-Cholecalciferol (CALCIUM 600+D) 600-800 MG-UNIT TABS Take 1 tablet by mouth 2 (two) times daily.    . calcium carbonate (TUMS - DOSED IN MG ELEMENTAL CALCIUM) 500 MG chewable tablet Chew 2 tablets by mouth daily as needed for indigestion or heartburn.    . Coenzyme Q10 (COQ-10) 100 MG CAPS Take 100 mg by mouth daily.    Marland Kitchen doxylamine, Sleep, (UNISOM) 25 MG tablet Take 25 mg by mouth at bedtime as needed for sleep.    Marland Kitchen escitalopram (LEXAPRO) 5 MG tablet TAKE 1 TABLET BY MOUTH ONCE DAILY (Patient taking differently: Take 5 mg by mouth every evening. ) 90 tablet 1  . lovastatin (MEVACOR) 40 MG tablet TAKE 1 TABLET BY MOUTH AT BEDTIME (Patient taking differently: Take 40 mg by mouth every evening. ) 90 tablet 1  . aspirin EC 81 MG tablet Take 81 mg by mouth 2 (two) times a week. Tues and Fri    . fluticasone (FLONASE) 50  MCG/ACT nasal spray Place 1 spray into both nostrils daily as needed for allergies.     Marland Kitchen nystatin (NYSTATIN) powder Apply topically 2 (two) times daily. To affected area. (Patient not taking: Reported on 10/05/2018) 15 g 1  . Tolnaftate (ANTIFUNGAL SPRAY POWDER EX) Apply 1 application topically daily as needed (rash).    . triamcinolone cream (KENALOG) 0.1 % Apply 1 application topically 2 (two) times daily. To rash (Patient not taking: Reported on 10/05/2018) 45 g 0   No facility-administered medications prior to visit.     Review of Systems;  Patient denies headache, fevers, malaise, unintentional weight loss, skin rash, eye pain, sinus congestion and sinus pain, sore throat, dysphagia,  hemoptysis , cough, dyspnea, wheezing, chest pain, palpitations, orthopnea, edema, abdominal pain, nausea, melena, diarrhea, constipation, flank pain, dysuria, hematuria, urinary  Frequency, nocturia, numbness, tingling, seizures,  Focal weakness, Loss of consciousness,  Tremor, insomnia, depression, anxiety, and suicidal ideation.      Objective:  BP 114/66 (BP Location: Left Arm, Patient Position: Sitting, Cuff Size: Normal)   Pulse (!) 50   Temp 97.6 F (36.4 C) (Oral)   Resp 14   Ht 5\' 2"  (1.575 m)   Wt 132 lb 3.2 oz (60 kg)   SpO2 98%   BMI 24.18 kg/m   BP Readings from Last 3  Encounters:  10/05/18 114/66  05/01/18 120/74  04/05/18 104/60    Wt Readings from Last 3 Encounters:  10/05/18 132 lb 3.2 oz (60 kg)  05/01/18 136 lb 6.4 oz (61.9 kg)  04/05/18 137 lb 6.4 oz (62.3 kg)    General appearance: alert, cooperative and appears stated age Ears: normal TM's and external ear canals both ears Throat: lips, mucosa, and tongue normal; teeth and gums normal Neck: no adenopathy, no carotid bruit, supple, symmetrical, trachea midline and thyroid not enlarged, symmetric, no tenderness/mass/nodules Back: symmetric, no curvature. ROM normal. No CVA tenderness. Lungs: clear to auscultation  bilaterally Heart: regular rate and rhythm, S1, S2 normal, no murmur, click, rub or gallop Abdomen: soft, non-tender; bowel sounds normal; no masses,  no organomegaly Pulses: 2+ and symmetric Skin: Skin color, texture, turgor normal. No rashes or lesions Lymph nodes: Cervical, supraclavicular, and axillary nodes normal.  Lab Results  Component Value Date   HGBA1C 5.5 10/05/2018   HGBA1C 6.2 04/05/2018    Lab Results  Component Value Date   CREATININE 0.84 10/05/2018   CREATININE 0.79 04/05/2018   CREATININE 0.83 10/05/2017    Lab Results  Component Value Date   WBC 3.7 (L) 04/02/2016   HGB 12.3 04/02/2016   HCT 36.5 04/02/2016   PLT 362.0 04/02/2016   GLUCOSE 86 10/05/2018   CHOL 188 04/05/2018   TRIG 107.0 04/05/2018   HDL 69.70 04/05/2018   LDLDIRECT 94.3 03/26/2014   LDLCALC 97 04/05/2018   ALT 20 10/05/2018   AST 23 10/05/2018   NA 139 10/05/2018   K 4.0 10/05/2018   CL 102 10/05/2018   CREATININE 0.84 10/05/2018   BUN 19 10/05/2018   CO2 28 10/05/2018   TSH 2.94 10/05/2018   HGBA1C 5.5 10/05/2018    Mm 3d Screen Breast Bilateral  Result Date: 08/07/2018 CLINICAL DATA:  Screening. Prior BILATERAL reduction mammoplasty. EXAM: DIGITAL SCREENING BILATERAL MAMMOGRAM WITH TOMO AND CAD COMPARISON:  Previous exam(s). ACR Breast Density Category b: There are scattered areas of fibroglandular density. FINDINGS: There are no findings suspicious for malignancy. Images were processed with CAD. IMPRESSION: No mammographic evidence of malignancy. A result letter of this screening mammogram will be mailed directly to the patient. RECOMMENDATION: Screening mammogram in one year. (Code:SM-B-01Y) BI-RADS CATEGORY  2: Benign. Electronically Signed   By: Evangeline Dakin M.D.   On: 08/07/2018 15:54    Assessment & Plan:   Problem List Items Addressed This Visit    Bradycardia, sinus    EKG was done today to rule out  AV node dysfunction and was normal.   Screening labs normal    Lab Results  Component Value Date   TSH 2.94 10/05/2018   Lab Results  Component Value Date   NA 139 10/05/2018   K 4.0 10/05/2018   CL 102 10/05/2018   CO2 28 10/05/2018          Localized macular rash    Advised to resume twice daily use of  Nystatin powder under left breast.       Prediabetes - Primary    She has lowered her a1c into normal range with  a  low glycemic index diet and regular particpation in an aerobic  exercise activities.  Patient was advised to return for repeat an A1c in 6 months.   Lab Results  Component Value Date   HGBA1C 5.5 10/05/2018         Relevant Orders   POCT HgB A1C (Completed)  Other Visit Diagnoses    Bradycardia       Relevant Orders   TSH (Completed)   Comprehensive metabolic panel (Completed)   Magnesium (Completed)   EKG 12-Lead (Completed)     A total of 25 minutes of face to face time was spent with patient more than half of which was spent in counselling about the above mentioned conditions  and coordination of care  I am having Alexis Renshaw. James maintain her aspirin EC, fluticasone, CoQ-10, calcium carbonate, nystatin, triamcinolone cream, lovastatin, escitalopram, Calcium Carb-Cholecalciferol, doxylamine (Sleep), acetaminophen, and Tolnaftate (ANTIFUNGAL SPRAY POWDER EX).  No orders of the defined types were placed in this encounter.   There are no discontinued medications.  Follow-up: Return in about 6 months (around 04/06/2019).   Crecencio Mc, MD

## 2018-10-05 NOTE — Patient Instructions (Signed)
For your constipation :  60 ounces of water daily  Minimum   Colace (stool softener) 200 mg daily (available otc as docusate)   start the day with at least 8 ounces of water before your coffee  For your rash and ear problem  Resume the nystatin powder twice daily under breast and behind the earlobe     Congratulations!  You have resolved your "prediabetes"

## 2018-10-06 ENCOUNTER — Telehealth: Payer: Self-pay | Admitting: *Deleted

## 2018-10-06 NOTE — Telephone Encounter (Signed)
Copied from [place] [id number]. Topic: General - Other >> Oct 06, 2018  3:50 PM Keene Breath wrote: Reason for CRM: Patient called to request a copy of her EKG to be sent to Dr. Rockey Situ in order for her to be seen.  Patient stated that Dr. Derrel Nip said that she would send over those results.  Please advise.  CB# [phone]

## 2018-10-06 NOTE — Telephone Encounter (Signed)
Informed patient that Dr. Rockey Situ office par of [facility] and he can see the EKG>

## 2018-10-07 DIAGNOSIS — R001 Bradycardia, unspecified: Secondary | ICD-10-CM | POA: Insufficient documentation

## 2018-10-07 NOTE — Assessment & Plan Note (Addendum)
Advised to resume twice daily use of  Nystatin powder under left breast.

## 2018-10-07 NOTE — Assessment & Plan Note (Signed)
EKG was done today to rule out  AV node dysfunction and was normal.   Screening labs normal   Lab Results  Component Value Date   TSH 2.94 10/05/2018   Lab Results  Component Value Date   NA 139 10/05/2018   K 4.0 10/05/2018   CL 102 10/05/2018   CO2 28 10/05/2018

## 2018-10-07 NOTE — Assessment & Plan Note (Signed)
She has lowered her a1c into normal range with  a  low glycemic index diet and regular particpation in an aerobic  exercise activities.  Patient was advised to return for repeat an A1c in 6 months.   Lab Results  Component Value Date   HGBA1C 5.5 10/05/2018

## 2018-10-10 ENCOUNTER — Ambulatory Visit: Admission: RE | Admit: 2018-10-10 | Payer: PPO | Source: Ambulatory Visit | Admitting: Ophthalmology

## 2018-10-10 ENCOUNTER — Encounter: Admission: RE | Payer: Self-pay | Source: Ambulatory Visit

## 2018-10-10 HISTORY — DX: Malignant (primary) neoplasm, unspecified: C80.1

## 2018-10-10 HISTORY — DX: Type 2 diabetes mellitus without complications: E11.9

## 2018-10-10 SURGERY — PHACOEMULSIFICATION, CATARACT, WITH IOL INSERTION
Anesthesia: Choice | Laterality: Left

## 2018-10-11 ENCOUNTER — Telehealth: Payer: Self-pay | Admitting: Cardiovascular Disease

## 2018-10-11 NOTE — Progress Notes (Signed)
Cardiology Office Note  Date:  10/12/2018   ID:  Alexis James, DOB [birth year], MRN [id number]  PCP:  Alexis Mc, MD   Chief Complaint  Patient presents with  . other    Irregular heart beat. Meds reviewed verbally with pt.    HPI:  Alexis James already is a 67 year old woman with past medical history of Prediabetes Referred by Alexis James for consultation of her palpitations, irregular rhythm, bradycardia  Recently seen by Alexis James Abnormal heart rhythm appreciated on auscultation EKG with bradycardia, rate 58 Vitals from that visit with heart rate 50 She does report occasional palpitations, lots of stress, anxiety worried about things  Good exercise tolerance, no chest pain or shortness of breath  Lab work reviewed in detail Hemoglobin A1c 5.5 TSH 2.9  Scheduled for eye surgery.  Alexis James  EKG personally reviewed by myself on todays visit Shows normal sinus rhythm rate 86 bpm, PVCs noted   PMH:   has a past medical history of Anxiety, Cancer ([place]), Diabetes mellitus without complication ([place]), and Hyperlipidemia.  PSH:    Past Surgical History:  Procedure Laterality Date  . BREAST SURGERY     breast reduction  . CATARACT EXTRACTION W/PHACO Right 10/18/2017   Procedure: CATARACT EXTRACTION PHACO AND INTRAOCULAR LENS PLACEMENT (IOC);  Surgeon: Alexis Robson, MD;  Location: [facility] ORS;  Service: Ophthalmology;  Laterality: Right;  [place] 00:27 AP% 19.7 CDE5.32 fluid pack lot # [id number]  . CESAREAN SECTION    . REDUCTION MAMMAPLASTY    . smr      Current Outpatient Medications  Medication Sig Dispense Refill  . acetaminophen (TYLENOL) 500 MG tablet Take 1,000 mg by mouth every 6 (six) hours as needed for moderate pain.    Marland Kitchen aspirin EC 81 MG tablet Take 81 mg by mouth 2 (two) times a week. Tues and Fri    . Calcium Carb-Cholecalciferol (CALCIUM 600+D) 600-800 MG-UNIT TABS Take 1 tablet by mouth 2 (two) times daily.    . calcium carbonate (TUMS - DOSED IN MG  ELEMENTAL CALCIUM) 500 MG chewable tablet Chew 2 tablets by mouth daily as needed for indigestion or heartburn.    . Coenzyme Q10 (COQ-10) 100 MG CAPS Take 100 mg by mouth daily.    Marland Kitchen doxylamine, Sleep, (UNISOM) 25 MG tablet Take 25 mg by mouth at bedtime as needed for sleep.    Marland Kitchen escitalopram (LEXAPRO) 5 MG tablet TAKE 1 TABLET BY MOUTH ONCE DAILY (Patient taking differently: Take 5 mg by mouth every evening. ) 90 tablet 1  . fluticasone (FLONASE) 50 MCG/ACT nasal spray Place 1 spray into both nostrils daily as needed for allergies.     Marland Kitchen lovastatin (MEVACOR) 40 MG tablet TAKE 1 TABLET BY MOUTH AT BEDTIME (Patient taking differently: Take 40 mg by mouth every evening. ) 90 tablet 1  . nystatin (NYSTATIN) powder Apply topically 2 (two) times daily. To affected area. 15 g 1   No current facility-administered medications for this visit.      Allergies:   Patient has no known allergies.   Social History:  The patient  reports that she is a non-smoker but has been exposed to tobacco smoke. She has never used smokeless tobacco. She reports that she drinks alcohol. She reports that she does not use drugs.   Family History:   family history includes Colitis (age of onset: 76) in her mother; Heart Problems in her brother; Mental retardation (age of onset: 32) in her father; Transient ischemic  attack in her sister.    Review of Systems: Review of Systems  Constitutional: Negative.   Respiratory: Negative.   Cardiovascular: Positive for palpitations.  Gastrointestinal: Negative.   Musculoskeletal: Negative.   Neurological: Negative.   Psychiatric/Behavioral: The patient is nervous/anxious.   All other systems reviewed and are negative.   PHYSICAL EXAM: VS:  BP 117/64 (BP Location: Right Arm, Patient Position: Sitting, Cuff Size: Normal)   Pulse 86   Ht 5' 2.5" (1.588 m)   Wt 130 lb 8 oz (59.2 kg)   BMI 23.49 kg/m  , BMI Body mass index is 23.49 kg/m. GEN: Well nourished, well developed,  in no acute distress  HEENT: normal  Neck: no JVD, carotid bruits, or masses Cardiac: RRR; no murmurs, rubs, or gallops,no edema  Respiratory:  clear to auscultation bilaterally, normal work of breathing GI: soft, nontender, nondistended, + BS MS: no deformity or atrophy  Skin: warm and dry, no rash Neuro:  Strength and sensation are intact Psych: euthymic mood, full affect   Recent Labs: 10/05/2018: ALT 20; BUN 19; Creatinine, Ser 0.84; Magnesium 2.1; Potassium 4.0; Sodium 139; TSH 2.94    Lipid Panel Lab Results  Component Value Date   CHOL 188 04/05/2018   HDL 69.70 04/05/2018   LDLCALC 97 04/05/2018   TRIG 107.0 04/05/2018      Wt Readings from Last 3 Encounters:  10/12/18 130 lb 8 oz (59.2 kg)  10/05/18 132 lb 3.2 oz (60 kg)  05/01/18 136 lb 6.4 oz (61.9 kg)      ASSESSMENT AND PLAN:  PVC's (premature ventricular contractions) - Plan: ECHOCARDIOGRAM COMPLETE On EKG today with rare PVCs In general is not symptomatic though she does report having rare palpitation Seems to happen more when she has stress or anxiety Baseline bradycardia, would be difficult to treat with beta blockers or antiarrhythmics As she is asymptomatic she prefers no medications Echocardiogram ordered to rule out any structural heart disease  Bradycardia - Plan: EKG 12-Lead Heart rate in the 80s today, was in the 50s with primary care EKG showing rate 58 on their office visit Asymptomatic No indication for pacemaker given no orthostasis symptoms  Palpitations Secondary to rare PVCs above  Anxiety Lots of stress, husband who presents today reports she tends to worry about everything She does admit to significant worrying and stress Recommended walking program, Insomnia likely contributing to her symptoms above  Disposition:   F/U as needed   Total encounter time more than 45 minutes  Greater than 50% was spent in counseling and coordination of care with the patient    Orders  Placed This Encounter  Procedures  . EKG 12-Lead  . ECHOCARDIOGRAM COMPLETE     Signed, Alexis James, M.D., Ph.D. 10/12/2018  [place], [place]

## 2018-10-11 NOTE — Telephone Encounter (Signed)
° °  [facility] Pre-operative Risk Assessment    Request for surgical clearance:  1. What type of surgery is being performed? Eye surgery   2. When is this surgery scheduled?   3. What type of clearance is required (medical clearance vs. Pharmacy clearance to hold med vs. Both)?   4. Are there any medications that need to be held prior to surgery and how long?    5. Practice name and name of physician performing surgery? [facility] Dr Gwyndolyn Saxon Porfilio   6. What is your office phone number [phone]   7.   What is your office fax number [phone]  8.   Anesthesia type (None, local, MAC, general) ?

## 2018-10-12 ENCOUNTER — Encounter: Payer: Self-pay | Admitting: Cardiovascular Disease

## 2018-10-12 ENCOUNTER — Ambulatory Visit (INDEPENDENT_AMBULATORY_CARE_PROVIDER_SITE_OTHER): Payer: PPO | Admitting: Cardiovascular Disease

## 2018-10-12 VITALS — BP 117/64 | HR 86 | Ht 62.5 in | Wt 130.5 lb

## 2018-10-12 DIAGNOSIS — F419 Anxiety disorder, unspecified: Secondary | ICD-10-CM | POA: Diagnosis not present

## 2018-10-12 DIAGNOSIS — R001 Bradycardia, unspecified: Secondary | ICD-10-CM | POA: Diagnosis not present

## 2018-10-12 DIAGNOSIS — R002 Palpitations: Secondary | ICD-10-CM

## 2018-10-12 DIAGNOSIS — I493 Ventricular premature depolarization: Secondary | ICD-10-CM

## 2018-10-12 NOTE — Telephone Encounter (Signed)
Dr. Rockey Situ,   [place] for surgery?  Thanks!

## 2018-10-12 NOTE — Telephone Encounter (Signed)
Surgical Clearance form completed by Dr. Rockey Situ and faxed to [facility] @ [phone] Confirmation received.

## 2018-10-12 NOTE — Patient Instructions (Addendum)
You are having rare PVC Premature ventricle contractions  Medication Instructions:  No changes  If you need a refill on your cardiac medications before your next appointment, please call your pharmacy.    Lab work: No new labs needed   If you have labs (blood work) drawn today and your tests are completely normal, you will receive your results only by: Marland Kitchen MyChart Message (if you have MyChart) OR . A paper copy in the mail If you have any lab test that is abnormal or we need to change your treatment, we will call you to review the results.   Testing/Procedures: We will order an echocardiogram for arrhythmia, PVCs  Echocardiography is a painless test that uses sound waves to create images of your heart. It provides your doctor with information about the size and shape of your heart and how well your heart's chambers and valves are working. This procedure takes approximately one hour. There are no restrictions for this procedure. Please come hydrated to the test as we occasionally have to place an IV in some patients in order to inject an image enhancer for better quality pictures.  Follow-Up: At [facility], you and your health needs are our priority.  As part of our continuing mission to provide you with exceptional heart care, we have created designated Provider Care Teams.  These Care Teams include your primary Cardiologist (physician) and Advanced Practice Providers (APPs -  Physician Assistants and Nurse Practitioners) who all work together to provide you with the care you need, when you need it.  . You will need a follow up appointment as needed .   Please call our office 2 months in advance to schedule this appointment.    . Providers on your designated Care Team:   . Murray Hodgkins, NP . Christell Faith, PA-C . Marrianne Mood, PA-C  Any Other Special Instructions Will Be Listed Below (If Applicable).  For educational health videos Log in to : [email or web address] Or :  [email or web address], password : [email or web address]

## 2018-10-12 NOTE — Telephone Encounter (Signed)
Patient has an appointment today with Dr. Rockey Situ.

## 2018-10-26 ENCOUNTER — Other Ambulatory Visit: Payer: Self-pay

## 2018-10-26 ENCOUNTER — Ambulatory Visit (INDEPENDENT_AMBULATORY_CARE_PROVIDER_SITE_OTHER): Payer: PPO

## 2018-10-26 DIAGNOSIS — I493 Ventricular premature depolarization: Secondary | ICD-10-CM | POA: Diagnosis not present

## 2018-11-20 MED ORDER — PHENYLEPHRINE HCL 10 MG/ML IJ SOLN
INTRAMUSCULAR | Status: AC
  Filled 2018-11-20: qty 1

## 2018-11-20 MED ORDER — SUCCINYLCHOLINE CHLORIDE 20 MG/ML IJ SOLN
INTRAMUSCULAR | Status: AC
  Filled 2018-11-20: qty 1

## 2018-11-20 MED ORDER — PROPOFOL 10 MG/ML IV BOLUS
INTRAVENOUS | Status: AC
  Filled 2018-11-20: qty 40

## 2018-11-20 MED ORDER — EPHEDRINE SULFATE 50 MG/ML IJ SOLN
INTRAMUSCULAR | Status: AC
  Filled 2018-11-20: qty 1

## 2018-11-21 ENCOUNTER — Ambulatory Visit: Payer: PPO | Admitting: Anesthesiology

## 2018-11-21 ENCOUNTER — Other Ambulatory Visit: Payer: Self-pay

## 2018-11-21 ENCOUNTER — Encounter: Payer: Self-pay | Admitting: Anesthesiology

## 2018-11-21 ENCOUNTER — Ambulatory Visit
Admission: RE | Admit: 2018-11-21 | Discharge: 2018-11-21 | Disposition: A | Discharge status: Home / Self Care | Payer: PPO | Source: Ambulatory Visit | Attending: Ophthalmology | Admitting: Ophthalmology

## 2018-11-21 ENCOUNTER — Encounter
Admission: RE | Disposition: A | Discharge status: Home / Self Care | Payer: Self-pay | Source: Ambulatory Visit | Attending: Ophthalmology

## 2018-11-21 DIAGNOSIS — E1136 Type 2 diabetes mellitus with diabetic cataract: Secondary | ICD-10-CM | POA: Diagnosis not present

## 2018-11-21 DIAGNOSIS — E785 Hyperlipidemia, unspecified: Secondary | ICD-10-CM | POA: Insufficient documentation

## 2018-11-21 DIAGNOSIS — E7800 Pure hypercholesterolemia, unspecified: Secondary | ICD-10-CM | POA: Diagnosis not present

## 2018-11-21 DIAGNOSIS — H2512 Age-related nuclear cataract, left eye: Secondary | ICD-10-CM | POA: Insufficient documentation

## 2018-11-21 HISTORY — PX: CATARACT EXTRACTION W/PHACO: SHX586

## 2018-11-21 LAB — GLUCOSE, CAPILLARY: GLUCOSE-CAPILLARY: 87 mg/dL (ref 70–99)

## 2018-11-21 SURGERY — PHACOEMULSIFICATION, CATARACT, WITH IOL INSERTION
Anesthesia: Monitor Anesthesia Care | Site: Eye | Laterality: Left

## 2018-11-21 MED ORDER — POVIDONE-IODINE 5 % OP SOLN
OPHTHALMIC | Status: AC
  Filled 2018-11-21: qty 30

## 2018-11-21 MED ORDER — ARMC OPHTHALMIC DILATING DROPS
OPHTHALMIC | Status: AC
  Administered 2018-11-21: 1 via OPHTHALMIC
  Filled 2018-11-21: qty 0.5

## 2018-11-21 MED ORDER — ONDANSETRON HCL 4 MG/2ML IJ SOLN
INTRAMUSCULAR | Status: DC | PRN
  Administered 2018-11-21: 4 mg via INTRAVENOUS

## 2018-11-21 MED ORDER — EPINEPHRINE PF 1 MG/ML IJ SOLN
INTRAMUSCULAR | Status: AC
  Filled 2018-11-21: qty 2

## 2018-11-21 MED ORDER — NA CHONDROIT SULF-NA HYALURON 40-17 MG/ML IO SOLN
INTRAOCULAR | Status: AC
  Filled 2018-11-21: qty 1

## 2018-11-21 MED ORDER — LIDOCAINE HCL (PF) 4 % IJ SOLN
INTRAMUSCULAR | Status: AC
  Filled 2018-11-21: qty 5

## 2018-11-21 MED ORDER — MIDAZOLAM HCL 2 MG/2ML IJ SOLN
INTRAMUSCULAR | Status: DC | PRN
  Administered 2018-11-21: 1 mg via INTRAVENOUS

## 2018-11-21 MED ORDER — MOXIFLOXACIN HCL 0.5 % OP SOLN: 1.0000 [drp] | OPHTHALMIC | Status: DC | PRN

## 2018-11-21 MED ORDER — ARMC OPHTHALMIC DILATING DROPS
1.0000 "application " | OPHTHALMIC | Status: AC
  Administered 2018-11-21 (×3): 1 via OPHTHALMIC

## 2018-11-21 MED ORDER — MOXIFLOXACIN HCL 0.5 % OP SOLN
OPHTHALMIC | Status: DC | PRN
  Administered 2018-11-21: 0.2 mL via OPHTHALMIC

## 2018-11-21 MED ORDER — FENTANYL CITRATE (PF) 100 MCG/2ML IJ SOLN
INTRAMUSCULAR | Status: AC
  Filled 2018-11-21: qty 2

## 2018-11-21 MED ORDER — MIDAZOLAM HCL 2 MG/2ML IJ SOLN
INTRAMUSCULAR | Status: AC
  Filled 2018-11-21: qty 2

## 2018-11-21 MED ORDER — MOXIFLOXACIN HCL 0.5 % OP SOLN
OPHTHALMIC | Status: AC
  Filled 2018-11-21: qty 3

## 2018-11-21 MED ORDER — TETRACAINE HCL 0.5 % OP SOLN
1.0000 [drp] | OPHTHALMIC | Status: AC | PRN
  Administered 2018-11-21 (×3): 1 [drp] via OPHTHALMIC

## 2018-11-21 MED ORDER — FENTANYL CITRATE (PF) 100 MCG/2ML IJ SOLN: 25.0000 ug | INTRAMUSCULAR | Status: DC | PRN

## 2018-11-21 MED ORDER — ONDANSETRON HCL 4 MG/2ML IJ SOLN: 4.0000 mg | Freq: Once | INTRAMUSCULAR | Status: DC | PRN

## 2018-11-21 MED ORDER — EPINEPHRINE PF 1 MG/ML IJ SOLN
INTRAOCULAR | Status: DC | PRN
  Administered 2018-11-21: 09:00:00 via OPHTHALMIC

## 2018-11-21 MED ORDER — FENTANYL CITRATE (PF) 100 MCG/2ML IJ SOLN
INTRAMUSCULAR | Status: DC | PRN
  Administered 2018-11-21: 50 ug via INTRAVENOUS

## 2018-11-21 MED ORDER — LIDOCAINE HCL (PF) 4 % IJ SOLN
INTRAOCULAR | Status: DC | PRN
  Administered 2018-11-21: 4 mL via OPHTHALMIC

## 2018-11-21 MED ORDER — POVIDONE-IODINE 5 % OP SOLN
OPHTHALMIC | Status: DC | PRN
  Administered 2018-11-21: 1 via OPHTHALMIC

## 2018-11-21 MED ORDER — CARBACHOL 0.01 % IO SOLN
INTRAOCULAR | Status: DC | PRN
  Administered 2018-11-21: 0.5 mL via INTRAOCULAR

## 2018-11-21 MED ORDER — SODIUM CHLORIDE 0.9 % IV SOLN
INTRAVENOUS | Status: DC
  Administered 2018-11-21: 09:00:00 via INTRAVENOUS

## 2018-11-21 MED ORDER — TETRACAINE HCL 0.5 % OP SOLN
OPHTHALMIC | Status: AC
  Administered 2018-11-21: 1 [drp] via OPHTHALMIC
  Filled 2018-11-21: qty 4

## 2018-11-21 MED ORDER — NA CHONDROIT SULF-NA HYALURON 40-17 MG/ML IO SOLN
INTRAOCULAR | Status: DC | PRN
  Administered 2018-11-21: 1 mL via INTRAOCULAR

## 2018-11-21 SURGICAL SUPPLY — 16 items
GLOVE BIO SURGEON STRL SZ8 (GLOVE) ×2 IMPLANT
GLOVE BIOGEL M 6.5 STRL (GLOVE) ×2 IMPLANT
GLOVE SURG LX 8.0 MICRO (GLOVE) ×1
GLOVE SURG LX STRL 8.0 MICRO (GLOVE) ×1 IMPLANT
GOWN STRL REUS W/ TWL LRG LVL3 (GOWN DISPOSABLE) ×2 IMPLANT
GOWN STRL REUS W/TWL LRG LVL3 (GOWN DISPOSABLE) ×4
LABEL CATARACT MEDS ST (LABEL) ×2 IMPLANT
LENS IOL TECNIS ITEC 18.5 (Intraocular Lens) ×1 IMPLANT
PACK CATARACT (MISCELLANEOUS) ×2 IMPLANT
PACK CATARACT BRASINGTON LX (MISCELLANEOUS) ×2 IMPLANT
PACK EYE AFTER SURG (MISCELLANEOUS) ×2 IMPLANT
SOL BSS BAG (MISCELLANEOUS) ×2
SOLUTION BSS BAG (MISCELLANEOUS) ×1 IMPLANT
SYR 5ML LL (SYRINGE) ×2 IMPLANT
WATER STERILE IRR 250ML POUR (IV SOLUTION) ×2 IMPLANT
WIPE NON LINTING 3.25X3.25 (MISCELLANEOUS) ×2 IMPLANT

## 2018-11-21 NOTE — Discharge Instructions (Addendum)
Eye Surgery Discharge Instructions  Expect mild scratchy sensation or mild soreness. DO NOT RUB YOUR EYE!  The day of surgery:  Minimal physical activity, but bed rest is not required  No reading, computer work, or close hand work  No bending, lifting, or straining.  May watch TV  For 24 hours:  No driving, legal decisions, or alcoholic beverages  Safety precautions  Eat anything you prefer: It is better to start with liquids, then soup then solid foods.  Solar shield eyeglasses should be worn for comfort in the sunlight/patch while sleeping  Resume all regular medications including aspirin or Coumadin if these were discontinued prior to surgery. You may shower, bathe, shave, or wash your hair. Tylenol may be taken for mild discomfort. FOLLOW DR. PORFILIO'S EYE DROP INSTRUCTION SHEET AS REVIEWED.  Call your doctor if you experience significant pain, nausea, or vomiting, fever > 101 or other signs of infection. [phone] or [phone] Specific instructions:  Follow-up Information    Birder Robson, MD Follow up.   Specialty:  Ophthalmology Why:  11/22/18 @ 8:10 am  Contact information: [address] [place] [address] [phone]

## 2018-11-21 NOTE — Anesthesia Preprocedure Evaluation (Signed)
Anesthesia Evaluation  Patient identified by MRN, date of birth, ID band Patient awake    Reviewed: Allergy & Precautions, NPO status , Patient's Chart, lab work & pertinent test results  History of Anesthesia Complications Negative for: history of anesthetic complications  Airway Mallampati: II  TM Distance: >3 FB Neck ROM: Full    Dental no notable dental hx.    Pulmonary neg pulmonary ROS, neg sleep apnea, neg COPD,    breath sounds clear to auscultation- rhonchi (-) wheezing      Cardiovascular Exercise Tolerance: Good (-) hypertension(-) CAD and (-) Past MI negative cardio ROS   Rhythm:Regular Rate:Normal - Systolic murmurs and - Diastolic murmurs    Neuro/Psych Anxiety negative neurological ROS     GI/Hepatic negative GI ROS, Neg liver ROS,   Endo/Other  negative endocrine ROSdiabetes  Renal/GU negative Renal ROS     Musculoskeletal negative musculoskeletal ROS (+)   Abdominal (+) - obese,   Peds  Hematology negative hematology ROS (+)   Anesthesia Other Findings Past Medical History: No date: Anxiety No date: Hyperlipidemia   Reproductive/Obstetrics                             Anesthesia Physical  Anesthesia Plan  ASA: II  Anesthesia Plan: MAC   Post-op Pain Management:    Induction: Intravenous  PONV Risk Score and Plan: 2 and Midazolam  Airway Management Planned: Natural Airway and Nasal Cannula  Additional Equipment:   Intra-op Plan:   Post-operative Plan:   Informed Consent: I have reviewed the patients History and Physical, chart, labs and discussed the procedure including the risks, benefits and alternatives for the proposed anesthesia with the patient or authorized representative who has indicated his/her understanding and acceptance.     Plan Discussed with: CRNA and Anesthesiologist  Anesthesia Plan Comments:         Anesthesia Quick  Evaluation

## 2018-11-21 NOTE — Op Note (Signed)
PREOPERATIVE DIAGNOSIS:  Nuclear sclerotic cataract of the left eye.   POSTOPERATIVE DIAGNOSIS:  Nuclear sclerotic cataract of the left eye.   OPERATIVE PROCEDURE: Procedure(s): CATARACT EXTRACTION PHACO AND INTRAOCULAR LENS PLACEMENT (IOC)   SURGEON:  Birder Robson, MD.   ANESTHESIA:  Anesthesiologist: Alvin Critchley, MD CRNA: Disser, Einar Grad, CRNA  1.      Managed anesthesia care. 2.     0.29ml of Shugarcaine was instilled following the paracentesis   COMPLICATIONS:  None.   TECHNIQUE:   Stop and chop   DESCRIPTION OF PROCEDURE:  The patient was examined and consented in the preoperative holding area where the aforementioned topical anesthesia was applied to the left eye and then brought back to the Operating Room where the left eye was prepped and draped in the usual sterile ophthalmic fashion and a lid speculum was placed. A paracentesis was created with the side port blade and the anterior chamber was filled with viscoelastic. A near clear corneal incision was performed with the steel keratome. A continuous curvilinear capsulorrhexis was performed with a cystotome followed by the capsulorrhexis forceps. Hydrodissection and hydrodelineation were carried out with BSS on a blunt cannula. The lens was removed in a stop and chop  technique and the remaining cortical material was removed with the irrigation-aspiration handpiece. The capsular bag was inflated with viscoelastic and the Technis [id number] lens was placed in the capsular bag without complication. The remaining viscoelastic was removed from the eye with the irrigation-aspiration handpiece. The wounds were hydrated. The anterior chamber was flushed with Miostat and the eye was inflated to physiologic pressure. 0.92ml Vigamox was placed in the anterior chamber. The wounds were found to be water tight. The eye was dressed with Vigamox. The patient was given protective glasses to wear throughout the day and a shield with which to sleep  tonight. The patient was also given drops with which to begin a drop regimen today and will follow-up with me in one day. Implant Name Type Inv. Item Serial No. Manufacturer Lot No. LRB No. Used  LENS IOL DIOP 18.5 - [id number] Intraocular Lens LENS IOL DIOP 18.5 [id number] AMO  Left 1    Procedure(s) with comments: CATARACT EXTRACTION PHACO AND INTRAOCULAR LENS PLACEMENT (IOC) (Left) - [place] 00:31.3 CDE 5.38 Fluid Pack Lot # [id number]  Electronically signed: Birder Robson 11/21/2018 9:28 AM

## 2018-11-21 NOTE — H&P (Signed)
All labs reviewed. Abnormal studies sent to patients PCP when indicated.  Previous H&P reviewed, patient examined, there are NO CHANGES.  Alexis Prout Porfilio12/10/20199:05 AM

## 2018-11-21 NOTE — Transfer of Care (Signed)
Immediate Anesthesia Transfer of Care Note  Patient: Alexis James  Procedure(s) Performed: CATARACT EXTRACTION PHACO AND INTRAOCULAR LENS PLACEMENT (IOC) (Left Eye)  Patient Location: PACU  Anesthesia Type:MAC  Level of Consciousness: awake, alert  and oriented  Airway & Oxygen Therapy: Patient Spontanous Breathing  Post-op Assessment: Report given to RN and Post -op Vital signs reviewed and stable  Post vital signs: Reviewed and stable  Last Vitals:  Vitals Value Taken Time  BP    Temp    Pulse    Resp    SpO2      Last Pain:  Vitals:   11/21/18 0818  TempSrc: Temporal         Complications: No apparent anesthesia complications

## 2018-11-21 NOTE — Anesthesia Postprocedure Evaluation (Signed)
Anesthesia Post Note  Patient: Alexis James  Procedure(s) Performed: CATARACT EXTRACTION PHACO AND INTRAOCULAR LENS PLACEMENT (IOC) (Left Eye)  Patient location during evaluation: PACU Anesthesia Type: MAC Level of consciousness: awake and alert Pain management: pain level controlled Vital Signs Assessment: post-procedure vital signs reviewed and stable Respiratory status: spontaneous breathing, nonlabored ventilation, respiratory function stable and patient connected to nasal cannula oxygen Cardiovascular status: stable and blood pressure returned to baseline Postop Assessment: no apparent nausea or vomiting Anesthetic complications: no     Last Vitals:  Vitals:   11/21/18 0818  BP: (!) 121/54  Pulse: 77  Resp: 16  Temp: (!) 36.2 C  SpO2: 100%    Last Pain:  Vitals:   11/21/18 0818  TempSrc: Temporal                 Einar Grad [name]

## 2018-11-21 NOTE — Anesthesia Post-op Follow-up Note (Signed)
Anesthesia QCDR form completed.        

## 2018-11-22 ENCOUNTER — Encounter: Payer: Self-pay | Admitting: Ophthalmology

## 2018-12-18 ENCOUNTER — Encounter

## 2018-12-18 ENCOUNTER — Ambulatory Visit: Payer: PPO | Admitting: Cardiovascular Disease

## 2018-12-21 ENCOUNTER — Other Ambulatory Visit: Payer: Self-pay | Admitting: Internal Medicine

## 2019-02-07 DIAGNOSIS — Z961 Presence of intraocular lens: Secondary | ICD-10-CM | POA: Diagnosis not present

## 2019-02-07 DIAGNOSIS — H10232 Serous conjunctivitis, except viral, left eye: Secondary | ICD-10-CM | POA: Diagnosis not present

## 2019-02-07 DIAGNOSIS — L209 Atopic dermatitis, unspecified: Secondary | ICD-10-CM | POA: Diagnosis not present

## 2019-02-27 ENCOUNTER — Other Ambulatory Visit: Payer: Self-pay | Admitting: Internal Medicine

## 2019-04-06 ENCOUNTER — Ambulatory Visit: Payer: PPO | Admitting: Internal Medicine

## 2019-04-09 ENCOUNTER — Encounter: Payer: PPO | Admitting: Internal Medicine

## 2019-04-10 ENCOUNTER — Encounter: Payer: PPO | Admitting: Internal Medicine

## 2019-04-13 DIAGNOSIS — L905 Scar conditions and fibrosis of skin: Secondary | ICD-10-CM | POA: Diagnosis not present

## 2019-04-13 DIAGNOSIS — L814 Other melanin hyperpigmentation: Secondary | ICD-10-CM | POA: Diagnosis not present

## 2019-04-13 DIAGNOSIS — L820 Inflamed seborrheic keratosis: Secondary | ICD-10-CM | POA: Diagnosis not present

## 2019-05-02 ENCOUNTER — Ambulatory Visit (INDEPENDENT_AMBULATORY_CARE_PROVIDER_SITE_OTHER): Payer: PPO | Admitting: Internal Medicine

## 2019-05-02 ENCOUNTER — Other Ambulatory Visit (INDEPENDENT_AMBULATORY_CARE_PROVIDER_SITE_OTHER): Payer: PPO

## 2019-05-02 ENCOUNTER — Other Ambulatory Visit: Payer: Self-pay

## 2019-05-02 ENCOUNTER — Telehealth: Payer: Self-pay | Admitting: Internal Medicine

## 2019-05-02 ENCOUNTER — Encounter: Payer: Self-pay | Admitting: Internal Medicine

## 2019-05-02 ENCOUNTER — Ambulatory Visit (INDEPENDENT_AMBULATORY_CARE_PROVIDER_SITE_OTHER): Payer: PPO

## 2019-05-02 DIAGNOSIS — R55 Syncope and collapse: Secondary | ICD-10-CM | POA: Insufficient documentation

## 2019-05-02 DIAGNOSIS — I493 Ventricular premature depolarization: Secondary | ICD-10-CM

## 2019-05-02 DIAGNOSIS — R42 Dizziness and giddiness: Secondary | ICD-10-CM

## 2019-05-02 DIAGNOSIS — R7303 Prediabetes: Secondary | ICD-10-CM

## 2019-05-02 HISTORY — DX: Syncope and collapse: R55

## 2019-05-02 LAB — TROPONIN I: Troponin I: <0.01 ng/mL (ref 0.00–0.04)

## 2019-05-02 NOTE — Progress Notes (Signed)
Virtual Visit via [email or web address]  This visit type was conducted due to national recommendations for restrictions regarding the COVID-19 pandemic (e.g. social distancing).  This format is felt to be most appropriate for this patient at this time.  All issues noted in this document were discussed and addressed.  No physical exam was performed (except for noted visual exam findings with Video Visits).   I connected with@ on 05/02/19 at  8:30 AM EDT by a video enabled telemedicine application or telephone and verified that I am speaking with the correct person using two identifiers. Location patient: home Location provider: work or home office Persons participating in the virtual visit: patient, provider  I discussed the limitations, risks, security and privacy concerns of performing an evaluation and management service by telephone and the availability of in person appointments. I also discussed with the patient that there may be a patient responsible charge related to this service. The patient expressed understanding and agreed to proceed.   Reason for visit: presyncopal episode resulting in a fall   HPI:  History of bradycardia and PVC's with normal ECHO during cardiology eval in Oct.  Had a syncopal episode after voiding Sunday night occurred while at the beach.  Had walked 7 miles on the beach earlier that day felt fine when she went to bed.  Got up to void,  Occurred while sitting on commode,  Went back to bed ,  Then fell  and struck face on chest of drawers . Estimates that the  LOC less than 5 sec.  2nd episode was  milder on tuesday after walking 30 minutes.  Flushed light headed ,  Better after resting and eating. Marland Kitchen  No chest pain .   No history of HTN , does not own a BP cuff  Diet is low glycemic since prediabetes diagnosed last year.    ROS: See pertinent positives and negatives per HPI.  Past Medical History:  Diagnosis Date  . Anxiety   . Cancer ([place])    skin  . Diabetes mellitus  without complication ([place])   . Hyperlipidemia     Past Surgical History:  Procedure Laterality Date  . BREAST SURGERY     breast reduction  . CATARACT EXTRACTION W/PHACO Right 10/18/2017   Procedure: CATARACT EXTRACTION PHACO AND INTRAOCULAR LENS PLACEMENT (IOC);  Surgeon: Birder Robson, MD;  Location: [facility] ORS;  Service: Ophthalmology;  Laterality: Right;  [place] 00:27 AP% 19.7 CDE5.32 fluid pack lot # [id number]  . CATARACT EXTRACTION W/PHACO Left 11/21/2018   Procedure: CATARACT EXTRACTION PHACO AND INTRAOCULAR LENS PLACEMENT (IOC);  Surgeon: Birder Robson, MD;  Location: [facility] ORS;  Service: Ophthalmology;  Laterality: Left;  [place] 00:31.3 CDE 5.38 Fluid Pack Lot # [id number]  . CESAREAN SECTION    . REDUCTION MAMMAPLASTY    . smr      Family History  Problem Relation Age of Onset  . Colitis Mother 74  . Mental retardation Father 55  . Heart Problems Brother   . Transient ischemic attack Sister   . Cancer Neg Hx     SOCIAL HX:  Social History   Tobacco Use  . Smoking status: Passive Smoke Exposure - Never Smoker  . Smokeless tobacco: Never Used  . Tobacco comment: Husband smoked x 38 years (quit 4 years ago)  Substance Use Topics  . Alcohol use: Yes    Comment: Occasional  . Drug use: No     Current Outpatient Medications:  .  aspirin EC 81 MG tablet, Take  81 mg by mouth 2 (two) times a week. Tues and Thurs, Disp: , Rfl:  .  Calcium Carb-Cholecalciferol (CALCIUM 600+D) 600-800 MG-UNIT TABS, Take 1 tablet by mouth 2 (two) times daily., Disp: , Rfl:  .  calcium carbonate (TUMS - DOSED IN MG ELEMENTAL CALCIUM) 500 MG chewable tablet, Chew 2 tablets by mouth daily as needed for indigestion or heartburn., Disp: , Rfl:  .  Coenzyme Q10 (COQ-10) 100 MG CAPS, Take 100 mg by mouth daily., Disp: , Rfl:  .  docusate sodium (COLACE) 100 MG capsule, Take 100 mg by mouth daily as needed for mild constipation., Disp: , Rfl:  .  doxylamine, Sleep, (UNISOM) 25 MG tablet, Take 25 mg  by mouth at bedtime as needed for sleep., Disp: , Rfl:  .  escitalopram (LEXAPRO) 5 MG tablet, Take 1 tablet by mouth once daily, Disp: 90 tablet, Rfl: 0 .  lovastatin (MEVACOR) 40 MG tablet, TAKE 1 TABLET BY MOUTH AT BEDTIME, Disp: 90 tablet, Rfl: 1 .  acetaminophen (TYLENOL) 500 MG tablet, Take 1,000 mg by mouth every 6 (six) hours as needed for moderate pain., Disp: , Rfl:   EXAM:  VITALS per patient if applicable:  GENERAL: alert, oriented, appears well and in no acute distress  HEENT: atraumatic, conjunttiva clear, no obvious abnormalities on inspection of external nose and ears  NECK: normal movements of the head and neck  LUNGS: on inspection no signs of respiratory distress, breathing rate appears normal, no obvious gross SOB, gasping or wheezing  CV: no obvious cyanosis  MS: moves all visible extremities without noticeable abnormality  PSYCH/NEURO: pleasant and cooperative, no obvious depression or anxiety, speech and thought processing grossly intact  ASSESSMENT AND PLAN:  Discussed the following assessment and plan:  Postural dizziness with presyncope  PVC's (premature ventricular contractions) - Plan: Troponin I -, CK (Creatine Kinase), Magnesium, TSH  Prediabetes - Plan: Hemoglobin A1c, Comprehensive metabolic panel  Syncope and collapse  Syncope and collapse First episode may have been post micturition,  But second episode in less than 72 hours worrisome for arrhythmia given history of bradycardia and PVCs.  Stat labs, orthostatic vital signs to be done today,  And advised to See cardiology again.  (Dr Rockey Situ)     I discussed the assessment and treatment plan with the patient. The patient was provided an opportunity to ask questions and all were answered. The patient agreed with the plan and demonstrated an understanding of the instructions.   The patient was advised to call back or seek an in-person evaluation if the symptoms worsen or if the condition fails to  improve as anticipated.  I provided 25 minutes of non-face-to-face time during this encounter.   Crecencio Mc, MD

## 2019-05-02 NOTE — Progress Notes (Signed)
Pt stated that on Sunday in the early morning sometime she got out of bed felt like she was going to pass out, sat down on the bed and then ended up in the kitchen where she fell and hit her face.

## 2019-05-02 NOTE — Addendum Note (Signed)
Addended by: Leeanne Rio on: 05/02/2019 02:31 PM   Modules accepted: Orders

## 2019-05-02 NOTE — Telephone Encounter (Signed)
Thayer Headings from [facility] called with a stat troponin level done at 1636 with a value of  <0.01. Attempted to call on call provider with results. No answer will try to call back. Left message on voicemail regarding this patient's lab. And advised to call back [facility] for any questions. Routing to LB at [employer].

## 2019-05-02 NOTE — Progress Notes (Signed)
Pt has been scheduled for both nurse visit and lab appt for this afternoon at 2:30pm.

## 2019-05-02 NOTE — Assessment & Plan Note (Signed)
First episode may have been post micturition,  But second episode in less than 72 hours worrisome for arrhythmia given history of bradycardia and PVCs.  Stat labs, orthostatic vital signs to be done today,  And advised to See cardiology again.  (Dr Rockey Situ)

## 2019-05-02 NOTE — Progress Notes (Addendum)
Patient presented today for orthostatic vital signs per PCP's ov notes on 05/02/19.    POSITION BP PR O2  Lying (Supine) 124/70 62 93  Sitting 120/68 68 94  Standing 128/78 81 95  Standing ( 3 mins) 130/70 75 96    She has no orthostasis

## 2019-05-02 NOTE — Telephone Encounter (Signed)
On call provider, Dr. Jenny Reichmann returned call and stat lab message of troponin given to him with verbal understanding. No orders.

## 2019-05-03 ENCOUNTER — Telehealth: Payer: Self-pay | Admitting: *Deleted

## 2019-05-03 LAB — COMPREHENSIVE METABOLIC PANEL
ALT: 20 IU/L (ref 0–32)
AST: 25 IU/L (ref 0–40)
Albumin/Globulin Ratio: 2.3 — ABNORMAL HIGH (ref 1.2–2.2)
Albumin: 4.2 g/dL (ref 3.8–4.8)
Alkaline Phosphatase: 60 IU/L (ref 39–117)
BUN/Creatinine Ratio: 19 (ref 12–28)
BUN: 17 mg/dL (ref 8–27)
Bilirubin Total: <0.2 mg/dL (ref 0.0–1.2)
CO2: 28 mmol/L (ref 20–29)
Calcium: 9.2 mg/dL (ref 8.7–10.3)
Chloride: 104 mmol/L (ref 96–106)
Creatinine, Ser: 0.89 mg/dL (ref 0.57–1.00)
GFR calc Af Amer: 77 mL/min/{1.73_m2} (ref >59)
GFR calc non Af Amer: 67 mL/min/{1.73_m2} (ref >59)
Globulin, Total: 1.8 g/dL (ref 1.5–4.5)
Glucose: 84 mg/dL (ref 65–99)
Potassium: 4 mmol/L (ref 3.5–5.2)
Sodium: 142 mmol/L (ref 134–144)
Total Protein: 6 g/dL (ref 6.0–8.5)

## 2019-05-03 LAB — HEMOGLOBIN A1C
Est. average glucose Bld gHb Est-mCnc: 123 mg/dL
Hgb A1c MFr Bld: 5.9 % — ABNORMAL HIGH (ref 4.8–5.6)

## 2019-05-03 LAB — CK: Total CK: 76 U/L (ref 32–182)

## 2019-05-03 LAB — TSH: TSH: 3.03 u[IU]/mL (ref 0.450–4.500)

## 2019-05-03 LAB — MAGNESIUM: Magnesium: 2.2 mg/dL (ref 1.6–2.3)

## 2019-05-03 NOTE — Telephone Encounter (Signed)
Left voicemail message for patient to call back. Per Dr. Rockey Situ we will send her a monitor and once we get those results we can schedule her a visit to review. I will try to call again here later.

## 2019-05-03 NOTE — Telephone Encounter (Signed)
-----   Message from Minna Merritts, MD sent at 05/02/2019 10:18 PM EDT ----- Regarding: Friday Can we see if she will agree to an evisit on Friday late morning, say 11 :3 Dr. Derrel Nip worried about pass out spells May need to discuss a monitor to look for arrhythmia Thx TG

## 2019-05-03 NOTE — Telephone Encounter (Signed)
Spoke with patient and reviewed that Dr. Rockey Situ wanted to order a 2 week heart monitor. Reviewed that this monitor will be mailed to her home with all instructions about how to place it on and box to return it. Once they receive the monitor they will download and mail Alexis James the results and we can schedule appointment to review results with Dr. Rockey Situ. She was agreeable to this and had no further questions at this time. Advised that I would send her a mychart message with some additional information and to please call Alexis James if any questions. She verbalized understanding of our conversation, agreement with plan, and had no further questions at this time.

## 2019-05-11 ENCOUNTER — Ambulatory Visit (INDEPENDENT_AMBULATORY_CARE_PROVIDER_SITE_OTHER): Payer: PPO

## 2019-05-11 DIAGNOSIS — R002 Palpitations: Secondary | ICD-10-CM

## 2019-05-14 ENCOUNTER — Telehealth: Payer: Self-pay | Admitting: *Deleted

## 2019-05-14 NOTE — Telephone Encounter (Signed)
Left voicemail message for patient to call back regarding monitor and to see if she has any questions.

## 2019-06-01 DIAGNOSIS — R55 Syncope and collapse: Secondary | ICD-10-CM | POA: Diagnosis not present

## 2019-06-05 ENCOUNTER — Other Ambulatory Visit: Payer: Self-pay

## 2019-06-05 ENCOUNTER — Other Ambulatory Visit: Payer: Self-pay | Admitting: Internal Medicine

## 2019-06-06 ENCOUNTER — Ambulatory Visit (INDEPENDENT_AMBULATORY_CARE_PROVIDER_SITE_OTHER): Payer: PPO | Admitting: Internal Medicine

## 2019-06-06 ENCOUNTER — Encounter: Payer: Self-pay | Admitting: Internal Medicine

## 2019-06-06 ENCOUNTER — Other Ambulatory Visit: Payer: Self-pay | Admitting: *Deleted

## 2019-06-06 ENCOUNTER — Other Ambulatory Visit: Payer: Self-pay

## 2019-06-06 DIAGNOSIS — Z0000 Encounter for general adult medical examination without abnormal findings: Secondary | ICD-10-CM

## 2019-06-06 DIAGNOSIS — R002 Palpitations: Secondary | ICD-10-CM | POA: Diagnosis not present

## 2019-06-06 DIAGNOSIS — Z0001 Encounter for general adult medical examination with abnormal findings: Secondary | ICD-10-CM

## 2019-06-06 DIAGNOSIS — R55 Syncope and collapse: Secondary | ICD-10-CM | POA: Diagnosis not present

## 2019-06-06 NOTE — Progress Notes (Signed)
Patient ID: Alexis James, female    DOB: Jun 04, 1951  Age: 68 y.o. MRN: [id number]  The patient is here for annual preventive  examination and management of other chronic and acute problems.  History of basal cell carcinoma resected from tip of nose.    The risk factors are reflected in the social history.  The roster of all physicians providing medical care to patient - is listed in the Snapshot section of the chart.  Activities of daily living:  The patient is 100% independent in all ADLs: dressing, toileting, feeding as well as independent mobility  Home safety : The patient has smoke detectors in the home. They wear seatbelts.  There are no firearms at home. There is no violence in the home.   There is no risks for hepatitis, STDs or HIV. There is no   history of blood transfusion. They have no travel history to infectious disease endemic areas of the world.  The patient has seen their dentist in the last six month. They have seen their eye doctor in the last year. They admit to slight hearing difficulty with regard to whispered voices and some television programs.  They have deferred audiologic testing in the last year.  They do not  have excessive sun exposure. Discussed the need for sun protection: hats, long sleeves and use of sunscreen if there is significant sun exposure.   Diet: the importance of a healthy diet is discussed. They do have a healthy diet.  The benefits of regular aerobic exercise were discussed. She walks 4 times per week ,  20 minutes.   Depression screen: there are no signs or vegative symptoms of depression- irritability, change in appetite, anhedonia, sadness/tearfullness.  Cognitive assessment: the patient manages all their financial and personal affairs and is actively engaged. They could relate day,date,year and events; recalled 2/3 objects at 3 minutes; performed clock-face test normally.  The following portions of the patient's history were reviewed and  updated as appropriate: allergies, current medications, past family history, past medical history,  past surgical history, past social history  and problem list.  Visual acuity was not assessed per patient preference since she has regular follow up with her ophthalmologist. Hearing and body mass index were assessed and reviewed.   During the course of the visit the patient was educated and counseled about appropriate screening and preventive services including : fall prevention , diabetes screening, nutrition counseling, colorectal cancer screening, and recommended immunizations.    CC: Diagnoses of Encounter for preventive health examination, Palpitations, and Syncope and collapse were pertinent to this visit.  Follow up recent episode of recurrent syncope /presyncope that occurred in May. Zio Monitor was placed May 29 and worn for 14 days, ending Jun 12 No significant arrhythmias were noted except for one asymptomatic 4 beat run of VT .  She had 5 episodes  of SVT lasting 6 beats or less and one episode of diaphoresis and nausea occurred while driving that had no  corresponding arrhythmia.  She has not checked blood sugar during episodes;  She does not own a glucometer   History Alexis James has a past medical history of Anxiety, Cancer ([place]), Diabetes mellitus without complication ([place]), and Hyperlipidemia.   She has a past surgical history that includes Cesarean section; Breast surgery; Reduction mammaplasty; Cataract extraction w/PHACO (Right, 10/18/2017); smr; and Cataract extraction w/PHACO (Left, 11/21/2018).   Her family history includes Colitis (age of onset: 6) in her mother; Heart Problems in her brother; Mental retardation (age  of onset: 38) in her father; Transient ischemic attack in her sister.She reports that she is a non-smoker but has been exposed to tobacco smoke. She has never used smokeless tobacco. She reports current alcohol use. She reports that she does not use drugs.  Outpatient  Medications Prior to Visit  Medication Sig Dispense Refill  . acetaminophen (TYLENOL) 500 MG tablet Take 1,000 mg by mouth every 6 (six) hours as needed for moderate pain.    Marland Kitchen aspirin EC 81 MG tablet Take 81 mg by mouth 2 (two) times a week. Tues and Thurs    . Calcium Carb-Cholecalciferol (CALCIUM 600+D) 600-800 MG-UNIT TABS Take 1 tablet by mouth 2 (two) times daily.    . calcium carbonate (TUMS - DOSED IN MG ELEMENTAL CALCIUM) 500 MG chewable tablet Chew 2 tablets by mouth daily as needed for indigestion or heartburn.    . Coenzyme Q10 (COQ-10) 100 MG CAPS Take 100 mg by mouth daily.    Marland Kitchen docusate sodium (COLACE) 100 MG capsule Take 100 mg by mouth daily as needed for mild constipation.    Marland Kitchen doxylamine, Sleep, (UNISOM) 25 MG tablet Take 25 mg by mouth at bedtime as needed for sleep.    Marland Kitchen escitalopram (LEXAPRO) 5 MG tablet Take 1 tablet by mouth once daily 90 tablet 0  . lovastatin (MEVACOR) 40 MG tablet TAKE 1 TABLET BY MOUTH AT BEDTIME 90 tablet 1   No facility-administered medications prior to visit.     Review of Systems   Patient denies headache, fevers, malaise, unintentional weight loss, skin rash, eye pain, sinus congestion and sinus pain, sore throat, dysphagia,  hemoptysis , cough, dyspnea, wheezing, chest pain, palpitations, orthopnea, edema, abdominal pain, nausea, melena, diarrhea, constipation, flank pain, dysuria, hematuria, urinary  Frequency, nocturia, numbness, tingling, seizures,  Focal weakness, Loss of consciousness,  Tremor, insomnia, depression, anxiety, and suicidal ideation.     Objective:  BP 114/68 (BP Location: Left Arm, Patient Position: Sitting, Cuff Size: Normal)   Pulse 79   Temp 98.1 F (36.7 C) (Oral)   Resp 15   Ht 5' 5.5" (1.664 m)   Wt 133 lb 3.2 oz (60.4 kg)   SpO2 95%   BMI 21.83 kg/m   Physical Exam   General appearance: alert, cooperative and appears stated age Head: Normocephalic, without obvious abnormality, atraumatic Eyes:  conjunctivae/corneas clear. PERRL, EOM's intact. Fundi benign. Ears: normal TM's and external ear canals both ears Nose: Nares normal. Septum midline. Mucosa normal. No drainage or sinus tenderness. Throat: lips, mucosa, and tongue normal; teeth and gums normal Neck: no adenopathy, no carotid bruit, no JVD, supple, symmetrical, trachea midline and thyroid not enlarged, symmetric, no tenderness/mass/nodules Lungs: clear to auscultation bilaterally Breasts: normal appearance, no masses or tenderness Heart: regular rate and rhythm, S1, S2 normal, no murmur, click, rub or gallop Abdomen: soft, non-tender; bowel sounds normal; no masses,  no organomegaly Extremities: extremities normal, atraumatic, no cyanosis or edema Pulses: 2+ and symmetric Skin: Skin color, texture, turgor normal. No rashes or lesions Neurologic: Alert and oriented X 3, normal strength and tone. Normal symmetric reflexes. Normal coordination and gait.     Assessment & Plan:   Problem List Items Addressed This Visit    Encounter for preventive health examination    age appropriate education and counseling updated, referrals for preventative services and immunizations addressed, dietary and smoking counseling addressed, most recent labs reviewed.  I have personally reviewed and have noted:  1) the patient's medical and social history 2)  The pt's use of alcohol, tobacco, and illicit drugs 3) The patient's current medications and supplements 4) Functional ability including ADL's, fall risk, home safety risk, hearing and visual impairment 5) Diet and physical activities 6) Evidence for depression or mood disorder 7) The patient's height, weight, and BMI have been recorded in the chart  I have made referrals, and provided counseling and education based on review of the above      Palpitations    14 day monitor noted only 5 runs of SVT lasting 6 bets or less and one 4 beat run of VT .        Syncope and collapse     Etiology not cardiogenic based on review of ECHO and Zio.  Advised to check BS during next occurrence.; glucometer instruction given         I am having Alexis James. Alexis James maintain her aspirin EC, CoQ-10, calcium carbonate, Calcium Carb-Cholecalciferol, doxylamine (Sleep), acetaminophen, docusate sodium, lovastatin, and escitalopram.  No orders of the defined types were placed in this encounter.   There are no discontinued medications.  Follow-up: No follow-ups on file.   Crecencio Mc, MD

## 2019-06-06 NOTE — Patient Instructions (Signed)
Check your blood sugar the next time you have an episode   Your annual mammogram has been ordered and is due in late August  You are encouraged (required) to call to make your appointment at [facility] [phone]  Your next bone density test will be in 2022 along with your  Colonoscopy   Health Maintenance for Postmenopausal Women Menopause is a normal process in which your reproductive ability comes to an end. This process happens gradually over a span of months to years, usually between the ages of 28 and 38. Menopause is complete when you have missed 12 consecutive menstrual periods. It is important to talk with your health care provider about some of the most common conditions that affect postmenopausal women, such as heart disease, cancer, and bone loss (osteoporosis). Adopting a healthy lifestyle and getting preventive care can help to promote your health and wellness. Those actions can also lower your chances of developing some of these common conditions. What should I know about menopause? During menopause, you may experience a number of symptoms, such as:  Moderate-to-severe hot flashes.  Night sweats.  Decrease in sex drive.  Mood swings.  Headaches.  Tiredness.  Irritability.  Memory problems.  Insomnia. Choosing to treat or not to treat menopausal changes is an individual decision that you make with your health care provider. What should I know about hormone replacement therapy and supplements? Hormone therapy products are effective for treating symptoms that are associated with menopause, such as hot flashes and night sweats. Hormone replacement carries certain risks, especially as you become older. If you are thinking about using estrogen or estrogen with progestin treatments, discuss the benefits and risks with your health care provider. What should I know about heart disease and stroke? Heart disease, heart attack, and stroke become more likely as you age. This may  be due, in part, to the hormonal changes that your body experiences during menopause. These can affect how your body processes dietary fats, triglycerides, and cholesterol. Heart attack and stroke are both medical emergencies. There are many things that you can do to help prevent heart disease and stroke:  Have your blood pressure checked at least every 1-2 years. High blood pressure causes heart disease and increases the risk of stroke.  If you are 79-39 years old, ask your health care provider if you should take aspirin to prevent a heart attack or a stroke.  Do not use any tobacco products, including cigarettes, chewing tobacco, or electronic cigarettes. If you need help quitting, ask your health care provider.  It is important to eat a healthy diet and maintain a healthy weight. ? Be sure to include plenty of vegetables, fruits, low-fat dairy products, and lean protein. ? Avoid eating foods that are high in solid fats, added sugars, or salt (sodium).  Get regular exercise. This is one of the most important things that you can do for your health. ? Try to exercise for at least 150 minutes each week. The type of exercise that you do should increase your heart rate and make you sweat. This is known as moderate-intensity exercise. ? Try to do strengthening exercises at least twice each week. Do these in addition to the moderate-intensity exercise.  Know your numbers.Ask your health care provider to check your cholesterol and your blood glucose. Continue to have your blood tested as directed by your health care provider.  What should I know about cancer screening? There are several types of cancer. Take the following steps  to reduce your risk and to catch any cancer development as early as possible. Breast Cancer  Practice breast self-awareness. ? This means understanding how your breasts normally appear and feel. ? It also means doing regular breast self-exams. Let your health care provider  know about any changes, no matter how small.  If you are 27 or older, have a clinician do a breast exam (clinical breast exam or CBE) every year. Depending on your age, family history, and medical history, it may be recommended that you also have a yearly breast X-ray (mammogram).  If you have a family history of breast cancer, talk with your health care provider about genetic screening.  If you are at high risk for breast cancer, talk with your health care provider about having an MRI and a mammogram every year.  Breast cancer (BRCA) gene test is recommended for women who have family members with BRCA-related cancers. Results of the assessment will determine the need for genetic counseling and BRCA1 and for BRCA2 testing. BRCA-related cancers include these types: ? Breast. This occurs in males or females. ? Ovarian. ? Tubal. This may also be called fallopian tube cancer. ? Cancer of the abdominal or pelvic lining (peritoneal cancer). ? Prostate. ? Pancreatic. Cervical, Uterine, and Ovarian Cancer Your health care provider may recommend that you be screened regularly for cancer of the pelvic organs. These include your ovaries, uterus, and vagina. This screening involves a pelvic exam, which includes checking for microscopic changes to the surface of your cervix (Pap test).  For women ages 21-65, health care providers may recommend a pelvic exam and a Pap test every three years. For women ages 28-65, they may recommend the Pap test and pelvic exam, combined with testing for human papilloma virus (HPV), every five years. Some types of HPV increase your risk of cervical cancer. Testing for HPV may also be done on women of any age who have unclear Pap test results.  Other health care providers may not recommend any screening for nonpregnant women who are considered low risk for pelvic cancer and have no symptoms. Ask your health care provider if a screening pelvic exam is right for you.  If you  have had past treatment for cervical cancer or a condition that could lead to cancer, you need Pap tests and screening for cancer for at least 20 years after your treatment. If Pap tests have been discontinued for you, your risk factors (such as having a new sexual partner) need to be reassessed to determine if you should start having screenings again. Some women have medical problems that increase the chance of getting cervical cancer. In these cases, your health care provider may recommend that you have screening and Pap tests more often.  If you have a family history of uterine cancer or ovarian cancer, talk with your health care provider about genetic screening.  If you have vaginal bleeding after reaching menopause, tell your health care provider.  There are currently no reliable tests available to screen for ovarian cancer. Lung Cancer Lung cancer screening is recommended for adults 46-39 years old who are at high risk for lung cancer because of a history of smoking. A yearly low-dose CT scan of the lungs is recommended if you:  Currently smoke.  Have a history of at least 30 pack-years of smoking and you currently smoke or have quit within the past 15 years. A pack-year is smoking an average of one pack of cigarettes per day for one year. Yearly  screening should:  Continue until it has been 15 years since you quit.  Stop if you develop a health problem that would prevent you from having lung cancer treatment. Colorectal Cancer  This type of cancer can be detected and can often be prevented.  Routine colorectal cancer screening usually begins at age 1 and continues through age 40.  If you have risk factors for colon cancer, your health care provider may recommend that you be screened at an earlier age.  If you have a family history of colorectal cancer, talk with your health care provider about genetic screening.  Your health care provider may also recommend using home test kits to  check for hidden blood in your stool.  A small camera at the end of a tube can be used to examine your colon directly (sigmoidoscopy or colonoscopy). This is done to check for the earliest forms of colorectal cancer.  Direct examination of the colon should be repeated every 5-10 years until age 18. However, if early forms of precancerous polyps or small growths are found or if you have a family history or genetic risk for colorectal cancer, you may need to be screened more often. Skin Cancer  Check your skin from head to toe regularly.  Monitor any moles. Be sure to tell your health care provider: ? About any new moles or changes in moles, especially if there is a change in a mole's shape or color. ? If you have a mole that is larger than the size of a pencil eraser.  If any of your family members has a history of skin cancer, especially at a young age, talk with your health care provider about genetic screening.  Always use sunscreen. Apply sunscreen liberally and repeatedly throughout the day.  Whenever you are outside, protect yourself by wearing long sleeves, pants, a wide-brimmed hat, and sunglasses. What should I know about osteoporosis? Osteoporosis is a condition in which bone destruction happens more quickly than new bone creation. After menopause, you may be at an increased risk for osteoporosis. To help prevent osteoporosis or the bone fractures that can happen because of osteoporosis, the following is recommended:  If you are 16-75 years old, get at least 1,000 mg of calcium and at least 600 mg of vitamin D per day.  If you are older than age 89 but younger than age 32, get at least 1,200 mg of calcium and at least 600 mg of vitamin D per day.  If you are older than age 9, get at least 1,200 mg of calcium and at least 800 mg of vitamin D per day. Smoking and excessive alcohol intake increase the risk of osteoporosis. Eat foods that are rich in calcium and vitamin D, and do  weight-bearing exercises several times each week as directed by your health care provider. What should I know about how menopause affects my mental health? Depression may occur at any age, but it is more common as you become older. Common symptoms of depression include:  Low or sad mood.  Changes in sleep patterns.  Changes in appetite or eating patterns.  Feeling an overall lack of motivation or enjoyment of activities that you previously enjoyed.  Frequent crying spells. Talk with your health care provider if you think that you are experiencing depression. What should I know about immunizations? It is important that you get and maintain your immunizations. These include:  Tetanus, diphtheria, and pertussis (Tdap) booster vaccine.  Influenza every year before the flu season  begins.  Pneumonia vaccine.  Shingles vaccine. Your health care provider may also recommend other immunizations. This information is not intended to replace advice given to you by your health care provider. Make sure you discuss any questions you have with your health care provider. Document Released: 01/21/2006 Document Revised: 06/18/2016 Document Reviewed: 09/02/2015 Elsevier Interactive Patient Education  2019 [employer].

## 2019-06-07 ENCOUNTER — Telehealth: Payer: Self-pay | Admitting: Cardiovascular Disease

## 2019-06-07 NOTE — Assessment & Plan Note (Signed)
Etiology not cardiogenic based on review of ECHO and Zio.  Advised to check BS during next occurrence.; glucometer instruction given

## 2019-06-07 NOTE — Assessment & Plan Note (Signed)

## 2019-06-07 NOTE — Telephone Encounter (Signed)
Patient would like results from monitor   Please call

## 2019-06-07 NOTE — Telephone Encounter (Signed)
No answer. Left detail message with that the monitor has been uploaded to Dr Donivan Scull basket yesterday, ok per DPR. Let her know were are waiting for him to review and will call her after he does. I will route to him to make him aware she is eager for the results.

## 2019-06-07 NOTE — Assessment & Plan Note (Signed)
14 day monitor noted only 5 runs of SVT lasting 6 bets or less and one 4 beat run of VT .

## 2019-06-25 ENCOUNTER — Telehealth: Payer: Self-pay | Admitting: *Deleted

## 2019-06-25 NOTE — Telephone Encounter (Signed)
-----   Message from Alexis Merritts, MD sent at 06/17/2019 10:44 AM EDT ----- Event monitor Did not show any significant arrhythmia that would cause dizzy spells or passout spells When she had the event and there was no associated arrhythmia There was very short rare episodes of tachycardia, which is what she is for now is likely of little clinical significance

## 2019-06-25 NOTE — Telephone Encounter (Signed)
Left voicemail message for patient to call back for results and recommendations.  °

## 2019-06-29 NOTE — Telephone Encounter (Signed)
Attempted to call the patient. No answer- I left a message to please call back.  

## 2019-07-03 ENCOUNTER — Observation Stay (HOSPITAL_BASED_OUTPATIENT_CLINIC_OR_DEPARTMENT_OTHER)
Admit: 2019-07-03 | Discharge: 2019-07-03 | Disposition: A | Discharge status: Home / Self Care | Payer: PPO | Attending: Internal Medicine | Admitting: Internal Medicine

## 2019-07-03 ENCOUNTER — Telehealth: Payer: Self-pay | Admitting: Cardiovascular Disease

## 2019-07-03 ENCOUNTER — Emergency Department: Payer: PPO

## 2019-07-03 ENCOUNTER — Observation Stay
Admission: EM | Admit: 2019-07-03 | Discharge: 2019-07-04 | Disposition: A | Discharge status: Home / Self Care | Payer: PPO | Attending: Internal Medicine | Admitting: Internal Medicine

## 2019-07-03 ENCOUNTER — Other Ambulatory Visit: Payer: Self-pay

## 2019-07-03 ENCOUNTER — Encounter: Payer: Self-pay | Admitting: Intensive Care

## 2019-07-03 ENCOUNTER — Telehealth: Payer: Self-pay | Admitting: Internal Medicine

## 2019-07-03 ENCOUNTER — Telehealth: Payer: Self-pay

## 2019-07-03 ENCOUNTER — Ambulatory Visit: Payer: Self-pay | Admitting: Internal Medicine

## 2019-07-03 DIAGNOSIS — R55 Syncope and collapse: Secondary | ICD-10-CM | POA: Diagnosis not present

## 2019-07-03 DIAGNOSIS — N3000 Acute cystitis without hematuria: Secondary | ICD-10-CM | POA: Insufficient documentation

## 2019-07-03 DIAGNOSIS — E119 Type 2 diabetes mellitus without complications: Secondary | ICD-10-CM | POA: Diagnosis not present

## 2019-07-03 DIAGNOSIS — S0083XA Contusion of other part of head, initial encounter: Secondary | ICD-10-CM | POA: Diagnosis not present

## 2019-07-03 DIAGNOSIS — I6782 Cerebral ischemia: Secondary | ICD-10-CM | POA: Insufficient documentation

## 2019-07-03 DIAGNOSIS — Z7982 Long term (current) use of aspirin: Secondary | ICD-10-CM | POA: Insufficient documentation

## 2019-07-03 DIAGNOSIS — Z79899 Other long term (current) drug therapy: Secondary | ICD-10-CM | POA: Diagnosis not present

## 2019-07-03 DIAGNOSIS — E785 Hyperlipidemia, unspecified: Secondary | ICD-10-CM | POA: Insufficient documentation

## 2019-07-03 DIAGNOSIS — F419 Anxiety disorder, unspecified: Secondary | ICD-10-CM | POA: Insufficient documentation

## 2019-07-03 DIAGNOSIS — Z7722 Contact with and (suspected) exposure to environmental tobacco smoke (acute) (chronic): Secondary | ICD-10-CM | POA: Diagnosis not present

## 2019-07-03 DIAGNOSIS — K5730 Diverticulosis of large intestine without perforation or abscess without bleeding: Secondary | ICD-10-CM | POA: Insufficient documentation

## 2019-07-03 DIAGNOSIS — Z03818 Encounter for observation for suspected exposure to other biological agents ruled out: Secondary | ICD-10-CM | POA: Diagnosis not present

## 2019-07-03 DIAGNOSIS — Z85828 Personal history of other malignant neoplasm of skin: Secondary | ICD-10-CM | POA: Insufficient documentation

## 2019-07-03 DIAGNOSIS — N3090 Cystitis, unspecified without hematuria: Secondary | ICD-10-CM

## 2019-07-03 DIAGNOSIS — Z1159 Encounter for screening for other viral diseases: Secondary | ICD-10-CM | POA: Insufficient documentation

## 2019-07-03 DIAGNOSIS — R911 Solitary pulmonary nodule: Secondary | ICD-10-CM | POA: Diagnosis not present

## 2019-07-03 DIAGNOSIS — I361 Nonrheumatic tricuspid (valve) insufficiency: Secondary | ICD-10-CM | POA: Diagnosis not present

## 2019-07-03 LAB — TROPONIN I (HIGH SENSITIVITY)
Troponin I (High Sensitivity): 2 ng/L (ref <18)
Troponin I (High Sensitivity): 3 ng/L (ref <18)

## 2019-07-03 LAB — BASIC METABOLIC PANEL
Anion gap: 8 (ref 5–15)
BUN: 25 mg/dL — ABNORMAL HIGH (ref 8–23)
CO2: 28 mmol/L (ref 22–32)
Calcium: 9.5 mg/dL (ref 8.9–10.3)
Chloride: 103 mmol/L (ref 98–111)
Creatinine, Ser: 0.65 mg/dL (ref 0.44–1.00)
GFR calc Af Amer: >60 mL/min (ref >60)
GFR calc non Af Amer: >60 mL/min (ref >60)
Glucose, Bld: 120 mg/dL — ABNORMAL HIGH (ref 70–99)
Potassium: 4.2 mmol/L (ref 3.5–5.1)
Sodium: 139 mmol/L (ref 135–145)

## 2019-07-03 LAB — URINALYSIS, COMPLETE (UACMP) WITH MICROSCOPIC
Bilirubin Urine: NEGATIVE
Glucose, UA: NEGATIVE mg/dL
Ketones, ur: NEGATIVE mg/dL
Nitrite: NEGATIVE
Protein, ur: NEGATIVE mg/dL
Specific Gravity, Urine: 1.017 (ref 1.005–1.030)
pH: 7 (ref 5.0–8.0)

## 2019-07-03 LAB — CBC
HCT: 41.5 % (ref 36.0–46.0)
Hemoglobin: 13.8 g/dL (ref 12.0–15.0)
MCH: 31.9 pg (ref 26.0–34.0)
MCHC: 33.3 g/dL (ref 30.0–36.0)
MCV: 96.1 fL (ref 80.0–100.0)
Platelets: 283 10*3/uL (ref 150–400)
RBC: 4.32 MIL/uL (ref 3.87–5.11)
RDW: 12.9 % (ref 11.5–15.5)
WBC: 6.9 10*3/uL (ref 4.0–10.5)
nRBC: 0 % (ref 0.0–0.2)

## 2019-07-03 LAB — SARS CORONAVIRUS 2 BY RT PCR (HOSPITAL ORDER, PERFORMED IN ~~LOC~~ HOSPITAL LAB): SARS Coronavirus 2: NEGATIVE

## 2019-07-03 LAB — GLUCOSE, CAPILLARY: Glucose-Capillary: 112 mg/dL — ABNORMAL HIGH (ref 70–99)

## 2019-07-03 MED ORDER — SODIUM CHLORIDE 0.9% FLUSH: 3.0000 mL | INTRAVENOUS | Status: DC | PRN

## 2019-07-03 MED ORDER — SODIUM CHLORIDE 0.9 % IV SOLN
INTRAVENOUS | Status: DC
  Administered 2019-07-03: 19:00:00 via INTRAVENOUS

## 2019-07-03 MED ORDER — COQ-10 100 MG PO CAPS: 100.0000 mg | ORAL_CAPSULE | Freq: Every day | ORAL | Status: DC

## 2019-07-03 MED ORDER — SODIUM CHLORIDE 0.9 % IV SOLN
1.0000 g | INTRAVENOUS | Status: DC
  Filled 2019-07-03: qty 10

## 2019-07-03 MED ORDER — DOXYLAMINE SUCCINATE (SLEEP) 25 MG PO TABS
25.0000 mg | ORAL_TABLET | Freq: Every evening | ORAL | Status: DC | PRN
  Filled 2019-07-03: qty 1

## 2019-07-03 MED ORDER — ACETAMINOPHEN 650 MG RE SUPP: 650.0000 mg | Freq: Four times a day (QID) | RECTAL | Status: DC | PRN

## 2019-07-03 MED ORDER — CALCIUM CARBONATE-VITAMIN D 500-200 MG-UNIT PO TABS
1.0000 | ORAL_TABLET | Freq: Two times a day (BID) | ORAL | Status: DC
  Administered 2019-07-03: 1 via ORAL
  Filled 2019-07-03: qty 1

## 2019-07-03 MED ORDER — SODIUM CHLORIDE 0.9% FLUSH
3.0000 mL | Freq: Two times a day (BID) | INTRAVENOUS | Status: DC
  Administered 2019-07-04: 10:00:00 3 mL via INTRAVENOUS

## 2019-07-03 MED ORDER — SODIUM CHLORIDE 0.9 % IV BOLUS
500.0000 mL | Freq: Once | INTRAVENOUS | Status: AC
  Administered 2019-07-03: 500 mL via INTRAVENOUS

## 2019-07-03 MED ORDER — PRAVASTATIN SODIUM 20 MG PO TABS
20.0000 mg | ORAL_TABLET | Freq: Every day | ORAL | Status: DC
  Administered 2019-07-03: 20 mg via ORAL
  Filled 2019-07-03: qty 1

## 2019-07-03 MED ORDER — SODIUM CHLORIDE 0.9% FLUSH
3.0000 mL | Freq: Two times a day (BID) | INTRAVENOUS | Status: DC
  Administered 2019-07-04: 3 mL via INTRAVENOUS

## 2019-07-03 MED ORDER — ASPIRIN EC 81 MG PO TBEC: 81.0000 mg | DELAYED_RELEASE_TABLET | ORAL | Status: DC

## 2019-07-03 MED ORDER — SENNOSIDES-DOCUSATE SODIUM 8.6-50 MG PO TABS: 1.0000 | ORAL_TABLET | Freq: Every evening | ORAL | Status: DC | PRN

## 2019-07-03 MED ORDER — ONDANSETRON HCL 4 MG/2ML IJ SOLN: 4.0000 mg | Freq: Four times a day (QID) | INTRAMUSCULAR | Status: DC | PRN

## 2019-07-03 MED ORDER — ESCITALOPRAM OXALATE 10 MG PO TABS
5.0000 mg | ORAL_TABLET | Freq: Every day | ORAL | Status: DC
  Administered 2019-07-03: 22:00:00 5 mg via ORAL
  Filled 2019-07-03 (×2): qty 0.5

## 2019-07-03 MED ORDER — ACETAMINOPHEN 325 MG PO TABS: 650.0000 mg | ORAL_TABLET | Freq: Four times a day (QID) | ORAL | Status: DC | PRN

## 2019-07-03 MED ORDER — ONDANSETRON HCL 4 MG PO TABS: 4.0000 mg | ORAL_TABLET | Freq: Four times a day (QID) | ORAL | Status: DC | PRN

## 2019-07-03 MED ORDER — SODIUM CHLORIDE 0.9 % IV SOLN: 250.0000 mL | INTRAVENOUS | Status: DC | PRN

## 2019-07-03 MED ORDER — SODIUM CHLORIDE 0.9 % IV SOLN
1.0000 g | Freq: Once | INTRAVENOUS | Status: AC
  Administered 2019-07-03: 16:00:00 1 g via INTRAVENOUS
  Filled 2019-07-03: qty 10

## 2019-07-03 MED ORDER — ENOXAPARIN SODIUM 40 MG/0.4ML ~~LOC~~ SOLN
40.0000 mg | SUBCUTANEOUS | Status: DC
  Administered 2019-07-03: 40 mg via SUBCUTANEOUS
  Filled 2019-07-03: qty 0.4

## 2019-07-03 NOTE — Telephone Encounter (Signed)
Copied from [place] [id number]. Topic: General - Inquiry >> Jul 03, 2019 10:36 AM Scherrie Gerlach wrote: Reason for CRM: pt would like to know why she cannot come to her own dr, and do her labs at [place] office.  Pt does not feel this is emergency, and would like to come there. There is a triage note. Pt also states she believes she is to have appt with Dr Derrel Nip on Thursday, but I do not see any appt for her. Pt is NOT going to ED or UC.

## 2019-07-03 NOTE — H&P (Signed)
[facility] NAME: Alexis James    MR#:  [id number]  DATE OF BIRTH:  06-Aug-1951  DATE OF ADMISSION:  07/03/2019  PRIMARY CARE PHYSICIAN: Crecencio Mc, MD   REQUESTING/REFERRING PHYSICIAN:   CHIEF COMPLAINT:   Chief Complaint  Patient presents with  . Loss of Consciousness    HISTORY OF PRESENT ILLNESS: Alexis James  is a 68 y.o. female with a known history of skin cancer, prediabetes, hyperlipidemia, anxiety disorder presented to the emergency room for passing out.  Patient was sitting in the chair try to get up to reach her cell phone she felt dizzy lightheaded and was sweating.  Patient passed out.  She was brought to the emergency room by the husband.  She is awake alert and responds to all verbal commands.  High-sensitivity troponin is negative.  Patient was worked up with CT head which showed no acute abnormality. Hospitalist service was consulted for further care.  PAST MEDICAL HISTORY:   Past Medical History:  Diagnosis Date  . Anxiety   . Cancer ([place])    skin  . Diabetes mellitus without complication ([place])   . Hyperlipidemia     PAST SURGICAL HISTORY:  Past Surgical History:  Procedure Laterality Date  . BREAST SURGERY     breast reduction  . CATARACT EXTRACTION W/PHACO Right 10/18/2017   Procedure: CATARACT EXTRACTION PHACO AND INTRAOCULAR LENS PLACEMENT (IOC);  Surgeon: Birder Robson, MD;  Location: [facility] ORS;  Service: Ophthalmology;  Laterality: Right;  [place] 00:27 AP% 19.7 CDE5.32 fluid pack lot # [id number]  . CATARACT EXTRACTION W/PHACO Left 11/21/2018   Procedure: CATARACT EXTRACTION PHACO AND INTRAOCULAR LENS PLACEMENT (IOC);  Surgeon: Birder Robson, MD;  Location: [facility] ORS;  Service: Ophthalmology;  Laterality: Left;  [place] 00:31.3 CDE 5.38 Fluid Pack Lot # [id number]  . CESAREAN SECTION    . REDUCTION MAMMAPLASTY    . smr      SOCIAL HISTORY:  Social History   Tobacco Use  . Smoking status:  Passive Smoke Exposure - Never Smoker  . Smokeless tobacco: Never Used  . Tobacco comment: Husband smoked x 38 years (quit 4 years ago)  Substance Use Topics  . Alcohol use: Yes    Comment: Occasional    FAMILY HISTORY:  Family History  Problem Relation Age of Onset  . Colitis Mother 41  . Mental retardation Father 4  . Heart Problems Brother   . Transient ischemic attack Sister   . Cancer Neg Hx     DRUG ALLERGIES: No Known Allergies  REVIEW OF SYSTEMS:   CONSTITUTIONAL: No fever, fatigue or weakness.  EYES: No blurred or double vision.  EARS, NOSE, AND THROAT: No tinnitus or ear pain.  RESPIRATORY: No cough, shortness of breath, wheezing or hemoptysis.  CARDIOVASCULAR: No chest pain, orthopnea, edema.  GASTROINTESTINAL: Had nausea, no vomiting, diarrhea or abdominal pain.  GENITOURINARY: No dysuria, hematuria.  ENDOCRINE: No polyuria, nocturia,  HEMATOLOGY: No anemia, easy bruising or bleeding SKIN: No rash or lesion. MUSCULOSKELETAL: No joint pain or arthritis.   NEUROLOGIC: No tingling, numbness, weakness.  PSYCHIATRY: No anxiety or depression.   MEDICATIONS AT HOME:  Prior to Admission medications   Medication Sig Start Date End Date Taking? Authorizing Provider  aspirin EC 81 MG tablet Take 81 mg by mouth 2 (two) times a week. Tues and Thurs   Yes [provider]  Calcium Carb-Cholecalciferol (CALCIUM 600+D) 600-800 MG-UNIT TABS Take 1 tablet by mouth 2 (  two) times daily.   Yes [provider]  Coenzyme Q10 (COQ-10) 100 MG CAPS Take 100 mg by mouth daily.   Yes [provider]  escitalopram (LEXAPRO) 5 MG tablet Take 1 tablet by mouth once daily Patient taking differently: Take 5 mg by mouth daily.  06/05/19  Yes Crecencio Mc, MD  lovastatin (MEVACOR) 40 MG tablet TAKE 1 TABLET BY MOUTH AT BEDTIME Patient taking differently: Take 40 mg by mouth at bedtime.  12/22/18  Yes Crecencio Mc, MD  acetaminophen (TYLENOL) 500 MG tablet Take  1,000 mg by mouth every 6 (six) hours as needed for moderate pain.    [provider]  calcium carbonate (TUMS - DOSED IN MG ELEMENTAL CALCIUM) 500 MG chewable tablet Chew 2 tablets by mouth daily as needed for indigestion or heartburn.    [provider]  docusate sodium (COLACE) 100 MG capsule Take 100 mg by mouth daily as needed for mild constipation.    [provider]  doxylamine, Sleep, (UNISOM) 25 MG tablet Take 25 mg by mouth at bedtime as needed for sleep.    [provider]      PHYSICAL EXAMINATION:   VITAL SIGNS: Blood pressure 137/63, pulse 67, temperature 98.2 F (36.8 C), temperature source Oral, resp. rate 20, height 5\' 2"  (1.575 m), weight 60.3 kg, SpO2 96 %.  GENERAL:  68 y.o.-year-old patient lying in the bed with no acute distress.  EYES: Pupils equal, round, reactive to light and accommodation. No scleral icterus. Extraocular muscles intact.  HEENT: Head atraumatic, normocephalic. Oropharynx and nasopharynx clear.  NECK:  Supple, no jugular venous distention. No thyroid enlargement, no tenderness.  LUNGS: Normal breath sounds bilaterally, no wheezing, rales,rhonchi or crepitation. No use of accessory muscles of respiration.  CARDIOVASCULAR: S1, S2 normal. No murmurs, rubs, or gallops.  ABDOMEN: Soft, nontender, nondistended. Bowel sounds present. No organomegaly or mass.  EXTREMITIES: No pedal edema, cyanosis, or clubbing.  NEUROLOGIC: Cranial nerves II through XII are intact. Muscle strength 5/5 in all extremities. Sensation intact. Gait not checked.  PSYCHIATRIC: The patient is alert and oriented x 3.  SKIN: No obvious rash, lesion, or ulcer.   LABORATORY PANEL:   CBC Recent Labs  Lab 07/03/19 1212  WBC 6.9  HGB 13.8  HCT 41.5  PLT 283  MCV 96.1  MCH 31.9  MCHC 33.3  RDW 12.9   ------------------------------------------------------------------------------------------------------------------  Chemistries  Recent Labs   Lab 07/03/19 1212  NA 139  K 4.2  CL 103  CO2 28  GLUCOSE 120*  BUN 25*  CREATININE 0.65  CALCIUM 9.5   ------------------------------------------------------------------------------------------------------------------ estimated creatinine clearance is 56.8 mL/min (by C-G formula based on SCr of 0.65 mg/dL). ------------------------------------------------------------------------------------------------------------------ No results for input(s): TSH, T4TOTAL, T3FREE, THYROIDAB in the last 72 hours.  Invalid input(s): FREET3   Coagulation profile No results for input(s): INR, PROTIME in the last 168 hours. ------------------------------------------------------------------------------------------------------------------- No results for input(s): DDIMER in the last 72 hours. -------------------------------------------------------------------------------------------------------------------  Cardiac Enzymes No results for input(s): CKMB, TROPONINI, MYOGLOBIN in the last 168 hours.  Invalid input(s): CK ------------------------------------------------------------------------------------------------------------------ Invalid input(s): POCBNP  ---------------------------------------------------------------------------------------------------------------  Urinalysis    Component Value Date/Time   COLORURINE YELLOW (A) 07/03/2019 1250   APPEARANCEUR HAZY (A) 07/03/2019 1250   LABSPEC 1.017 07/03/2019 1250   PHURINE 7.0 07/03/2019 1250   GLUCOSEU NEGATIVE 07/03/2019 1250   HGBUR SMALL (A) 07/03/2019 1250   BILIRUBINUR NEGATIVE 07/03/2019 1250   KETONESUR NEGATIVE 07/03/2019 1250   PROTEINUR NEGATIVE 07/03/2019 1250  NITRITE NEGATIVE 07/03/2019 1250   LEUKOCYTESUR MODERATE (A) 07/03/2019 1250     RADIOLOGY: Ct Head Wo Contrast  Result Date: 07/03/2019 CLINICAL DATA:  Syncopal episode this morning. EXAM: CT HEAD WITHOUT CONTRAST TECHNIQUE: Contiguous axial images were  obtained from the base of the skull through the vertex without intravenous contrast. COMPARISON:  11/03/2009. FINDINGS: Brain: Stable minimally enlarged ventricles and subarachnoid spaces. Minimal patchy white matter low density in both cerebral hemispheres. No intracranial hemorrhage, mass lesion or CT evidence of acute infarction. Vascular: No hyperdense vessel or unexpected calcification. Skull: Normal. Negative for fracture or focal lesion. Sinuses/Orbits: Status post bilateral cataract extraction. Unremarkable bones and included paranasal sinuses. Other: None. IMPRESSION: No acute abnormality. Stable minimal diffuse cerebral and cerebellar atrophy and minimal chronic small vessel white matter ischemic changes in both cerebral hemispheres. Electronically Signed   By: Claudie Revering M.D.   On: 07/03/2019 12:54   Dg Chest Portable 1 View  Result Date: 07/03/2019 CLINICAL DATA:  Syncope today. EXAM: PORTABLE CHEST 1 VIEW COMPARISON:  03/28/2009 FINDINGS: The heart size and mediastinal contours are within normal limits. Both lungs are clear except for a chronic tiny calcified granuloma in the left midzone, unchanged. The visualized skeletal structures are unremarkable. IMPRESSION: No active disease.  Normal heart size. Electronically Signed   By: Lorriane Shire M.D.   On: 07/03/2019 13:44    EKG: Orders placed or performed during the hospital encounter of 07/03/19  . EKG 12-Lead  . EKG 12-Lead  . ED EKG  . ED EKG    IMPRESSION AND PLAN: 68 year old female patient with a known history of skin cancer, prediabetes, hyperlipidemia, anxiety disorder presented to the emergency room for passing out.    -Syncope Probably vasovagal in etiology IV fluids Admit under observation bed Check orthostatics Cycle troponin Check echocardiogram and cardiology consult  -Hyperlipidemia Continue statin medication  -DVT prophylaxis subcu Lovenox daily  All the records are reviewed and case discussed with ED  provider. Management plans discussed with the patient, family and they are in agreement.  CODE STATUS:Full code  TOTAL TIME TAKING CARE OF THIS PATIENT: 52 minutes.    Saundra Shelling M.D on 07/03/2019 at 3:47 PM  Between 7am to 6pm - Pager - [phone]  After 6pm go to [email or web address] - password [email or web address] [facility]  Office  [phone]  CC: Primary care physician; Crecencio Mc, MD

## 2019-07-03 NOTE — Telephone Encounter (Signed)
Message was routed incorrectly  Needs to be seen Wednesday 2:30  IN OFFICE

## 2019-07-03 NOTE — ED Notes (Signed)
Pt provided w/diet ginger ale, graham crackers/ peanut butter, per pt's request.

## 2019-07-03 NOTE — Telephone Encounter (Signed)
Called and spoke to pt.  Pt said that she was feeling ok and did not want to go to ED or UC.  Pt wanted to schedule an appt with Dr. Derrel Nip on Thursday.  After asking pt if she had any injuries or head injury pt said that she had hit her head.  Pt was not sure if she hit her head on the door or the floor when she passed out.  Pt said that she had a knot with a gash on her head.    Pt still wanted to come into office to get glucose blood checked. Advised pt to go to [facility] or ED since she had a head injury and not to wait for an appt on Thursday.  Pt agreed to go to UC.  Pt said she will go to [facility].  Gave pt number to [facility].  Pt said that no one was there when she fell but someone was with pt at time of call.  Pt said that she had someone to drive her to [facility].  Notes forwarded to PCP for review.

## 2019-07-03 NOTE — ED Notes (Signed)
Sent rainbow to lab. 

## 2019-07-03 NOTE — ED Triage Notes (Signed)
Patient reports LOC this AM. Around 8:30 reports she started sweating profusely and felt dizzy, so she grabbed orange juice and her phone to call her neighbor and then reports waking up on the floor. Reports occassionally feels like her sugar is dropping and needs to eat. Patient reports she is not diagnosed with diabetes but her PCP has prescribed her items to check her blood sugar daily. Patient A&O x4 in triage.

## 2019-07-03 NOTE — Telephone Encounter (Signed)
Returned the [employer]. Addressed by the pt pcp. Agreeable with their plan. Pt is to call back if any cardiology related concerns.

## 2019-07-03 NOTE — Progress Notes (Signed)
Advanced care plan. Purpose of the Encounter: CODE STATUS Parties in Attendance: Patient Patient's Decision Capacity: Good Subjective/Patient's story: Alexis James  is a 68 y.o. female with a known history of skin cancer, prediabetes, hyperlipidemia, anxiety disorder presented to the emergency room for passing out.  Patient was sitting in the chair try to get up to reach her cell phone she felt dizzy lightheaded and was sweating.  Patient passed out Objective/Medical story Patient needs evaluation for syncope with serial troponin and echocardiogram. Needs cardiology evaluation. Goals of care determination:  Advance care directives goals of care treatment plan discussed Patient wants everything done which includes CPR, intubation ventilator if the need arises. CODE STATUS: Full code Time spent discussing advanced care planning: 16 minutes

## 2019-07-03 NOTE — Telephone Encounter (Signed)
Notes from call reviewed .please schedule an office  visit in the office so she can be taught how to check her blood sugar   Use the 2:30 slot on Wednesday ( I do not know why it has been blocked)

## 2019-07-03 NOTE — Telephone Encounter (Signed)
The patient is currently being evaluated in the ER at [facility].

## 2019-07-03 NOTE — ED Notes (Signed)
Pt denies SHOB, CP, dizziness, N/V at this time. Pt A/Ox4. EDP Quentin Cornwall at bedside.

## 2019-07-03 NOTE — Care Management (Signed)
MOON delivered and explained to patient. She is independent from home with her husband. PCP Dr. Derrel Nip. Drives.

## 2019-07-03 NOTE — Telephone Encounter (Signed)
Called back and spoke to pt.  Pt said that this is an old message.  Pt said that she is going to UC.  Pt said that her husband was there to drive her to the [facility].

## 2019-07-03 NOTE — Consult Note (Signed)
Cardiology Consultation:   Patient ID: Alexis James MRN: [id number]; DOB: May 20, 1951  Admit date: 07/03/2019 Date of Consult: 07/03/2019  Primary Care Provider: Crecencio Mc, MD Primary Cardiologist: Esmond Plants, MD PhD Primary Electrophysiologist:  None    Patient Profile:   Alexis James is a 68 y.o. female with a hx of prediabetes, hyperlipidemia, and anxiety who is being seen today for the evaluation of syncope at the request of [place].  History of Present Illness:   Alexis James was in her usual state of health this morning, though she reports sleeping a little later than usual because she had to get up to void several times overnight.  About 30 minutes after waking up, she began feeling diaphoretic and lightheaded while seated in her kitchen.  She was concerned that her blood sugar might be low (though she does not take any hypoglycemic agents) and went to get some orange juice.  She also checked her blood pressure and noted it to be low normal.  She tried to check her blood sugar with her glucometer but was unable to obtain enough blood from the fingerstick.  She subsequently stood up to check her blood pressure again and then briefly passed out.  She believes that she was only out for a minute or two but struck her head in the process.  She was able to summon a neighbor for help and was brought to [facility] for further evaluation.  Head CT did not reveal any acute intracranial abnormalities.  Alexis James had a similar syncopal episode in May, which led to 14-day event monitor placement.  This did not show any sustained arrhythmia to explain her syncope.  She does not have any preceding palpitations, but feels like her heart is racing when she comes back to.  She denies chest pain, shortness of breath, orthopnea, PND, edema, and focal weakness.  Alexis James admits that she does not drink much water, typically drinking sodas during the day.  She was out and about yesterday, going several places in  her car.  She was not outside much, however.  Prior episode in May occurred the night after after having walked [place] the beach for [place] miles.  That night, she got up to use the bathroom and became lightheaded walking back to her bed.  She was able to fall onto the bed and was unconscious for about 1 minute.  Heart Pathway Score:     Past Medical History:  Diagnosis Date  . Anxiety   . Cancer ([place])    skin  . Diabetes mellitus without complication ([place])   . Hyperlipidemia     Past Surgical History:  Procedure Laterality Date  . BREAST SURGERY     breast reduction  . CATARACT EXTRACTION W/PHACO Right 10/18/2017   Procedure: CATARACT EXTRACTION PHACO AND INTRAOCULAR LENS PLACEMENT (IOC);  Surgeon: Birder Robson, MD;  Location: [facility] ORS;  Service: Ophthalmology;  Laterality: Right;  [place] 00:27 AP% 19.7 CDE5.32 fluid pack lot # [id number]  . CATARACT EXTRACTION W/PHACO Left 11/21/2018   Procedure: CATARACT EXTRACTION PHACO AND INTRAOCULAR LENS PLACEMENT (IOC);  Surgeon: Birder Robson, MD;  Location: [facility] ORS;  Service: Ophthalmology;  Laterality: Left;  [place] 00:31.3 CDE 5.38 Fluid Pack Lot # [id number]  . CESAREAN SECTION    . REDUCTION MAMMAPLASTY    . smr       Home Medications:  Prior to Admission medications   Medication Sig Start Date Ilithyia Titzer Date Taking? Authorizing Provider  aspirin EC 81 MG  tablet Take 81 mg by mouth 2 (two) times a week. Tues and Thurs   Yes [provider]  Calcium Carb-Cholecalciferol (CALCIUM 600+D) 600-800 MG-UNIT TABS Take 1 tablet by mouth 2 (two) times daily.   Yes [provider]  Coenzyme Q10 (COQ-10) 100 MG CAPS Take 100 mg by mouth daily.   Yes [provider]  escitalopram (LEXAPRO) 5 MG tablet Take 1 tablet by mouth once daily Patient taking differently: Take 5 mg by mouth daily.  06/05/19  Yes Crecencio Mc, MD  lovastatin (MEVACOR) 40 MG tablet TAKE 1 TABLET BY MOUTH AT BEDTIME Patient taking differently: Take 40 mg  by mouth at bedtime.  12/22/18  Yes Crecencio Mc, MD  acetaminophen (TYLENOL) 500 MG tablet Take 1,000 mg by mouth every 6 (six) hours as needed for moderate pain.    [provider]  calcium carbonate (TUMS - DOSED IN MG ELEMENTAL CALCIUM) 500 MG chewable tablet Chew 2 tablets by mouth daily as needed for indigestion or heartburn.    [provider]  docusate sodium (COLACE) 100 MG capsule Take 100 mg by mouth daily as needed for mild constipation.    [provider]  doxylamine, Sleep, (UNISOM) 25 MG tablet Take 25 mg by mouth at bedtime as needed for sleep.    [provider]    Inpatient Medications: Scheduled Meds:  Continuous Infusions: . cefTRIAXone (ROCEPHIN)  IV    . [START ON 07/04/2019] cefTRIAXone (ROCEPHIN)  IV     PRN Meds:   Allergies:   No Known Allergies  Social History:   Social History   Tobacco Use  . Smoking status: Passive Smoke Exposure - Never Smoker  . Smokeless tobacco: Never Used  . Tobacco comment: Husband smoked x 38 years (quit 4 years ago)  Substance Use Topics  . Alcohol use: Yes    Comment: Occasional  . Drug use: No     Family History:   Family History  Problem Relation Age of Onset  . Colitis Mother 42  . Mental retardation Father 59  . Heart Problems Brother   . Transient ischemic attack Sister   . Cancer Neg Hx      ROS:  Please see the history of present illness. All other ROS reviewed and negative.     Physical Exam/Data:   Vitals:   07/03/19 1348 07/03/19 1350 07/03/19 1430 07/03/19 1500  BP: (!) 131/56 (!) 118/59 135/75 137/63  Pulse:   68 67  Resp:      Temp:      TempSrc:      SpO2:   100% 96%  Weight:      Height:       No intake or output data in the 24 hours ending 07/03/19 1538 Last 3 Weights 07/03/2019 06/06/2019 11/21/2018  Weight (lbs) 133 lb 133 lb 3.2 oz 130 lb 8.2 oz  Weight (kg) 60.328 kg 60.419 kg 59.2 kg     Body mass index is 24.33 kg/m.  General:  Well  nourished, well developed, in no acute distress HEENT: normal Lymph: no adenopathy Neck: no JVD Endocrine:  No thryomegaly Vascular: No carotid bruits; FA pulses 2+ bilaterally without bruits  Cardiac:  normal S1, S2; RRR; no murmurs, rubs, or gallops Lungs:  clear to auscultation bilaterally, no wheezing, rhonchi or rales  Abd: soft, nontender, no hepatomegaly  Ext: no edema Musculoskeletal:  No deformities, BUE and BLE strength normal and equal Skin: warm and dry  Neuro:  CNs 2-12 intact, no focal abnormalities noted Psych:  Normal affect   EKG:  The EKG was personally reviewed and demonstrates: Normal sinus rhythm with poor R wave progression.  Relevant CV Studies: 14-day event monitor (05/11/2019): Predominantly sinus rhythm with average rate of 77 bpm.  1 4 beat run of NSVT.  5 atrial runs lasting up to 7 beats with a maximal rate of 179 bpm.  Rare PACs and PVCs noted.  Patient triggered events not associated with a significant arrhythmia.  TTE (10/26/2018): Normal LV size with LVEF of 55-60%.  Normal wall motion.  Grade 1 diastolic dysfunction.  Mild mitral regurgitation.  Normal RV size and function.  Normal pulmonary artery pressure.  Laboratory Data:  High Sensitivity Troponin:   Recent Labs  Lab 07/03/19 1344  TROPONINIHS 2     Cardiac EnzymesNo results for input(s): TROPONINI in the last 168 hours. No results for input(s): TROPIPOC in the last 168 hours.  Chemistry Recent Labs  Lab 07/03/19 1212  NA 139  K 4.2  CL 103  CO2 28  GLUCOSE 120*  BUN 25*  CREATININE 0.65  CALCIUM 9.5  GFRNONAA >60  GFRAA >60  ANIONGAP 8    No results for input(s): PROT, ALBUMIN, AST, ALT, ALKPHOS, BILITOT in the last 168 hours. Hematology Recent Labs  Lab 07/03/19 1212  WBC 6.9  RBC 4.32  HGB 13.8  HCT 41.5  MCV 96.1  MCH 31.9  MCHC 33.3  RDW 12.9  PLT 283   BNPNo results for input(s): BNP, PROBNP in the last 168 hours.  DDimer No results for input(s): DDIMER in the  last 168 hours.   Radiology/Studies:  Ct Head Wo Contrast  Result Date: 07/03/2019 CLINICAL DATA:  Syncopal episode this morning. EXAM: CT HEAD WITHOUT CONTRAST TECHNIQUE: Contiguous axial images were obtained from the base of the skull through the vertex without intravenous contrast. COMPARISON:  11/03/2009. FINDINGS: Brain: Stable minimally enlarged ventricles and subarachnoid spaces. Minimal patchy white matter low density in both cerebral hemispheres. No intracranial hemorrhage, mass lesion or CT evidence of acute infarction. Vascular: No hyperdense vessel or unexpected calcification. Skull: Normal. Negative for fracture or focal lesion. Sinuses/Orbits: Status post bilateral cataract extraction. Unremarkable bones and included paranasal sinuses. Other: None. IMPRESSION: No acute abnormality. Stable minimal diffuse cerebral and cerebellar atrophy and minimal chronic small vessel white matter ischemic changes in both cerebral hemispheres. Electronically Signed   By: Claudie Revering M.D.   On: 07/03/2019 12:54   Dg Chest Portable 1 View  Result Date: 07/03/2019 CLINICAL DATA:  Syncope today. EXAM: PORTABLE CHEST 1 VIEW COMPARISON:  03/28/2009 FINDINGS: The heart size and mediastinal contours are within normal limits. Both lungs are clear except for a chronic tiny calcified granuloma in the left midzone, unchanged. The visualized skeletal structures are unremarkable. IMPRESSION: No active disease.  Normal heart size. Electronically Signed   By: Lorriane Shire M.D.   On: 07/03/2019 13:44    Assessment and Plan:   Syncope: Description of symptoms, including diaphoresis and progressive lightheadedness leading to syncope are most consistent with vasovagal mechanism.  I wonder if ongoing UTI with increased urinary frequency may have exacerbated things.  Echocardiogram and 2019 did not show any structural abnormality.  Recent heart monitor showed PACs and PVCs as well as brief episodes of NSVT and PSVT.   Obtain orthostatic vital signs.  Gentle hydration; encourage increased fluid intake and avoidance of caffeine/alcohol.  The patient has not had any angina, we will obtain a pharmacologic  myocardial perfusion stress test tomorrow to exclude severe ischemia leading to syncope.  Echocardiogram has been ordered by the primary service, which we will follow-up on.  Cystitis: Urinalysis suggestive of urinary tract infection.  Patient has not had any fevers, chills, or dysuria, but reports increased urinary frequency over the last 1 to 2 days.  She was given ceftriaxone in the ED.  Follow-up urine culture.  Antimicrobial therapy per primary team.  For questions or updates, please contact [place] Please consult [email or web address] for contact info under [facility] Cardiology.  Signed, Nelva Bush, MD  07/03/2019 3:38 PM

## 2019-07-03 NOTE — ED Notes (Signed)
Pt ambulatory to toilet with a steady gait. Pt denies pain at this time.

## 2019-07-03 NOTE — ED Notes (Signed)
ED Provider, Quentin Cornwall at bedside.

## 2019-07-03 NOTE — Telephone Encounter (Signed)
Pt c/o Syncope: STAT if syncope occurred within 30 minutes and pt complains of lightheadedness High Priority if episode of passing out, completely, today or in last 24 hours   1. Did you pass out today? Yes  2. When is the last time you passed out? This morning as taking BP (felt shakey and clammy)  3. Has this occurred multiple times? About a month  4. Did you have any symptoms prior to passing out? sweating   Patients primary doctors cannot see her today but does recommend she get some lab work done (glucose and sugar possibly)

## 2019-07-03 NOTE — Progress Notes (Signed)

## 2019-07-03 NOTE — Telephone Encounter (Signed)
Pt states "Passed out this AM at 0900." Reports checked BP prior to episode, 125/62  HR 62.  States "Felt a little shaky, drank orange juice and tried to check my blood sugar, tried 3 finger pricks, could not get any blood". States "I felt real anxious, hot and clammy and when I got up I fainted." l remember everything so I don't think I' was out long." Unwitnessed fall.  States "I feel fine now."  Also reports this has happened last month and "Almost passed out last Friday."  States "Small" laceration at right temple, no bleeding. Advised ED/UC. Pt states Dr. Derrel Nip has been following her with this issue.  Called practice, [facility], informed no availability until Thursday with Dr. Derrel Nip and only virtual appts at this time. Pt made aware; TN reiterated need for thorough eval at UC/ED. Marland Kitchen Pt states she will call cardiologist. States "I may go to UC after I call my insurance."  Pts CB# [phone]  Reason for Disposition . [1] Age > 50 years  AND [2] now alert and feels fine  Answer Assessment - Initial Assessment Questions 1. ONSET: "How long were you unconscious?" (minutes) "When did it happen?"     "Not long, I remember everything" 2. CONTENT: "What happened during period of unconsciousness?" (e.g., seizure activity)      I remember everything 3. MENTAL STATUS: "Alert and oriented now?" (oriented x 3 = name, month, location)      yes 4. TRIGGER: "What do you think caused the fainting?" "What were you doing just before you fainted?"  (e.g., exercise, sudden standing up, prolonged standing)     Anxious 5. RECURRENT SYMPTOM: "Have you ever passed out before?" If so, ask: "When was the last time?" and "What happened that time?"     Yes  6. INJURY: "Did you sustain any injury during the fall?"      Laceration on forehead, "Small" 7. CARDIAC SYMPTOMS: "Have you had any of the following symptoms: chest pain, difficulty breathing, palpitations?"     no 8. NEUROLOGIC SYMPTOMS: "Have you had any of the  following symptoms: headache, numbness, vertigo, weakness?"     no 9. GI SYMPTOMS: "Have you had any of the following symptoms: abdominal pain, vomiting, diarrhea, blood in stools?"     no 10. OTHER SYMPTOMS: "Do you have any other symptoms?"       Got hot and clammy, then anxious "I think that's why I fell."  Protocols used: [facility]

## 2019-07-03 NOTE — ED Notes (Signed)
ED TO INPATIENT HANDOFF REPORT  ED Nurse Name and Phone #: [phone]  S Name/Age/Gender Alexis James 68 y.o. female Room/Bed: ED18A/ED18A  Code Status   Code Status: Not on file  Home/SNF/Other Home A/Ox4 Is this baseline? Yes   Triage Complete: Triage complete  Chief Complaint syncope  Triage Note Patient reports LOC this AM. Around 8:30 reports she started sweating profusely and felt dizzy, so she grabbed orange juice and her phone to call her neighbor and then reports waking up on the floor. Reports occassionally feels like her sugar is dropping and needs to eat. Patient reports she is not diagnosed with diabetes but her PCP has prescribed her items to check her blood sugar daily. Patient A&O x4 in triage.    Allergies No Known Allergies  Level of Care/Admitting Diagnosis ED Disposition    ED Disposition Condition Comment   Admit  The patient appears reasonably stabilized for admission considering the current resources, flow, and capabilities available in the ED at this time, and I doubt any other [facility] requiring further screening and/or treatment in the ED prior to admission is  present.       B Medical/Surgery History Past Medical History:  Diagnosis Date  . Anxiety   . Cancer ([place])    skin  . Diabetes mellitus without complication ([place])   . Hyperlipidemia    Past Surgical History:  Procedure Laterality Date  . BREAST SURGERY     breast reduction  . CATARACT EXTRACTION W/PHACO Right 10/18/2017   Procedure: CATARACT EXTRACTION PHACO AND INTRAOCULAR LENS PLACEMENT (IOC);  Surgeon: Birder Robson, MD;  Location: [facility] ORS;  Service: Ophthalmology;  Laterality: Right;  [place] 00:27 AP% 19.7 CDE5.32 fluid pack lot # [id number]  . CATARACT EXTRACTION W/PHACO Left 11/21/2018   Procedure: CATARACT EXTRACTION PHACO AND INTRAOCULAR LENS PLACEMENT (IOC);  Surgeon: Birder Robson, MD;  Location: [facility] ORS;  Service: Ophthalmology;  Laterality: Left;  [place] 00:31.3 CDE  5.38 Fluid Pack Lot # [id number]  . CESAREAN SECTION    . REDUCTION MAMMAPLASTY    . smr       A IV Location/Drains/Wounds Patient Lines/Drains/Airways Status   Active Line/Drains/Airways    Name:   Placement date:   Placement time:   Site:   Days:   Peripheral IV 07/03/19 Right Arm   07/03/19    1335    Arm   less than 1   Airway   10/18/17    1142     623   Incision (Closed) 10/18/17 Eye Right   10/18/17    1144     623   Incision (Closed) 11/21/18 Eye Left   11/21/18    0821     224          Intake/Output Last 24 hours No intake or output data in the 24 hours ending 07/03/19 1519  Labs/Imaging Results for orders placed or performed during the hospital encounter of 07/03/19 (from the past 48 hour(s))  Glucose, capillary     Status: Abnormal   Collection Time: 07/03/19 12:11 PM  Result Value Ref Range   Glucose-Capillary 112 (H) 70 - 99 mg/dL  Basic metabolic panel     Status: Abnormal   Collection Time: 07/03/19 12:12 PM  Result Value Ref Range   Sodium 139 135 - 145 mmol/L   Potassium 4.2 3.5 - 5.1 mmol/L   Chloride 103 98 - 111 mmol/L   CO2 28 22 - 32 mmol/L   Glucose, Bld 120 (H) 70 -  99 mg/dL   BUN 25 (H) 8 - 23 mg/dL   Creatinine, Ser 0.65 0.44 - 1.00 mg/dL   Calcium 9.5 8.9 - 10.3 mg/dL   GFR calc non Af Amer >60 >60 mL/min   GFR calc Af Amer >60 >60 mL/min   Anion gap 8 5 - 15    Comment: Performed at [facility], [address] [place], [place] [address]  CBC     Status: None   Collection Time: 07/03/19 12:12 PM  Result Value Ref Range   WBC 6.9 4.0 - 10.5 K/uL   RBC 4.32 3.87 - 5.11 MIL/uL   Hemoglobin 13.8 12.0 - 15.0 g/dL   HCT 41.5 36.0 - 46.0 %   MCV 96.1 80.0 - 100.0 fL   MCH 31.9 26.0 - 34.0 pg   MCHC 33.3 30.0 - 36.0 g/dL   RDW 12.9 11.5 - 15.5 %   Platelets 283 150 - 400 K/uL   nRBC 0.0 0.0 - 0.2 %    Comment: Performed at [facility], [address] [place], [place] [address]  Urinalysis, Complete w Microscopic      Status: Abnormal   Collection Time: 07/03/19 12:50 PM  Result Value Ref Range   Color, Urine YELLOW (A) YELLOW   APPearance HAZY (A) CLEAR   Specific Gravity, Urine 1.017 1.005 - 1.030   pH 7.0 5.0 - 8.0   Glucose, UA NEGATIVE NEGATIVE mg/dL   Hgb urine dipstick SMALL (A) NEGATIVE   Bilirubin Urine NEGATIVE NEGATIVE   Ketones, ur NEGATIVE NEGATIVE mg/dL   Protein, ur NEGATIVE NEGATIVE mg/dL   Nitrite NEGATIVE NEGATIVE   Leukocytes,Ua MODERATE (A) NEGATIVE   RBC / HPF 6-10 0 - 5 RBC/hpf   WBC, UA 21-50 0 - 5 WBC/hpf   Bacteria, UA MANY (A) NONE SEEN   Squamous Epithelial / LPF 0-5 0 - 5   Mucus PRESENT     Comment: Performed at [facility], [address] [place], [place] [address]  SARS Coronavirus 2 (CEPHEID- Performed in [place] hospital lab), Hosp Order     Status: None   Collection Time: 07/03/19 12:50 PM   Specimen: Nasopharyngeal Swab  Result Value Ref Range   SARS Coronavirus 2 NEGATIVE NEGATIVE    Comment: (NOTE) If result is NEGATIVE SARS-CoV-2 target nucleic acids are NOT DETECTED. The SARS-CoV-2 RNA is generally detectable in upper and lower  respiratory specimens during the acute phase of infection. The lowest  concentration of SARS-CoV-2 viral copies this assay can detect is 250  copies / mL. A negative result does not preclude SARS-CoV-2 infection  and should not be used as the sole basis for treatment or other  patient management decisions.  A negative result may occur with  improper specimen collection / handling, submission of specimen other  than nasopharyngeal swab, presence of viral mutation(s) within the  areas targeted by this assay, and inadequate number of viral copies  (<250 copies / mL). A negative result must be combined with clinical  observations, patient history, and epidemiological information. If result is POSITIVE SARS-CoV-2 target nucleic acids are DETECTED. The SARS-CoV-2 RNA is generally detectable in upper and lower   respiratory specimens dur ing the acute phase of infection.  Positive  results are indicative of active infection with SARS-CoV-2.  Clinical  correlation with patient history and other diagnostic information is  necessary to determine patient infection status.  Positive results do  not rule out bacterial infection or co-infection with other  viruses. If result is PRESUMPTIVE POSTIVE SARS-CoV-2 nucleic acids MAY BE PRESENT.   A presumptive positive result was obtained on the submitted specimen  and confirmed on repeat testing.  While 2019 novel coronavirus  (SARS-CoV-2) nucleic acids may be present in the submitted sample  additional confirmatory testing may be necessary for epidemiological  and / or clinical management purposes  to differentiate between  SARS-CoV-2 and other Sarbecovirus currently known to infect humans.  If clinically indicated additional testing with an alternate test  methodology [phone]) is advised. The SARS-CoV-2 RNA is generally  detectable in upper and lower respiratory sp ecimens during the acute  phase of infection. The expected result is Negative. Fact Sheet for Patients:  StrictlyIdeas.no Fact Sheet for Healthcare Providers: BankingDealers.co.za This test is not yet approved or cleared by the [place] FDA and has been authorized for detection and/or diagnosis of SARS-CoV-2 by FDA under an Emergency Use Authorization (EUA).  This EUA will remain in effect (meaning this test can be used) for the duration of the COVID-19 declaration under Section 564(b)(1) of the Act, 21 U.S.C. section 360bbb-3(b)(1), unless the authorization is terminated or revoked sooner. Performed at [facility], [place], [place] [address]   Troponin I (High Sensitivity)     Status: None   Collection Time: 07/03/19  1:44 PM  Result Value Ref Range   Troponin I (High Sensitivity) 2 <18 ng/L    Comment:  (NOTE) Elevated high sensitivity troponin I (hsTnI) values and significant  changes across serial measurements may suggest ACS but many other  chronic and acute conditions are known to elevate hsTnI results.  Refer to the "Links" section for chest pain algorithms and additional  guidance. Performed at [facility], [address] [place], [place] [address]    Ct Head Wo Contrast  Result Date: 07/03/2019 CLINICAL DATA:  Syncopal episode this morning. EXAM: CT HEAD WITHOUT CONTRAST TECHNIQUE: Contiguous axial images were obtained from the base of the skull through the vertex without intravenous contrast. COMPARISON:  11/03/2009. FINDINGS: Brain: Stable minimally enlarged ventricles and subarachnoid spaces. Minimal patchy white matter low density in both cerebral hemispheres. No intracranial hemorrhage, mass lesion or CT evidence of acute infarction. Vascular: No hyperdense vessel or unexpected calcification. Skull: Normal. Negative for fracture or focal lesion. Sinuses/Orbits: Status post bilateral cataract extraction. Unremarkable bones and included paranasal sinuses. Other: None. IMPRESSION: No acute abnormality. Stable minimal diffuse cerebral and cerebellar atrophy and minimal chronic small vessel white matter ischemic changes in both cerebral hemispheres. Electronically Signed   By: Claudie Revering M.D.   On: 07/03/2019 12:54   Dg Chest Portable 1 View  Result Date: 07/03/2019 CLINICAL DATA:  Syncope today. EXAM: PORTABLE CHEST 1 VIEW COMPARISON:  03/28/2009 FINDINGS: The heart size and mediastinal contours are within normal limits. Both lungs are clear except for a chronic tiny calcified granuloma in the left midzone, unchanged. The visualized skeletal structures are unremarkable. IMPRESSION: No active disease.  Normal heart size. Electronically Signed   By: Lorriane Shire M.D.   On: 07/03/2019 13:44    Pending Labs Unresulted Labs (From admission, onward)    Start     Ordered    07/03/19 1510  Urine Culture  Add-on,   AD     07/03/19 1509          Vitals/Pain Today's Vitals   07/03/19 1330 07/03/19 1348 07/03/19 1350 07/03/19 1430  BP: 123/62 (!) 131/56 (!) 118/59 135/75  Pulse: 74  68  Resp:      Temp:      TempSrc:      SpO2: 98%   100%  Weight:      Height:      PainSc:        Isolation Precautions Airborne and Contact precautions  Medications Medications  cefTRIAXone (ROCEPHIN) 1 g in sodium chloride 0.9 % 100 mL IVPB (has no administration in time range)  sodium chloride 0.9 % bolus 500 mL (0 mLs Intravenous Stopped 07/03/19 1450)    Mobility walks Low fall risk         R Recommendations: See Admitting Provider Note  Report given to:   Additional Notes: pt ambulatory w/a steady gait.

## 2019-07-03 NOTE — ED Provider Notes (Signed)
[facility] Emergency Department Provider Note    First MD Initiated Contact with Patient 07/03/19 1224     (approximate)  I have reviewed the triage vital signs and the nursing notes.   HISTORY  Chief Complaint Loss of Consciousness    HPI Alexis James is a 68 y.o. female with the below listed past medical history presents the ER for syncopal episode.  States that she was otherwise feeling well this morning slept and a little bit more than she normally does was up getting breakfast and started feeling lightheaded.  She checked her blood pressure and was going to check her blood sugar but started feeling diaphoretic and lightheaded.  She then passed out falling and hitting her right forehead on the ground.  Did not have prolonged loss of consciousness.  Did feel that her heart was racing.  Denies any chest pain right now no nausea or vomiting.  Denies any history of diabetes but has been given a glucometer to make sure that her blood sugar was not dropping by her PCP.  Did have recent echo as well as Holter monitor which were both reassuring.  Denies any personal history of CAD.  Echo 2019: Notes recorded by Minna Merritts, MD on 10/28/2018 at 5:26 PM EST  Echocardiogram  Normal cardiac function, no significant valve disease  Normal heart pressures  Overall good study  Past Medical History:  Diagnosis Date   Anxiety    Cancer ([place])    skin   Diabetes mellitus without complication ([place])    Hyperlipidemia    Family History  Problem Relation Age of Onset   Colitis Mother 14   Mental retardation Father 35   Heart Problems Brother    Transient ischemic attack Sister    Cancer Neg Hx    Past Surgical History:  Procedure Laterality Date   BREAST SURGERY     breast reduction   CATARACT EXTRACTION W/PHACO Right 10/18/2017   Procedure: CATARACT EXTRACTION PHACO AND INTRAOCULAR LENS PLACEMENT ([place]);  Surgeon: Birder Robson, MD;   Location: [facility] ORS;  Service: Ophthalmology;  Laterality: Right;  [place] 00:27 AP% 19.7 CDE5.32 fluid pack lot # [id number]   CATARACT EXTRACTION W/PHACO Left 11/21/2018   Procedure: CATARACT EXTRACTION PHACO AND INTRAOCULAR LENS PLACEMENT (IOC);  Surgeon: Birder Robson, MD;  Location: [facility] ORS;  Service: Ophthalmology;  Laterality: Left;  [place] 00:31.3 CDE 5.38 Fluid Pack Lot # [id number]   CESAREAN SECTION     REDUCTION MAMMAPLASTY     smr     Patient Active Problem List   Diagnosis Date Noted   Syncope and collapse 05/02/2019   Palpitations 10/12/2018   PVC's (premature ventricular contractions) 10/12/2018   Bradycardia 10/12/2018   Bradycardia, sinus 10/07/2018   Localized macular rash 04/08/2018   Prediabetes 04/06/2018   Hearing loss secondary to cerumen impaction, unspecified laterality 04/04/2017   Menopausal flushing 03/26/2014   Vertigo 03/26/2014   Encounter for preventive health examination 02/16/2013   Anxiety    Hyperlipidemia    Screening for colon cancer 01/03/2012   Screening for cervical cancer 01/03/2012      Prior to Admission medications   Medication Sig Start Date End Date Taking? Authorizing Provider  aspirin EC 81 MG tablet Take 81 mg by mouth 2 (two) times a week. Tues and Thurs   Yes [provider]  Calcium Carb-Cholecalciferol (CALCIUM 600+D) 600-800 MG-UNIT TABS Take 1 tablet by mouth 2 (two) times daily.   Yes [provider]  Coenzyme Q10 (COQ-10) 100 MG CAPS Take 100 mg by mouth daily.   Yes [provider]  escitalopram (LEXAPRO) 5 MG tablet Take 1 tablet by mouth once daily Patient taking differently: Take 5 mg by mouth daily.  06/05/19  Yes Crecencio Mc, MD  lovastatin (MEVACOR) 40 MG tablet TAKE 1 TABLET BY MOUTH AT BEDTIME Patient taking differently: Take 40 mg by mouth at bedtime.  12/22/18  Yes Crecencio Mc, MD  acetaminophen (TYLENOL) 500 MG tablet Take 1,000 mg by mouth every 6 (six) hours as  needed for moderate pain.    [provider]  calcium carbonate (TUMS - DOSED IN MG ELEMENTAL CALCIUM) 500 MG chewable tablet Chew 2 tablets by mouth daily as needed for indigestion or heartburn.    [provider]  docusate sodium (COLACE) 100 MG capsule Take 100 mg by mouth daily as needed for mild constipation.    [provider]  doxylamine, Sleep, (UNISOM) 25 MG tablet Take 25 mg by mouth at bedtime as needed for sleep.    [provider]    Allergies Patient has no known allergies.    Social History Social History   Tobacco Use   Smoking status: Passive Smoke Exposure - Never Smoker   Smokeless tobacco: Never Used   Tobacco comment: Husband smoked x 38 years (quit 4 years ago)  Substance Use Topics   Alcohol use: Yes    Comment: Occasional   Drug use: No    Review of Systems Patient denies headaches, rhinorrhea, blurry vision, numbness, shortness of breath, chest pain, edema, cough, abdominal pain, nausea, vomiting, diarrhea, dysuria, fevers, rashes or hallucinations unless otherwise stated above in HPI. ____________________________________________   PHYSICAL EXAM:  VITAL SIGNS: Vitals:   07/03/19 1350 07/03/19 1430  BP: (!) 118/59 135/75  Pulse:  68  Resp:    Temp:    SpO2:  100%    Constitutional: Alert and oriented.  Eyes: Conjunctivae are normal.  Head contusion to right forehead. Linear superficial abrasion Nose: No congestion/rhinnorhea. Mouth/Throat: Mucous membranes are moist.   Neck: No stridor. Painless ROM.  Cardiovascular: Normal rate, regular rhythm. Grossly normal heart sounds.  Good peripheral circulation. Respiratory: Normal respiratory effort.  No retractions. Lungs CTAB. Gastrointestinal: Soft and nontender. No distention. No abdominal bruits. No CVA tenderness. Genitourinary:  Musculoskeletal: No lower extremity tenderness nor edema.  No joint effusions. Neurologic:  Normal speech and language. No  gross focal neurologic deficits are appreciated. No facial droop Skin:  Skin is warm, dry and intact. No rash noted. Psychiatric: Mood and affect are normal. Speech and behavior are normal.  ____________________________________________   LABS (all labs ordered are listed, but only abnormal results are displayed)  Results for orders placed or performed during the hospital encounter of 07/03/19 (from the past 24 hour(s))  Glucose, capillary     Status: Abnormal   Collection Time: 07/03/19 12:11 PM  Result Value Ref Range   Glucose-Capillary 112 (H) 70 - 99 mg/dL  Basic metabolic panel     Status: Abnormal   Collection Time: 07/03/19 12:12 PM  Result Value Ref Range   Sodium 139 135 - 145 mmol/L   Potassium 4.2 3.5 - 5.1 mmol/L   Chloride 103 98 - 111 mmol/L   CO2 28 22 - 32 mmol/L   Glucose, Bld 120 (H) 70 - 99 mg/dL   BUN 25 (H) 8 - 23 mg/dL   Creatinine, Ser 0.65 0.44 - 1.00 mg/dL   Calcium 9.5 8.9 -  10.3 mg/dL   GFR calc non Af Amer >60 >60 mL/min   GFR calc Af Amer >60 >60 mL/min   Anion gap 8 5 - 15  CBC     Status: None   Collection Time: 07/03/19 12:12 PM  Result Value Ref Range   WBC 6.9 4.0 - 10.5 K/uL   RBC 4.32 3.87 - 5.11 MIL/uL   Hemoglobin 13.8 12.0 - 15.0 g/dL   HCT 41.5 36.0 - 46.0 %   MCV 96.1 80.0 - 100.0 fL   MCH 31.9 26.0 - 34.0 pg   MCHC 33.3 30.0 - 36.0 g/dL   RDW 12.9 11.5 - 15.5 %   Platelets 283 150 - 400 K/uL   nRBC 0.0 0.0 - 0.2 %  Urinalysis, Complete w Microscopic     Status: Abnormal   Collection Time: 07/03/19 12:50 PM  Result Value Ref Range   Color, Urine YELLOW (A) YELLOW   APPearance HAZY (A) CLEAR   Specific Gravity, Urine 1.017 1.005 - 1.030   pH 7.0 5.0 - 8.0   Glucose, UA NEGATIVE NEGATIVE mg/dL   Hgb urine dipstick SMALL (A) NEGATIVE   Bilirubin Urine NEGATIVE NEGATIVE   Ketones, ur NEGATIVE NEGATIVE mg/dL   Protein, ur NEGATIVE NEGATIVE mg/dL   Nitrite NEGATIVE NEGATIVE   Leukocytes,Ua MODERATE (A) NEGATIVE   RBC / HPF 6-10  0 - 5 RBC/hpf   WBC, UA 21-50 0 - 5 WBC/hpf   Bacteria, UA MANY (A) NONE SEEN   Squamous Epithelial / LPF 0-5 0 - 5   Mucus PRESENT   SARS Coronavirus 2 (CEPHEID- Performed in [place] hospital lab), Hosp Order     Status: None   Collection Time: 07/03/19 12:50 PM   Specimen: Nasopharyngeal Swab  Result Value Ref Range   SARS Coronavirus 2 NEGATIVE NEGATIVE  Troponin I (High Sensitivity)     Status: None   Collection Time: 07/03/19  1:44 PM  Result Value Ref Range   Troponin I (High Sensitivity) 2 <18 ng/L   ____________________________________________  EKG My review and personal interpretation at Time: 11:57   Indication: syncope  Rate: 85  Rhythm: sinus Axis: normal Other: normal intervals, poor r wave progression, no stemi ____________________________________________  RADIOLOGY  I personally reviewed all radiographic images ordered to evaluate for the above acute complaints and reviewed radiology reports and findings.  These findings were personally discussed with the patient.  Please see medical record for radiology report.  ____________________________________________   PROCEDURES  Procedure(s) performed:  Procedures    Critical Care performed: no ____________________________________________   INITIAL IMPRESSION / ASSESSMENT AND PLAN / ED COURSE  Pertinent labs & imaging results that were available during my care of the patient were reviewed by me and considered in my medical decision making (see chart for details).   DDX: Hydration, dysrhythmia, electrolyte abnormality, UTI, cystitis, CVA, seizure, hypoglycemia  BIONCA MCKEY is a 69 y.o. who presents to the ED with syncopal episode and head injury as described above.  Patient currently well-appearing and in no acute distress.  Glucose is normal.  Possible hypoglycemic episode but not with any good explanation for why she would be hypoglycemic.  Seems less consistent with seizure.  Possible dysrhythmia given  her history of PVCs but also concerning for ACS given her history of palpitations and diaphoresis.  CT imaging is reassuring.  No evidence of electrolyte abnormality.  No hypoglycemia.  Troponin is negative and EKG does not show any ischemia.  Does have many bacteria  possible UTI causing weakness and fainting spell but I do believe given the patient's age and risk factors that she should the observed in the hospital for further medical management.     The patient was evaluated in Emergency Department today for the symptoms described in the history of present illness. He/she was evaluated in the context of the global COVID-19 pandemic, which necessitated consideration that the patient might be at risk for infection with the SARS-CoV-2 virus that causes COVID-19. Institutional protocols and algorithms that pertain to the evaluation of patients at risk for COVID-19 are in a state of rapid change based on information released by regulatory bodies including the CDC and federal and state organizations. These policies and algorithms were followed during the patient's care in the ED.  As part of my medical decision making, I reviewed the following data within the [facility] notes reviewed and incorporated, Labs reviewed, notes from prior ED visits and [place] Controlled Substance Database   ____________________________________________   FINAL CLINICAL IMPRESSION(S) / ED DIAGNOSES  Final diagnoses:  Syncope and collapse      NEW MEDICATIONS STARTED DURING THIS VISIT:  New Prescriptions   No medications on file     Note:  This document was prepared using Dragon voice recognition software and may include unintentional dictation errors.    Merlyn Lot, MD 07/03/19 [phone]

## 2019-07-03 NOTE — Care Management Obs Status (Signed)
[place] NOTIFICATION   Patient Details  Name: Alexis James MRN: [id number] Date of Birth: 09-Jul-1951   Medicare Observation Status Notification Given:  Yes    Marshell Garfinkel, RN 07/03/2019, 3:42 PM

## 2019-07-03 NOTE — ED Notes (Signed)
Pt ambulatory to toilet to void with a steady gait.

## 2019-07-03 NOTE — Telephone Encounter (Signed)
See result note. Reviewed results with patients spouse per release form and he states that she is currently in the ED due to another episode. Provided emotional support to him as I know this is frustrating. He was appreciative for the call and had no further questions at this time. Instructed him to please call if he should have any additional questions.

## 2019-07-04 ENCOUNTER — Encounter: Payer: Self-pay | Admitting: Radiology

## 2019-07-04 ENCOUNTER — Observation Stay (HOSPITAL_BASED_OUTPATIENT_CLINIC_OR_DEPARTMENT_OTHER): Payer: PPO

## 2019-07-04 ENCOUNTER — Observation Stay: Payer: PPO

## 2019-07-04 DIAGNOSIS — E785 Hyperlipidemia, unspecified: Secondary | ICD-10-CM | POA: Diagnosis not present

## 2019-07-04 DIAGNOSIS — N3090 Cystitis, unspecified without hematuria: Secondary | ICD-10-CM | POA: Diagnosis not present

## 2019-07-04 DIAGNOSIS — R911 Solitary pulmonary nodule: Secondary | ICD-10-CM | POA: Diagnosis not present

## 2019-07-04 DIAGNOSIS — F419 Anxiety disorder, unspecified: Secondary | ICD-10-CM | POA: Diagnosis not present

## 2019-07-04 DIAGNOSIS — R55 Syncope and collapse: Secondary | ICD-10-CM

## 2019-07-04 DIAGNOSIS — R918 Other nonspecific abnormal finding of lung field: Secondary | ICD-10-CM | POA: Diagnosis not present

## 2019-07-04 LAB — CBC
HCT: 37.8 % (ref 36.0–46.0)
Hemoglobin: 12.5 g/dL (ref 12.0–15.0)
MCH: 31.5 pg (ref 26.0–34.0)
MCHC: 33.1 g/dL (ref 30.0–36.0)
MCV: 95.2 fL (ref 80.0–100.0)
Platelets: 241 10*3/uL (ref 150–400)
RBC: 3.97 MIL/uL (ref 3.87–5.11)
RDW: 13 % (ref 11.5–15.5)
WBC: 3.8 10*3/uL — ABNORMAL LOW (ref 4.0–10.5)
nRBC: 0 % (ref 0.0–0.2)

## 2019-07-04 LAB — NM MYOCAR MULTI W/SPECT W/WALL MOTION / EF
Estimated workload: 1 METS
Exercise duration (min): 0 min
Exercise duration (sec): 0 s
LV dias vol: 38 mL (ref 46–106)
LV sys vol: 12 mL
MPHR: 151 {beats}/min
Peak HR: 113 {beats}/min
Percent HR: 74 %
Rest HR: 58 {beats}/min
TID: 1

## 2019-07-04 LAB — GLUCOSE, CAPILLARY
Glucose-Capillary: 69 mg/dL — ABNORMAL LOW (ref 70–99)
Glucose-Capillary: 136 mg/dL — ABNORMAL HIGH (ref 70–99)

## 2019-07-04 LAB — BASIC METABOLIC PANEL
Anion gap: 8 (ref 5–15)
BUN: 18 mg/dL (ref 8–23)
CO2: 25 mmol/L (ref 22–32)
Calcium: 8.8 mg/dL — ABNORMAL LOW (ref 8.9–10.3)
Chloride: 108 mmol/L (ref 98–111)
Creatinine, Ser: 0.77 mg/dL (ref 0.44–1.00)
GFR calc Af Amer: >60 mL/min (ref >60)
GFR calc non Af Amer: >60 mL/min (ref >60)
Glucose, Bld: 89 mg/dL (ref 70–99)
Potassium: 4.2 mmol/L (ref 3.5–5.1)
Sodium: 141 mmol/L (ref 135–145)

## 2019-07-04 LAB — ECHOCARDIOGRAM COMPLETE
Height: 62 in
Weight: 2147.2 oz

## 2019-07-04 MED ORDER — TECHNETIUM TC 99M TETROFOSMIN IV KIT
30.0000 | PACK | Freq: Once | INTRAVENOUS | Status: AC | PRN
  Administered 2019-07-04: 31.893 via INTRAVENOUS

## 2019-07-04 MED ORDER — TECHNETIUM TC 99M TETROFOSMIN IV KIT
10.0000 | PACK | Freq: Once | INTRAVENOUS | Status: AC | PRN
  Administered 2019-07-04: 11.51 via INTRAVENOUS

## 2019-07-04 MED ORDER — CEPHALEXIN 500 MG PO CAPS: 500.0000 mg | ORAL_CAPSULE | Freq: Three times a day (TID) | ORAL | 0 refills | Status: AC

## 2019-07-04 MED ORDER — REGADENOSON 0.4 MG/5ML IV SOLN
0.4000 mg | Freq: Once | INTRAVENOUS | Status: AC
  Administered 2019-07-04: 10:00:00 0.4 mg via INTRAVENOUS

## 2019-07-04 MED ORDER — DEXTROSE 50 % IV SOLN
INTRAVENOUS | Status: AC
  Administered 2019-07-04: 04:00:00 25 mL
  Filled 2019-07-04: qty 50

## 2019-07-04 MED ORDER — IOHEXOL 300 MG/ML  SOLN
75.0000 mL | Freq: Once | INTRAMUSCULAR | Status: AC | PRN
  Administered 2019-07-04: 75 mL via INTRAVENOUS

## 2019-07-04 NOTE — Progress Notes (Signed)
Progress Note  Patient Name: Alexis James Date of Encounter: 07/04/2019  Primary Cardiologist: Esmond Plants, MD PhD  Subjective   Feels well.  No lightheadedness, palpitations, chest pain, or shortness of breath.  Inpatient Medications    Scheduled Meds: . [START ON 07/05/2019] aspirin EC  81 mg Oral 2 times weekly  . calcium-vitamin D  1 tablet Oral BID  . enoxaparin (LOVENOX) injection  40 mg Subcutaneous Q24H  . escitalopram  5 mg Oral Daily  . pravastatin  20 mg Oral q1800  . sodium chloride flush  3 mL Intravenous Q12H  . sodium chloride flush  3 mL Intravenous Q12H   Continuous Infusions: . sodium chloride    . cefTRIAXone (ROCEPHIN)  IV     PRN Meds: sodium chloride, acetaminophen **OR** acetaminophen, doxylamine (Sleep), ondansetron **OR** ondansetron (ZOFRAN) IV, senna-docusate, sodium chloride flush   Vital Signs    Vitals:   07/03/19 2012 07/04/19 0353 07/04/19 0723 07/04/19 1112  BP: 123/64 131/71 134/63 111/61  Pulse: 71 72 62 78  Resp: 16 16 19 19   Temp: 98.1 F (36.7 C) 97.7 F (36.5 C) 98 F (36.7 C) 98.2 F (36.8 C)  TempSrc: Oral Oral Oral Oral  SpO2: 99% 100% 99% 98%  Weight:  61.1 kg    Height:        Intake/Output Summary (Last 24 hours) at 07/04/2019 1328 Last data filed at 07/04/2019 0400 Gross per 24 hour  Intake 603.99 ml  Output 1000 ml  Net -396.01 ml   Last 3 Weights 07/04/2019 07/03/2019 07/03/2019  Weight (lbs) 134 lb 12.8 oz 134 lb 3.2 oz 133 lb  Weight (kg) 61.145 kg 60.873 kg 60.328 kg      Telemetry    NSR - Personally Reviewed  ECG    No new tracing.  Physical Exam   GEN: No acute distress.   Neck: No JVD Cardiac: RRR, no murmurs, rubs, or gallops.  Respiratory: Clear to auscultation bilaterally. GI: Soft, nontender, non-distended  MS: No edema; No deformity. Neuro:  Nonfocal  Psych: Normal affect   Labs    High Sensitivity Troponin:   Recent Labs  Lab 07/03/19 1344 07/03/19 2223  TROPONINIHS 2 3       Cardiac EnzymesNo results for input(s): TROPONINI in the last 168 hours. No results for input(s): TROPIPOC in the last 168 hours.   Chemistry Recent Labs  Lab 07/03/19 1212 07/04/19 0551  NA 139 141  K 4.2 4.2  CL 103 108  CO2 28 25  GLUCOSE 120* 89  BUN 25* 18  CREATININE 0.65 0.77  CALCIUM 9.5 8.8*  GFRNONAA >60 >60  GFRAA >60 >60  ANIONGAP 8 8     Hematology Recent Labs  Lab 07/03/19 1212 07/04/19 0551  WBC 6.9 3.8*  RBC 4.32 3.97  HGB 13.8 12.5  HCT 41.5 37.8  MCV 96.1 95.2  MCH 31.9 31.5  MCHC 33.3 33.1  RDW 12.9 13.0  PLT 283 241    BNPNo results for input(s): BNP, PROBNP in the last 168 hours.   DDimer No results for input(s): DDIMER in the last 168 hours.   Radiology    Ct Head Wo Contrast  Result Date: 07/03/2019 CLINICAL DATA:  Syncopal episode this morning. EXAM: CT HEAD WITHOUT CONTRAST TECHNIQUE: Contiguous axial images were obtained from the base of the skull through the vertex without intravenous contrast. COMPARISON:  11/03/2009. FINDINGS: Brain: Stable minimally enlarged ventricles and subarachnoid spaces. Minimal patchy white matter low density in  both cerebral hemispheres. No intracranial hemorrhage, mass lesion or CT evidence of acute infarction. Vascular: No hyperdense vessel or unexpected calcification. Skull: Normal. Negative for fracture or focal lesion. Sinuses/Orbits: Status post bilateral cataract extraction. Unremarkable bones and included paranasal sinuses. Other: None. IMPRESSION: No acute abnormality. Stable minimal diffuse cerebral and cerebellar atrophy and minimal chronic small vessel white matter ischemic changes in both cerebral hemispheres. Electronically Signed   By: Claudie Revering M.D.   On: 07/03/2019 12:54   Nm Myocar Multi W/spect W/wall Motion / Ef  Addendum Date: 07/04/2019    Normal pharmacologic myocardial perfusion stress test without significant ischemia or scar.  The left ventricular ejection fraction is hyperdynamic  (>65%).  This is a low risk study.  There is no significant coronary artery calcification.  Attenuation correction CT is notable for a 1.3 cm nodule in the right middle lobe. Recommend non-emergent dedicated chest CT for further characterization.    Result Date: 07/04/2019  Normal pharmacologic myocardial perfusion stress test without significant ischemia or scar.  The left ventricular ejection fraction is hyperdynamic (>65%).  This is a low risk study.  There is no significant coronary artery calcification.  Attenuation correction CT is notable for a 1.3 cm nodule in the right middle lobe. Recommend non-emergent dedicated chest CT for further characterization.    Dg Chest Portable 1 View  Result Date: 07/03/2019 CLINICAL DATA:  Syncope today. EXAM: PORTABLE CHEST 1 VIEW COMPARISON:  03/28/2009 FINDINGS: The heart size and mediastinal contours are within normal limits. Both lungs are clear except for a chronic tiny calcified granuloma in the left midzone, unchanged. The visualized skeletal structures are unremarkable. IMPRESSION: No active disease.  Normal heart size. Electronically Signed   By: Lorriane Shire M.D.   On: 07/03/2019 13:44    Cardiac Studies   TTE (07/03/2019):  1. The left ventricle has normal systolic function with an ejection fraction of 60-65%. The cavity size was normal. There is mildly increased left ventricular wall thickness. Left ventricular diastolic Doppler parameters are consistent with impaired  relaxation. No evidence of left ventricular regional wall motion abnormalities.  2. The right ventricle has normal systolic function. The cavity was normal. There is no increase in right ventricular wall thickness. Right ventricular systolic pressure is normal with an estimated pressure of 28.0 mmHg.  3. The tricuspid valve is grossly normal. Tricuspid valve regurgitation is mild-moderate.  4. The aortic valve is tricuspid.  5. The aorta is normal in size and structure.   Pharmacologic MPI (07/04/2019):  Normal pharmacologic myocardial perfusion stress test without significant ischemia or scar.  The left ventricular ejection fraction is hyperdynamic (>65%).  This is a low risk study.  There is no significant coronary artery calcification.  Attenuation correction CT is notable for a 1.3 cm nodule in the right middle lobe. Recommend non-emergent dedicated chest CT for further characterization.  Patient Profile     68 y.o. female with a hx of prediabetes, hyperlipidemia, and anxiety who is being seen today for the evaluation of syncope.  Assessment & Plan    Syncope: No recurrence.  Tele unremarkable.  Stress test normal and echo without significant abnormalities to explain syncope.  I suspect vasovagal syncope exacerbated by dehydration and UTI are the underlying cause.  No further inpatient cardiac w/u recommended.  Patient advised to stay well-hydrated and to minimize caffeine and alcohol intake.  Cystitis: No symptoms.  Continued treatment per internal medicine.  Lung nodule: A 1.3 cm RML lung nodule was incidentally noted  on attenuation correction CT for today's stress test.  Will forward results to patient's PCP, Dr. Derrel Nip so that she or our office can arrange for dedicated chest CT shortly after discharge.  [facility] will sign off.   Medication Recommendations:  None Other recommendations (labs, testing, etc):  Dedicated chest CT in 1-2 weeks to evaluate RML lung nodule. Follow up as an outpatient:  1 month with Dr. Rockey Situ or APP.  For questions or updates, please contact [place] Please consult [email or web address] for contact info under [facility].    Signed, Nelva Bush, MD  07/04/2019, 1:28 PM

## 2019-07-04 NOTE — Discharge Instructions (Signed)
Syncope °Syncope is when you pass out (faint) for a short time. It is caused by a sudden decrease in blood flow to the brain. Signs that you may be about to pass out include: °· Feeling dizzy or light-headed. °· Feeling sick to your stomach (nauseous). °· Seeing all white or all black. °· Having cold, clammy skin. °If you pass out, get help right away. Call your local emergency services (911 in the [place] Do not drive yourself to the hospital. °Follow these instructions at home: °Watch for any changes in your symptoms. Take these actions to stay safe and help with your symptoms: °Lifestyle °· Do not drive, use machinery, or play sports until your doctor says it is okay. °· Do not drink alcohol. °· Do not use any products that contain nicotine or tobacco, such as cigarettes and e-cigarettes. If you need help quitting, ask your doctor. °· Drink enough fluid to keep your pee (urine) pale yellow. °General instructions °· Take over-the-counter and prescription medicines only as told by your doctor. °· If you are taking blood pressure or heart medicine, sit up and stand up slowly. Spend a few minutes getting ready to sit and then stand. This can help you feel less dizzy. °· Have someone stay with you until you feel stable. °· If you start to feel like you might pass out, lie down right away and raise (elevate) your feet above the level of your heart. Breathe deeply and steadily. Wait until all of the symptoms are gone. °· Keep all follow-up visits as told by your doctor. This is important. °Get help right away if: °· You have a very bad headache. °· You pass out once or more than once. °· You have pain in your chest, belly, or back. °· You have a very fast or uneven heartbeat (palpitations). °· It hurts to breathe. °· You are bleeding from your mouth or your bottom (rectum). °· You have black or tarry poop (stool). °· You have jerky movements that you cannot control (seizure). °· You are confused. °· You have trouble  walking. °· You are very weak. °· You have vision problems. °These symptoms may be an emergency. Do not wait to see if the symptoms will go away. Get medical help right away. Call your local emergency services (911 in the [place] Do not drive yourself to the hospital. °Summary °· Syncope is when you pass out (faint) for a short time. It is caused by a sudden decrease in blood flow to the brain. °· Signs that you may be about to faint include feeling dizzy, light-headed, or sick to your stomach, seeing all white or all black, or having cold, clammy skin. °· If you start to feel like you might pass out, lie down right away and raise (elevate) your feet above the level of your heart. Breathe deeply and steadily. Wait until all of the symptoms are gone. °This information is not intended to replace advice given to you by your health care provider. Make sure you discuss any questions you have with your health care provider. °Document Released: 05/17/2008 Document Revised: 01/11/2018 Document Reviewed: 01/11/2018 °Elsevier Patient Education © 2020 [employer] ° °

## 2019-07-04 NOTE — Discharge Summary (Signed)
[facility] [place] NAME: Alexis James    MR#:  [id number]  DATE OF BIRTH:  1951/11/16  DATE OF ADMISSION:  07/03/2019 ADMITTING PHYSICIAN: Saundra Shelling, MD  DATE OF DISCHARGE: No discharge date for patient encounter.  PRIMARY CARE PHYSICIAN: Crecencio Mc, MD    ADMISSION DIAGNOSIS:  Syncope and collapse [R55]  DISCHARGE DIAGNOSIS:  Active Problems:   Syncope and collapse   SECONDARY DIAGNOSIS:   Past Medical History:  Diagnosis Date  . Anxiety   . Cancer ([place])    skin  . Diabetes mellitus without complication ([place])   . Hyperlipidemia     HOSPITAL COURSE:   1.  Syncope.  The patient does get a prodrome of her syncope where she gets sweaty and clammy.  She tried to walk to her phone but ended up passing out.  Cardiology did an echocardiogram and stress test which were negative.  I advised that she must stay hydrated.  When she gets these symptoms she must sit down and lie down as quickly as possible. 2.  Acute cystitis.  Patient given Rocephin will give a few days of Keflex.  Follow-up urine culture as outpatient. 3.  Hyperlipidemia on statin 4.  Pulmonary nodule seen on stress test.  I confirmed this with CAT scan.  I will refer to Dr. Rogue Bussing as outpatient for consideration of repeat CT scan and/or PET scan  DISCHARGE CONDITIONS:  Satisfactory  CONSULTS OBTAINED:  Treatment Team:  Nelva Bush, MD  DRUG ALLERGIES:  No Known Allergies  DISCHARGE MEDICATIONS:   Allergies as of 07/04/2019   No Known Allergies     Medication List    TAKE these medications   acetaminophen 500 MG tablet Commonly known as: TYLENOL Take 1,000 mg by mouth every 6 (six) hours as needed for moderate pain.   aspirin EC 81 MG tablet Take 81 mg by mouth 2 (two) times a week. Tues and Thurs   Calcium 600+D 600-800 MG-UNIT Tabs Generic drug: Calcium Carb-Cholecalciferol Take 1 tablet by mouth 2 (two) times daily.   calcium  carbonate 500 MG chewable tablet Commonly known as: TUMS - dosed in mg elemental calcium Chew 2 tablets by mouth daily as needed for indigestion or heartburn.   cephALEXin 500 MG capsule Commonly known as: KEFLEX Take 1 capsule (500 mg total) by mouth 3 (three) times daily for 9 doses.   CoQ-10 100 MG Caps Take 100 mg by mouth daily.   docusate sodium 100 MG capsule Commonly known as: COLACE Take 100 mg by mouth daily as needed for mild constipation.   doxylamine (Sleep) 25 MG tablet Commonly known as: UNISOM Take 25 mg by mouth at bedtime as needed for sleep.   escitalopram 5 MG tablet Commonly known as: LEXAPRO Take 1 tablet by mouth once daily   lovastatin 40 MG tablet Commonly known as: MEVACOR TAKE 1 TABLET BY MOUTH AT BEDTIME        DISCHARGE INSTRUCTIONS:   Follow-up PMD 5 days  If you experience worsening of your admission symptoms, develop shortness of breath, life threatening emergency, suicidal or homicidal thoughts you must seek medical attention immediately by calling 911 or calling your MD immediately  if symptoms less severe.  You Must read complete instructions/literature along with all the possible adverse reactions/side effects for all the Medicines you take and that have been prescribed to you. Take any new Medicines after you have completely understood and accept all the possible adverse  reactions/side effects.   Please note  You were cared for by a hospitalist during your hospital stay. If you have any questions about your discharge medications or the care you received while you were in the hospital after you are discharged, you can call the unit and asked to speak with the hospitalist on call if the hospitalist that took care of you is not available. Once you are discharged, your primary care physician will handle any further medical issues. Please note that NO REFILLS for any discharge medications will be authorized once you are discharged, as it is  imperative that you return to your primary care physician (or establish a relationship with a primary care physician if you do not have one) for your aftercare needs so that they can reassess your need for medications and monitor your lab values.    Today   CHIEF COMPLAINT:   Chief Complaint  Patient presents with  . Loss of Consciousness    HISTORY OF PRESENT ILLNESS:  Alexis James  is a 68 y.o. female coming in with syncopal episode   VITAL SIGNS:  Blood pressure 111/61, pulse 78, temperature 98.2 F (36.8 C), temperature source Oral, resp. rate 19, height 5\' 2"  (1.575 m), weight 61.1 kg, SpO2 98 %.  I/O:    Intake/Output Summary (Last 24 hours) at 07/04/2019 1521 Last data filed at 07/04/2019 1346 Gross per 24 hour  Intake 843.99 ml  Output 1000 ml  Net -156.01 ml    PHYSICAL EXAMINATION:  GENERAL:  68 y.o.-year-old patient lying in the bed with no acute distress.  EYES: Pupils equal, round, reactive to light and accommodation. No scleral icterus. Extraocular muscles intact.  HEENT: Head atraumatic, normocephalic. Oropharynx and nasopharynx clear.  NECK:  Supple, no jugular venous distention. No thyroid enlargement, no tenderness.  LUNGS: Normal breath sounds bilaterally, no wheezing, rales,rhonchi or crepitation. No use of accessory muscles of respiration.  CARDIOVASCULAR: S1, S2 normal. No murmurs, rubs, or gallops.  ABDOMEN: Soft, non-tender, non-distended. Bowel sounds present. No organomegaly or mass.  EXTREMITIES: No pedal edema, cyanosis, or clubbing.  NEUROLOGIC: Cranial nerves II through XII are intact. Muscle strength 5/5 in all extremities. Sensation intact. Gait not checked.  PSYCHIATRIC: The patient is alert and oriented x 3.  SKIN: No obvious rash, lesion, or ulcer.   DATA REVIEW:   CBC Recent Labs  Lab 07/04/19 0551  WBC 3.8*  HGB 12.5  HCT 37.8  PLT 241    Chemistries  Recent Labs  Lab 07/04/19 0551  NA 141  K 4.2  CL 108  CO2 25   GLUCOSE 89  BUN 18  CREATININE 0.77  CALCIUM 8.8*    Microbiology Results  Results for orders placed or performed during the hospital encounter of 07/03/19  SARS Coronavirus 2 (CEPHEID- Performed in [facility] hospital lab), Hosp Order     Status: None   Collection Time: 07/03/19 12:50 PM   Specimen: Nasopharyngeal Swab  Result Value Ref Range Status   SARS Coronavirus 2 NEGATIVE NEGATIVE Final    Comment: (NOTE) If result is NEGATIVE SARS-CoV-2 target nucleic acids are NOT DETECTED. The SARS-CoV-2 RNA is generally detectable in upper and lower  respiratory specimens during the acute phase of infection. The lowest  concentration of SARS-CoV-2 viral copies this assay can detect is 250  copies / mL. A negative result does not preclude SARS-CoV-2 infection  and should not be used as the sole basis for treatment or other  patient management decisions.  A  negative result may occur with  improper specimen collection / handling, submission of specimen other  than nasopharyngeal swab, presence of viral mutation(s) within the  areas targeted by this assay, and inadequate number of viral copies  (<250 copies / mL). A negative result must be combined with clinical  observations, patient history, and epidemiological information. If result is POSITIVE SARS-CoV-2 target nucleic acids are DETECTED. The SARS-CoV-2 RNA is generally detectable in upper and lower  respiratory specimens dur ing the acute phase of infection.  Positive  results are indicative of active infection with SARS-CoV-2.  Clinical  correlation with patient history and other diagnostic information is  necessary to determine patient infection status.  Positive results do  not rule out bacterial infection or co-infection with other viruses. If result is PRESUMPTIVE POSTIVE SARS-CoV-2 nucleic acids MAY BE PRESENT.   A presumptive positive result was obtained on the submitted specimen  and confirmed on repeat testing.  While  2019 novel coronavirus  (SARS-CoV-2) nucleic acids may be present in the submitted sample  additional confirmatory testing may be necessary for epidemiological  and / or clinical management purposes  to differentiate between  SARS-CoV-2 and other Sarbecovirus currently known to infect humans.  If clinically indicated additional testing with an alternate test  methodology [phone]) is advised. The SARS-CoV-2 RNA is generally  detectable in upper and lower respiratory sp ecimens during the acute  phase of infection. The expected result is Negative. Fact Sheet for Patients:  StrictlyIdeas.no Fact Sheet for Healthcare Providers: BankingDealers.co.za This test is not yet approved or cleared by the [place] FDA and has been authorized for detection and/or diagnosis of SARS-CoV-2 by FDA under an Emergency Use Authorization (EUA).  This EUA will remain in effect (meaning this test can be used) for the duration of the COVID-19 declaration under Section 564(b)(1) of the Act, 21 U.S.C. section 360bbb-3(b)(1), unless the authorization is terminated or revoked sooner. Performed at [facility], [address] [place], [place] [address]     RADIOLOGY:  Ct Head Wo Contrast  Result Date: 07/03/2019 CLINICAL DATA:  Syncopal episode this morning. EXAM: CT HEAD WITHOUT CONTRAST TECHNIQUE: Contiguous axial images were obtained from the base of the skull through the vertex without intravenous contrast. COMPARISON:  11/03/2009. FINDINGS: Brain: Stable minimally enlarged ventricles and subarachnoid spaces. Minimal patchy white matter low density in both cerebral hemispheres. No intracranial hemorrhage, mass lesion or CT evidence of acute infarction. Vascular: No hyperdense vessel or unexpected calcification. Skull: Normal. Negative for fracture or focal lesion. Sinuses/Orbits: Status post bilateral cataract extraction. Unremarkable bones and  included paranasal sinuses. Other: None. IMPRESSION: No acute abnormality. Stable minimal diffuse cerebral and cerebellar atrophy and minimal chronic small vessel white matter ischemic changes in both cerebral hemispheres. Electronically Signed   By: Claudie Revering M.D.   On: 07/03/2019 12:54   Ct Chest W Contrast  Result Date: 07/04/2019 CLINICAL DATA:  Follow-up CT scan for lung nodule seen on nuclear medicine study earlier today. EXAM: CT CHEST WITH CONTRAST TECHNIQUE: Multidetector CT imaging of the chest was performed during intravenous contrast administration. CONTRAST:  21mL OMNIPAQUE IOHEXOL 300 MG/ML  SOLN COMPARISON:  Chest x-ray 07/03/2019 and nuclear medicine myocardial perfusion scan with SPECT earlier today. FINDINGS: Cardiovascular: Heart is normal size. Thoracic aorta is normal in caliber. Pulmonary arterial system is unremarkable. Remaining vascular structures are unremarkable. Mediastinum/Nodes: No mediastinal or hilar adenopathy. Remaining mediastinal structures double in for possible small hiatal hernia. Lungs/Pleura: Lungs are normally inflated without airspace  consolidation or effusion. Minimal linear scarring over the posterior left lower lobes. 9 mm (7 x 11 mm) nodule over the right middle lobe. 3 mm nodule over the posterior right upper lobe. No left lung nodules. Airways are normal. Upper Abdomen: Diverticulosis of the colon. Subcentimeter left liver hypodensity too small to characterize but likely a cyst. No acute findings. Musculoskeletal: Unremarkable IMPRESSION: 1. 9 mm (7 x 11 mm) nodule over the right middle lobe. 3 mm nodule over the posterior right upper lobe. Recommend follow-up chest CT 3 months versus PET-CT for further evaluation. 2.  Colonic diverticulosis. 3. Subcentimeter left liver hypodensity too small to characterize but likely a cyst. Electronically Signed   By: Marin Olp M.D.   On: 07/04/2019 14:56   Nm Myocar Multi W/spect W/wall Motion / Ef  Addendum Date:  07/04/2019    Normal pharmacologic myocardial perfusion stress test without significant ischemia or scar.  The left ventricular ejection fraction is hyperdynamic (>65%).  This is a low risk study.  There is no significant coronary artery calcification.  Attenuation correction CT is notable for a 1.3 cm nodule in the right middle lobe. Recommend non-emergent dedicated chest CT for further characterization.    Result Date: 07/04/2019  Normal pharmacologic myocardial perfusion stress test without significant ischemia or scar.  The left ventricular ejection fraction is hyperdynamic (>65%).  This is a low risk study.  There is no significant coronary artery calcification.  Attenuation correction CT is notable for a 1.3 cm nodule in the right middle lobe. Recommend non-emergent dedicated chest CT for further characterization.    Dg Chest Portable 1 View  Result Date: 07/03/2019 CLINICAL DATA:  Syncope today. EXAM: PORTABLE CHEST 1 VIEW COMPARISON:  03/28/2009 FINDINGS: The heart size and mediastinal contours are within normal limits. Both lungs are clear except for a chronic tiny calcified granuloma in the left midzone, unchanged. The visualized skeletal structures are unremarkable. IMPRESSION: No active disease.  Normal heart size. Electronically Signed   By: Lorriane Shire M.D.   On: 07/03/2019 13:44       Management plans discussed with the patient, and she is in agreement.  CODE STATUS:     Code Status Orders  (From admission, onward)         Start     Ordered   07/03/19 1832  Full code  Continuous     07/03/19 1831        Code Status History    This patient has a current code status but no historical code status.   Advance Care Planning Activity      TOTAL TIME TAKING CARE OF THIS PATIENT: 35 minutes.    Loletha Grayer M.D on 07/04/2019 at 3:21 PM  Between 7am to 6pm - Pager - [phone]  After 6pm go to [email or web address] - password [email or web address]  [facility]  [phone]  CC: Primary care physician; Crecencio Mc, MD

## 2019-07-04 NOTE — Progress Notes (Addendum)
PT BG 69. Asymptomatic. 25G D50 IV given x 1. BG rechecked: 136.

## 2019-07-04 NOTE — Plan of Care (Signed)
  Problem: Clinical Measurements: Goal: Ability to maintain clinical measurements within normal limits will improve Outcome: Progressing Goal: Will remain free from infection Outcome: Progressing Goal: Respiratory complications will improve Outcome: Progressing Goal: Cardiovascular complication will be avoided Outcome: Progressing   Problem: Elimination: Goal: Will not experience complications related to urinary retention Outcome: Progressing   Problem: Skin Integrity: Goal: Risk for impaired skin integrity will decrease Outcome: Progressing

## 2019-07-04 NOTE — Telephone Encounter (Signed)
Pt went to the ED and has been admitted.

## 2019-07-04 NOTE — Telephone Encounter (Signed)
Patient currently admitted to hospital.

## 2019-07-04 NOTE — Plan of Care (Signed)

## 2019-07-04 NOTE — Plan of Care (Signed)
Pt is ready for discharge home.   Problem: Education: Goal: Knowledge of General Education information will improve Description: Including pain rating scale, medication(s)/side effects and non-pharmacologic comfort measures 07/04/2019 1532 by Aubery Lapping, RN Outcome: Completed/Met 07/04/2019 1333 by Aubery Lapping, RN Outcome: Progressing   Problem: Health Behavior/Discharge Planning: Goal: Ability to manage health-related needs will improve 07/04/2019 1532 by Aubery Lapping, RN Outcome: Completed/Met 07/04/2019 1333 by Aubery Lapping, RN Outcome: Progressing   Problem: Clinical Measurements: Goal: Ability to maintain clinical measurements within normal limits will improve 07/04/2019 1532 by Aubery Lapping, RN Outcome: Completed/Met 07/04/2019 1333 by Aubery Lapping, RN Outcome: Progressing Goal: Will remain free from infection 07/04/2019 1532 by Aubery Lapping, RN Outcome: Completed/Met 07/04/2019 1333 by Aubery Lapping, RN Outcome: Progressing Goal: Diagnostic test results will improve 07/04/2019 1532 by Aubery Lapping, RN Outcome: Completed/Met 07/04/2019 1333 by Aubery Lapping, RN Outcome: Progressing Goal: Respiratory complications will improve 07/04/2019 1532 by Aubery Lapping, RN Outcome: Completed/Met 07/04/2019 1333 by Aubery Lapping, RN Outcome: Progressing Goal: Cardiovascular complication will be avoided 07/04/2019 1532 by Aubery Lapping, RN Outcome: Completed/Met 07/04/2019 1333 by Aubery Lapping, RN Outcome: Progressing   Problem: Activity: Goal: Risk for activity intolerance will decrease 07/04/2019 1532 by Aubery Lapping, RN Outcome: Completed/Met 07/04/2019 1333 by Aubery Lapping, RN Outcome: Progressing   Problem: Nutrition: Goal: Adequate nutrition will be maintained 07/04/2019 1532 by Aubery Lapping, RN Outcome: Completed/Met 07/04/2019 1333 by Aubery Lapping,  RN Outcome: Progressing   Problem: Coping: Goal: Level of anxiety will decrease 07/04/2019 1532 by Aubery Lapping, RN Outcome: Completed/Met 07/04/2019 1333 by Aubery Lapping, RN Outcome: Progressing   Problem: Elimination: Goal: Will not experience complications related to bowel motility 07/04/2019 1532 by Aubery Lapping, RN Outcome: Completed/Met 07/04/2019 1333 by Aubery Lapping, RN Outcome: Progressing Goal: Will not experience complications related to urinary retention 07/04/2019 1532 by Aubery Lapping, RN Outcome: Completed/Met 07/04/2019 1333 by Aubery Lapping, RN Outcome: Progressing   Problem: Pain Managment: Goal: General experience of comfort will improve 07/04/2019 1532 by Aubery Lapping, RN Outcome: Completed/Met 07/04/2019 1333 by Aubery Lapping, RN Outcome: Progressing   Problem: Safety: Goal: Ability to remain free from injury will improve 07/04/2019 1532 by Aubery Lapping, RN Outcome: Completed/Met 07/04/2019 1333 by Aubery Lapping, RN Outcome: Progressing   Problem: Skin Integrity: Goal: Risk for impaired skin integrity will decrease 07/04/2019 1532 by Aubery Lapping, RN Outcome: Completed/Met 07/04/2019 1333 by Aubery Lapping, RN Outcome: Progressing

## 2019-07-05 ENCOUNTER — Telehealth: Payer: Self-pay | Admitting: Internal Medicine

## 2019-07-05 LAB — HIV ANTIBODY (ROUTINE TESTING W REFLEX): HIV Screen 4th Generation wRfx: NONREACTIVE

## 2019-07-05 NOTE — Telephone Encounter (Signed)
Transition Care Management Follow-up Telephone Call  How have you been since you were released from the hospital? Patient she feels ok but she is scared concerning the nodule found on her lung. Patient very anxious," I feel nervous or anxious when I get up to walk not like I am going to faint but just hesitant." Patient scheduled 07/12/19 at 1:30 in office no symptoms of COVID at time of TCM call.   Do you understand why you were in the hospital? yes   Do you understand the discharge instrcutions? yes  Items Reviewed:  Medications reviewed: yes  Allergies reviewed: yes  Dietary changes reviewed: yes  Referrals reviewed: yes   Functional Questionnaire:   Activities of Daily Living (ADLs):   She states they are independent in the following: ambulation, bathing and hygiene, feeding, continence, grooming, toileting and dressing States they require assistance with the following: No assistance needed   Any transportation issues/concerns?: NO   Any patient concerns? Yes, she is scared about the nodule found on her lung.   Confirmed importance and date/time of follow-up visits scheduled: yes   Confirmed with patient if condition begins to worsen call PCP or go to the ER.  Patient was given the Call-a-Nurse line [phone]: yes

## 2019-07-06 LAB — URINE CULTURE: Culture: >=100000 — AB

## 2019-07-06 NOTE — Telephone Encounter (Signed)
Called and scheduled sooner appointment

## 2019-07-06 NOTE — Telephone Encounter (Signed)
Patient called in checking on status of follow up. Call back is [phone].

## 2019-07-10 ENCOUNTER — Encounter: Payer: Self-pay | Admitting: Internal Medicine

## 2019-07-10 ENCOUNTER — Ambulatory Visit (INDEPENDENT_AMBULATORY_CARE_PROVIDER_SITE_OTHER): Payer: PPO | Admitting: Internal Medicine

## 2019-07-10 DIAGNOSIS — I951 Orthostatic hypotension: Secondary | ICD-10-CM

## 2019-07-10 DIAGNOSIS — Z09 Encounter for follow-up examination after completed treatment for conditions other than malignant neoplasm: Secondary | ICD-10-CM | POA: Diagnosis not present

## 2019-07-10 DIAGNOSIS — R55 Syncope and collapse: Secondary | ICD-10-CM | POA: Diagnosis not present

## 2019-07-10 HISTORY — DX: Orthostatic hypotension: I95.1

## 2019-07-10 MED ORDER — FLUDROCORTISONE ACETATE 0.1 MG PO TABS: 0.1000 mg | ORAL_TABLET | Freq: Every day | ORAL | 2 refills | Status: DC

## 2019-07-10 NOTE — Patient Instructions (Signed)
I am starting you on Florinef to keep your blood pressure from dropping   Take it once daily,  morning or evening (you choose)  Continue to check blood pressure  LYING DOWN SITTING UP AND STANDING UP (IN THAT ORDER)   AND SEND ME READINGS IN 10 DAYS

## 2019-07-10 NOTE — Progress Notes (Signed)
Virtual Visit via [email or web address]  This visit type was conducted due to national recommendations for restrictions regarding the COVID-19 pandemic (e.g. social distancing).  This format is felt to be most appropriate for this patient at this time.  All issues noted in this document were discussed and addressed.  No physical exam was performed (except for noted visual exam findings with Video Visits).   I connected with@ on 07/10/19 at  1:30 PM EDT by a video enabled telemedicine application  and verified that I am speaking with the correct person using two identifiers. Location patient: home Location provider: work or home office Persons participating in the virtual visit: patient, provider  I discussed the limitations, risks, security and privacy concerns of performing an evaluation and management service by telephone and the availability of in person appointments. I also discussed with the patient that there may be a patient responsible charge related to this service. The patient expressed understanding and agreed to proceed.  Reason for visit: hospital follow up for syncopal episode   HPI:  2nd syncopal event occurred on  July 21 and was heralded by diaphoresis, out of body experience.  Admitted to [facility] and workup done .  Orthostatics not done for unclear reasons  .  Workup reviewed with patient in detail.  45 minutes spent with patient reviewing workup and details of events:     both occurred In the early morning  1) at 2 am after voiding ,  2) after having a sleepless night .. both occurrences occurred with warning .     She has checked orthostatics at home and positive  For a 25 pt drop   Has had low sugar episodes feels shaky,  Very hungry   Starting florinef   Needs referral to [facility] for lung nodules . Found on CT   Discussed multiple other findings on CT    ROS: See pertinent positives and negatives per HPI.  Past Medical History:  Diagnosis Date  . Anxiety   . Cancer ([place])    skin  . Diabetes mellitus without complication ([place])   . Hyperlipidemia     Past Surgical History:  Procedure Laterality Date  . BREAST SURGERY     breast reduction  . CATARACT EXTRACTION W/PHACO Right 10/18/2017   Procedure: CATARACT EXTRACTION PHACO AND INTRAOCULAR LENS PLACEMENT (IOC);  Surgeon: Birder Robson, MD;  Location: [facility] ORS;  Service: Ophthalmology;  Laterality: Right;  [place] 00:27 AP% 19.7 CDE5.32 fluid pack lot # [id number]  . CATARACT EXTRACTION W/PHACO Left 11/21/2018   Procedure: CATARACT EXTRACTION PHACO AND INTRAOCULAR LENS PLACEMENT (IOC);  Surgeon: Birder Robson, MD;  Location: [facility] ORS;  Service: Ophthalmology;  Laterality: Left;  [place] 00:31.3 CDE 5.38 Fluid Pack Lot # [id number]  . CESAREAN SECTION    . REDUCTION MAMMAPLASTY    . smr      Family History  Problem Relation Age of Onset  . Colitis Mother 38  . Mental retardation Father 50  . Heart Problems Brother   . Transient ischemic attack Sister   . Cancer Neg Hx     SOCIAL HX:  reports that she is a non-smoker but has been exposed to tobacco smoke. She has never used smokeless tobacco. She reports current alcohol use. She reports that she does not use drugs.   Current Outpatient Medications:  .  acetaminophen (TYLENOL) 500 MG tablet, Take 1,000 mg by mouth every 6 (six) hours as needed for moderate pain., Disp: , Rfl:  .  aspirin EC 81  MG tablet, Take 81 mg by mouth 2 (two) times a week. Tues and Thurs, Disp: , Rfl:  .  Calcium Carb-Cholecalciferol (CALCIUM 600+D) 600-800 MG-UNIT TABS, Take 1 tablet by mouth 2 (two) times daily., Disp: , Rfl:  .  calcium carbonate (TUMS - DOSED IN MG ELEMENTAL CALCIUM) 500 MG chewable tablet, Chew 2 tablets by mouth daily as needed for indigestion or heartburn., Disp: , Rfl:  .  Coenzyme Q10 (COQ-10) 100 MG CAPS, Take 100 mg by mouth daily., Disp: , Rfl:  .  docusate sodium (COLACE) 100 MG capsule, Take 100 mg by mouth daily as needed for mild constipation., Disp: ,  Rfl:  .  doxylamine, Sleep, (UNISOM) 25 MG tablet, Take 25 mg by mouth at bedtime as needed for sleep., Disp: , Rfl:  .  escitalopram (LEXAPRO) 5 MG tablet, Take 1 tablet by mouth once daily (Patient taking differently: Take 5 mg by mouth daily. ), Disp: 90 tablet, Rfl: 0 .  lovastatin (MEVACOR) 40 MG tablet, TAKE 1 TABLET BY MOUTH AT BEDTIME (Patient taking differently: Take 40 mg by mouth at bedtime. ), Disp: 90 tablet, Rfl: 1 .  fludrocortisone (FLORINEF) 0.1 MG tablet, Take 1 tablet (0.1 mg total) by mouth daily., Disp: 30 tablet, Rfl: 2  EXAM:  VITALS per patient if applicable:  GENERAL: alert, oriented, appears well and in no acute distress  HEENT: atraumatic, conjunttiva clear, no obvious abnormalities on inspection of external nose and ears  NECK: normal movements of the head and neck  LUNGS: on inspection no signs of respiratory distress, breathing rate appears normal, no obvious gross SOB, gasping or wheezing  CV: no obvious cyanosis  MS: moves all visible extremities without noticeable abnormality  PSYCH/NEURO: pleasant and cooperative, no obvious depression or anxiety, speech and thought processing grossly intact  ASSESSMENT AND PLAN:  Orthostatic hypotension Starting florinef for 25 pt drop in systolic and 2 prior syncopal episodes.  She will submit orthostatic readings in one week    Syncope and collapse Vasovagal etiology presumed given orthostasis .  Starting [facility] discharge follow-up Patient is stable post discharge and has no new issues or questions about her discharge plans    I discussed the assessment and treatment plan with the patient. The patient was provided an opportunity to ask questions and all were answered. The patient agreed with the plan and demonstrated an understanding of the instructions.   The patient was advised to call back or seek an in-person evaluation if the symptoms worsen or if the condition fails to improve as  anticipated.  I provided  40 minutes of non-face-to-face time during this encounter.   Crecencio Mc, MD

## 2019-07-10 NOTE — Assessment & Plan Note (Addendum)
Starting florinef for 25 pt drop in systolic and 2 prior syncopal episodes.  She will submit orthostatic readings in one week

## 2019-07-11 ENCOUNTER — Other Ambulatory Visit: Payer: Self-pay | Admitting: Internal Medicine

## 2019-07-11 ENCOUNTER — Other Ambulatory Visit: Payer: Self-pay

## 2019-07-11 DIAGNOSIS — Z1231 Encounter for screening mammogram for malignant neoplasm of breast: Secondary | ICD-10-CM

## 2019-07-11 DIAGNOSIS — Z09 Encounter for follow-up examination after completed treatment for conditions other than malignant neoplasm: Secondary | ICD-10-CM | POA: Insufficient documentation

## 2019-07-11 NOTE — Assessment & Plan Note (Signed)
Patient is stable post discharge and has no new issues or questions about her discharge plans. 

## 2019-07-11 NOTE — Assessment & Plan Note (Signed)
Vasovagal etiology presumed given orthostasis .  Starting florinef

## 2019-07-12 ENCOUNTER — Inpatient Hospital Stay: Payer: PPO | Admitting: Internal Medicine

## 2019-07-18 ENCOUNTER — Ambulatory Visit: Payer: PPO | Admitting: Internal Medicine

## 2019-07-20 DIAGNOSIS — I951 Orthostatic hypotension: Secondary | ICD-10-CM

## 2019-07-21 ENCOUNTER — Other Ambulatory Visit: Payer: Self-pay | Admitting: Internal Medicine

## 2019-07-21 DIAGNOSIS — R918 Other nonspecific abnormal finding of lung field: Secondary | ICD-10-CM

## 2019-07-21 NOTE — Progress Notes (Signed)
The referral to dr Rogue Bussing has been made.

## 2019-07-25 ENCOUNTER — Telehealth: Payer: Self-pay | Admitting: Internal Medicine

## 2019-07-25 NOTE — Telephone Encounter (Signed)
Pt dropped out off BP readings. Its in color folder up front. Thank you!

## 2019-07-26 NOTE — Telephone Encounter (Signed)
Has been placed in yellow results folder.

## 2019-07-30 MED ORDER — MIDODRINE HCL 2.5 MG PO TABS: 2.5000 mg | ORAL_TABLET | Freq: Three times a day (TID) | ORAL | 2 refills | Status: DC

## 2019-07-30 NOTE — Assessment & Plan Note (Signed)
No significant change with florinef.  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  Substitute trial of midodrine and refer to cardiology for further evaluation

## 2019-07-31 ENCOUNTER — Telehealth: Payer: Self-pay | Admitting: *Deleted

## 2019-07-31 ENCOUNTER — Ambulatory Visit (INDEPENDENT_AMBULATORY_CARE_PROVIDER_SITE_OTHER): Payer: PPO | Admitting: *Deleted

## 2019-07-31 ENCOUNTER — Other Ambulatory Visit: Payer: Self-pay

## 2019-07-31 VITALS — BP 138/72 | HR 74 | Resp 18

## 2019-07-31 DIAGNOSIS — I951 Orthostatic hypotension: Secondary | ICD-10-CM | POA: Diagnosis not present

## 2019-07-31 NOTE — Telephone Encounter (Signed)
Copied from [place] [id number]. Topic: Quick Communication - See Telephone Encounter >> Jul 31, 2019  8:12 AM Loma Boston wrote: CRM for notification. See Telephone encounter for: 07/31/19. Pt is wanting to drop by and have her BP checked. Her machine was reading high yesterday and this morning when she took it. But right now 143/70. Yesterday when sent result in [place] was saying to low and changed medication so now she has not taken anything and confused what to do. Pls FU and advise [phone]

## 2019-07-31 NOTE — Telephone Encounter (Signed)
Her old cuff may have been inaccurate and so I agree with stopping both meds and having orthostatic measurements of bp checked by RN  In office with new cuff and compared to our machine

## 2019-07-31 NOTE — Telephone Encounter (Signed)
Spoke with patient and reviewed recommendations with her. She was agreeable and they have changed medications several times. Now she is doing 7 days of orthostatics which she is to report to PCP. Scheduled her to come in next week to see CB and she was agreeable with this plan.

## 2019-07-31 NOTE — Progress Notes (Addendum)
Patient here for nurse visit BP check per order from 07/31/19. Phone note  Patient reports compliance with prescribed BP medications: yes held florinef since 07/30/19 , Patient has not picked up midodrine.  Last dose of BP medication: 07/30/19  BP Readings from Last 3 Encounters:  07/31/19 138/72  07/10/19 115/64  07/04/19 (!) 148/89   Pulse Readings from Last 3 Encounters:  07/31/19 74  07/10/19 85  07/04/19 92    Per Dr. Derrel Nip: Randa Evens Midodrine and florinef continue to monitor BP orthostatics for next week and send readings, new home cuff accurate, compared to nurse today home cuff reading 136/71 Pulse 73] Patient will send readings to office.  Patient verbalized understanding of instructions.   Kerin Salen, RN    I have reviewed the above information and agree with above.   Deborra Medina, MD

## 2019-07-31 NOTE — Telephone Encounter (Signed)
Notified patient of plan and will be here today at 2.

## 2019-07-31 NOTE — Telephone Encounter (Signed)
Patient has not picked up the Midodrine, and has stopped the florinef, BP yesterday was ranging from 146/72, P72 to 154/80,P80 with new cuff, orthostatics were Lying 140/72, P74 sitting 148/75,P 64standing 142/72, P 64 . To make sure new cuff correct advised patient to bring into office today at 2 and to hold midodrine unless BP drops less 100/60 before 2 today and she feels faint.  Iwould forward message to PCP to see if plan appropriate.

## 2019-07-31 NOTE — Telephone Encounter (Signed)
-----   Message from Minna Merritts, MD sent at 07/30/2019  9:48 PM EDT ----- Can we make sure patient has follow up after recent hospital stay Also need orthostatics. Would suggest she get some water in first thing in the Am Thx TG

## 2019-07-31 NOTE — Telephone Encounter (Signed)
Left voicemail message that Dr. Rockey Situ wanted her to come in for appointment from her recent hospital stay and that she needs to drink some water first thing in the morning. Requested that she please give [place] a call back to discuss these recommendations.

## 2019-08-02 ENCOUNTER — Other Ambulatory Visit: Payer: Self-pay

## 2019-08-03 ENCOUNTER — Inpatient Hospital Stay: Payer: PPO | Attending: Internal Medicine | Admitting: Internal Medicine

## 2019-08-03 ENCOUNTER — Encounter: Payer: Self-pay | Admitting: Internal Medicine

## 2019-08-03 ENCOUNTER — Other Ambulatory Visit: Payer: Self-pay

## 2019-08-03 DIAGNOSIS — R918 Other nonspecific abnormal finding of lung field: Secondary | ICD-10-CM | POA: Diagnosis not present

## 2019-08-03 DIAGNOSIS — Z7722 Contact with and (suspected) exposure to environmental tobacco smoke (acute) (chronic): Secondary | ICD-10-CM | POA: Diagnosis not present

## 2019-08-03 DIAGNOSIS — E785 Hyperlipidemia, unspecified: Secondary | ICD-10-CM | POA: Diagnosis not present

## 2019-08-03 DIAGNOSIS — E119 Type 2 diabetes mellitus without complications: Secondary | ICD-10-CM | POA: Diagnosis not present

## 2019-08-03 DIAGNOSIS — F419 Anxiety disorder, unspecified: Secondary | ICD-10-CM | POA: Diagnosis not present

## 2019-08-03 DIAGNOSIS — Z79899 Other long term (current) drug therapy: Secondary | ICD-10-CM | POA: Insufficient documentation

## 2019-08-03 DIAGNOSIS — Z7982 Long term (current) use of aspirin: Secondary | ICD-10-CM | POA: Diagnosis not present

## 2019-08-03 DIAGNOSIS — R55 Syncope and collapse: Secondary | ICD-10-CM | POA: Insufficient documentation

## 2019-08-03 DIAGNOSIS — R911 Solitary pulmonary nodule: Secondary | ICD-10-CM | POA: Diagnosis not present

## 2019-08-03 NOTE — Progress Notes (Signed)
[place] NOTE  Patient Care Team: Crecencio Mc, MD as PCP - General (Internal Medicine)  CHIEF COMPLAINTS/PURPOSE OF CONSULTATION: Lung nodules  # July 2020-right middle lobe ~1 cm lung nodule; right upper lobe 3 mm [incidental]  #Syncope [status post evaluation; Dr. Rockey Situ  Oncology History   No history exists.     HISTORY OF PRESENTING ILLNESS:  Alexis James 68 y.o.  female non-smoker has been referred to [place] for further evaluation recommendations for lung nodules noted incidentally on imaging.  Patient was recently admitted to hospital for syncope x2 of unclear etiology.  Patient had extensive cardiac evaluation which was negative as per patient.  Patient was started on midodrine and Florinef.  However, during the work-up patient was incidentally noted to have lung nodules for which she had a CT scan.  The CT scan chest confirmed the presence of lung nodules/as above.  Patient denies any unusual cough or shortness of breath or chest pain.  Denies any personal history of malignancies.  Review of Systems  Constitutional: Positive for malaise/fatigue. Negative for chills, diaphoresis, fever and weight loss.  HENT: Negative for nosebleeds and sore throat.   Eyes: Negative for double vision.  Respiratory: Negative for cough, hemoptysis, sputum production, shortness of breath and wheezing.   Cardiovascular: Negative for chest pain, palpitations, orthopnea and leg swelling.  Gastrointestinal: Negative for abdominal pain, blood in stool, constipation, diarrhea, heartburn, melena, nausea and vomiting.  Genitourinary: Negative for dysuria, frequency and urgency.  Musculoskeletal: Negative for back pain and joint pain.  Skin: Negative.  Negative for itching and rash.  Neurological: Positive for loss of consciousness. Negative for dizziness, tingling, focal weakness, weakness and headaches.  Endo/Heme/Allergies: Does not bruise/bleed easily.   Psychiatric/Behavioral: Negative for depression. The patient is not nervous/anxious and does not have insomnia.      MEDICAL HISTORY:  Past Medical History:  Diagnosis Date  . Anxiety   . Cancer ([place])    skin  . Diabetes mellitus without complication ([place])   . Hyperlipidemia     SURGICAL HISTORY: Past Surgical History:  Procedure Laterality Date  . BREAST SURGERY     breast reduction  . CATARACT EXTRACTION W/PHACO Right 10/18/2017   Procedure: CATARACT EXTRACTION PHACO AND INTRAOCULAR LENS PLACEMENT (IOC);  Surgeon: Birder Robson, MD;  Location: [facility] ORS;  Service: Ophthalmology;  Laterality: Right;  [place] 00:27 AP% 19.7 CDE5.32 fluid pack lot # [id number]  . CATARACT EXTRACTION W/PHACO Left 11/21/2018   Procedure: CATARACT EXTRACTION PHACO AND INTRAOCULAR LENS PLACEMENT (IOC);  Surgeon: Birder Robson, MD;  Location: [facility] ORS;  Service: Ophthalmology;  Laterality: Left;  [place] 00:31.3 CDE 5.38 Fluid Pack Lot # [id number]  . CESAREAN SECTION    . REDUCTION MAMMAPLASTY    . smr      SOCIAL HISTORY: Social History   Socioeconomic History  . Marital status: Married    Spouse name: Not on file  . Number of children: 3  . Years of education: Not on file  . Highest education level: Not on file  Occupational History  . Not on file  Social Needs  . Financial resource strain: Not on file  . Food insecurity    Worry: Not on file    Inability: Not on file  . Transportation needs    Medical: Not on file    Non-medical: Not on file  Tobacco Use  . Smoking status: Passive Smoke Exposure - Never Smoker  . Smokeless tobacco: Never Used  . Tobacco  comment: Husband smoked x 38 years (quit 4 years ago)  Substance and Sexual Activity  . Alcohol use: Yes    Comment: Occasional  . Drug use: No  . Sexual activity: Not on file  Lifestyle  . Physical activity    Days per week: Not on file    Minutes per session: Not on file  . Stress: Not on file  Relationships  . Social  Herbalist on phone: Not on file    Gets together: Not on file    Attends religious service: Not on file    Active member of club or organization: Not on file    Attends meetings of clubs or organizations: Not on file    Relationship status: Not on file  . Intimate partner violence    Fear of current or ex partner: Not on file    Emotionally abused: Not on file    Physically abused: Not on file    Forced sexual activity: Not on file  Other Topics Concern  . Not on file  Teague with husband; never smoked; ocassional wine; 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     FAMILY HISTORY: Family History  Problem Relation Age of Onset  . Colitis Mother 22  . Mental retardation Father 29  . Heart Problems Brother   . Transient ischemic attack Sister   . Cancer Neg Hx     ALLERGIES:  has No Known Allergies.  MEDICATIONS:  Current Outpatient Medications  Medication Sig Dispense Refill  . acetaminophen (TYLENOL) 500 MG tablet Take 1,000 mg by mouth every 6 (six) hours as needed for moderate pain.    Marland Kitchen aspirin EC 81 MG tablet Take 81 mg by mouth 2 (two) times a week. Tues and Thurs    . Calcium Carb-Cholecalciferol (CALCIUM 600+D) 600-800 MG-UNIT TABS Take 1 tablet by mouth 2 (two) times daily.    . calcium carbonate (TUMS - DOSED IN MG ELEMENTAL CALCIUM) 500 MG chewable tablet Chew 2 tablets by mouth daily as needed for indigestion or heartburn.    . Coenzyme Q10 (COQ-10) 100 MG CAPS Take 100 mg by mouth daily.    Marland Kitchen docusate sodium (COLACE) 100 MG capsule Take 100 mg by mouth daily as needed for mild constipation.    Marland Kitchen doxylamine, Sleep, (UNISOM) 25 MG tablet Take 25 mg by mouth at bedtime as needed for sleep.    Marland Kitchen escitalopram (LEXAPRO) 5 MG tablet Take 1 tablet by mouth once daily (Patient taking differently: Take 5 mg by mouth daily. ) 90 tablet 0  . lovastatin (MEVACOR) 40 MG tablet TAKE 1 TABLET BY MOUTH AT BEDTIME (Patient taking differently: Take 40  mg by mouth at bedtime. ) 90 tablet 1  . fludrocortisone (FLORINEF) 0.1 MG tablet Take 1 tablet (0.1 mg total) by mouth daily. (Patient not taking: Reported on 08/03/2019) 30 tablet 2  . midodrine (PROAMATINE) 2.5 MG tablet Take 1 tablet (2.5 mg total) by mouth 3 (three) times daily with meals. (Patient not taking: Reported on 08/03/2019) 90 tablet 2   No current facility-administered medications for this visit.       Marland Kitchen  PHYSICAL EXAMINATION: ECOG PERFORMANCE STATUS: 0 - Asymptomatic  Vitals:   08/03/19 1119  BP: (!) 151/69  Pulse: 71  Resp: 16  Temp: 98.7 F (37.1 C)   Filed Weights   08/03/19 1119  Weight: 136 lb (61.7 kg)    Physical Exam  Constitutional: She is oriented to person,  place, and time and well-developed, well-nourished, and in no distress.  HENT:  Head: Normocephalic and atraumatic.  Mouth/Throat: Oropharynx is clear and moist. No oropharyngeal exudate.  Eyes: Pupils are equal, round, and reactive to light.  Neck: Normal range of motion. Neck supple.  Cardiovascular: Normal rate and regular rhythm.  Pulmonary/Chest: No respiratory distress. She has no wheezes.  Abdominal: Soft. Bowel sounds are normal. She exhibits no distension and no mass. There is no abdominal tenderness. There is no rebound and no guarding.  Musculoskeletal: Normal range of motion.        General: No tenderness or edema.  Neurological: She is alert and oriented to person, place, and time.  Skin: Skin is warm.  Psychiatric: Affect normal.     LABORATORY DATA:  I have reviewed the data as listed Lab Results  Component Value Date   WBC 3.8 (L) 07/04/2019   HGB 12.5 07/04/2019   HCT 37.8 07/04/2019   MCV 95.2 07/04/2019   PLT 241 07/04/2019   Recent Labs    10/05/18 1012 05/02/19 1431 07/03/19 1212 07/04/19 0551  NA 139 142 139 141  K 4.0 4.0 4.2 4.2  CL 102 104 103 108  CO2 28 28 28 25   GLUCOSE 86 84 120* 89  BUN 19 17 25* 18  CREATININE 0.84 0.89 0.65 0.77  CALCIUM  9.9 9.2 9.5 8.8*  GFRNONAA  --  67 >60 >60  GFRAA  --  77 >60 >60  PROT 7.2 6.0  --   --   ALBUMIN 4.4 4.2  --   --   AST 23 25  --   --   ALT 20 20  --   --   ALKPHOS 55 60  --   --   BILITOT 0.7 <0.2  --   --     RADIOGRAPHIC STUDIES: I have personally reviewed the radiological images as listed and agreed with the findings in the report. No results found.  ASSESSMENT & PLAN:   Nodule of right lung #Incidental right middle lobe ~1 cm nodule; right upper lobe 3 mm nodule in a non-smoker.  The etiology of the nodules is unclear.  No personal history of malignancies.  Options include-PET scan versus repeating imaging in 3 months.  Will review at the tumor conference.  #Syncope-unclear etiology; question orthostatic on Florinef/midodrine.  #Cancer screening mammo- 07/2018; colo- 2012- cbc/cmp-WNL.   DISPOSITION: # follow up TBD-based on recommendations of the tumor conference.  # Thank you Dr.Tullo for allowing me to participate in the care of your pleasant patient. Please do not hesitate to contact me with questions or concerns in the interim.  # I reviewed the blood work- with the patient in detail; also reviewed the imaging independently [as summarized above]; and with the patient in detail.    Cc; Hayley   All questions were answered. The patient knows to call the clinic with any problems, questions or concerns.     Cammie Sickle, MD 08/05/2019 10:47 AM

## 2019-08-03 NOTE — Assessment & Plan Note (Addendum)
#  Incidental right middle lobe ~1 cm nodule; right upper lobe 3 mm nodule in a non-smoker.  The etiology of the nodules is unclear.  No personal history of malignancies.  Options include-PET scan versus repeating imaging in 3 months.  Will review at the tumor conference.  #Syncope-unclear etiology; question orthostatic on Florinef/midodrine.  #Cancer screening mammo- 07/2018; colo- 2012- cbc/cmp-WNL.   DISPOSITION: # follow up TBD-based on recommendations of the tumor conference.  # Thank you Dr.Tullo for allowing me to participate in the care of your pleasant patient. Please do not hesitate to contact me with questions or concerns in the interim.  # I reviewed the blood work- with the patient in detail; also reviewed the imaging independently [as summarized above]; and with the patient in detail.    Cc; Hayley

## 2019-08-06 ENCOUNTER — Ambulatory Visit: Payer: PPO | Admitting: Internal Medicine

## 2019-08-09 ENCOUNTER — Ambulatory Visit (INDEPENDENT_AMBULATORY_CARE_PROVIDER_SITE_OTHER): Payer: PPO | Admitting: Physician Assistant

## 2019-08-09 ENCOUNTER — Telehealth: Payer: Self-pay | Admitting: Internal Medicine

## 2019-08-09 ENCOUNTER — Other Ambulatory Visit: Payer: PPO

## 2019-08-09 ENCOUNTER — Encounter: Payer: Self-pay | Admitting: Physician Assistant

## 2019-08-09 ENCOUNTER — Other Ambulatory Visit: Payer: Self-pay

## 2019-08-09 VITALS — BP 137/79 | HR 89 | Ht 62.5 in | Wt 137.2 lb

## 2019-08-09 DIAGNOSIS — I951 Orthostatic hypotension: Secondary | ICD-10-CM | POA: Diagnosis not present

## 2019-08-09 DIAGNOSIS — E785 Hyperlipidemia, unspecified: Secondary | ICD-10-CM

## 2019-08-09 DIAGNOSIS — Z87898 Personal history of other specified conditions: Secondary | ICD-10-CM

## 2019-08-09 DIAGNOSIS — R911 Solitary pulmonary nodule: Secondary | ICD-10-CM

## 2019-08-09 NOTE — Patient Instructions (Signed)
Medication Instructions:  Your physician recommends that you continue on your current medications as directed. Please refer to the Current Medication list given to you today.  If you need a refill on your cardiac medications before your next appointment, please call your pharmacy.   Lab work: None ordered  If you have labs (blood work) drawn today and your tests are completely normal, you will receive your results only by: Marland Kitchen MyChart Message (if you have MyChart) OR . A paper copy in the mail If you have any lab test that is abnormal or we need to change your treatment, we will call you to review the results.  Testing/Procedures: None ordered   Follow-Up: At [facility], you and your health needs are our priority.  As part of our continuing mission to provide you with exceptional heart care, we have created designated Provider Care Teams.  These Care Teams include your primary Cardiologist (physician) and Advanced Practice Providers (APPs -  Physician Assistants and Nurse Practitioners) who all work together to provide you with the care you need, when you need it. You will need a follow up appointment in 6 months.  Please call our office 2 months in advance to schedule this appointment.  You may see Dr. Rockey Situ or Murray Hodgkins, NP.

## 2019-08-09 NOTE — Telephone Encounter (Signed)
I spoke to pt re: discussion at tumor conference.  Reviewed the options; PET scan now versus reimaging in 3 months.  We will proceed with PET scan at this time  C- Please schedule a PET scan in 1 appx- 1 week. ; follow-up with me 1 to 2 days after the PET scan.  Thx GB

## 2019-08-09 NOTE — Progress Notes (Signed)
Tumor Board Documentation  Alexis James was presented by Dr Rogue Bussing at our Tumor Board on 08/09/2019, which included representatives from medical oncology, radiation oncology, pathology, radiology, surgical, surgical oncology, navigation, internal medicine, pharmacy, genetics, pulmonology, palliative care, research.  Alexis James currently presents as a new patient, for discussion with history of the following treatments: active survellience.  Additionally, we reviewed previous medical and familial history, history of present illness, and recent lab results along with all available histopathologic and imaging studies. The tumor board considered available treatment options and made the following recommendations: Active surveillance PET vs 3 month repeat CT scan  The following procedures/referrals were also placed: No orders of the defined types were placed in this encounter.   Clinical Trial Status:     Staging used: Not Applicable  National site-specific guidelines   were discussed with respect to the case.  Tumor board is a meeting of clinicians from various specialty areas who evaluate and discuss patients for whom a multidisciplinary approach is being considered. Final determinations in the plan of care are those of the provider(s). The responsibility for follow up of recommendations given during tumor board is that of the provider.   Today's extended care, comprehensive team conference, Alexis James was not present for the discussion and was not examined.   Multidisciplinary Tumor Board is a multidisciplinary case peer review process.  Decisions discussed in the Multidisciplinary Tumor Board reflect the opinions of the specialists present at the conference without having examined the patient.  Ultimately, treatment and diagnostic decisions rest with the primary provider(s) and the patient.

## 2019-08-09 NOTE — Progress Notes (Addendum)
Office Visit    Patient Name: Alexis James Date of Encounter: 08/09/2019  Primary Care Provider:  Crecencio Mc, MD Primary Cardiologist:  Dr. Rockey Situ  Chief Complaint    68 year old female with history of prediabetes, hyperlipidemia, and anxiety who is being seen today for follow-up after [facility] admission 06/2023 for evaluation of syncope with noted suspicion for vasovagal syncope exacerbated by dehydration and UTI as underlying cause.  Past Medical History    Past Medical History:  Diagnosis Date  . Anxiety   . Cancer ([facility])    skin  . Diabetes mellitus without complication ([place])   . Hyperlipidemia    Past Surgical History:  Procedure Laterality Date  . BREAST SURGERY     breast reduction  . CATARACT EXTRACTION W/PHACO Right 10/18/2017   Procedure: CATARACT EXTRACTION PHACO AND INTRAOCULAR LENS PLACEMENT (IOC);  Surgeon: Birder Robson, MD;  Location: [facility] ORS;  Service: Ophthalmology;  Laterality: Right;  [place] 00:27 AP% 19.7 CDE5.32 fluid pack lot # [id number]  . CATARACT EXTRACTION W/PHACO Left 11/21/2018   Procedure: CATARACT EXTRACTION PHACO AND INTRAOCULAR LENS PLACEMENT (IOC);  Surgeon: Birder Robson, MD;  Location: [facility] ORS;  Service: Ophthalmology;  Laterality: Left;  [place] 00:31.3 CDE 5.38 Fluid Pack Lot # [id number]  . CESAREAN SECTION    . REDUCTION MAMMAPLASTY    . smr      Allergies  No Known Allergies  History of Present Illness    68 year old female seen today for follow-up after [facility] 06/2019 admission for syncopal episode. In May 2020, she had a similar syncopal episode as that of 06/2019. She underwent holter monitor without significant arrhythmia, showing PACs and PVCs, as well as brief NSVT/PSVT. Most recent 06/2019 workup without acute intercranial abnormalities, and cardiac workup unrevealing. 07/04/2019 stress test without significant ischemia or scar, LVEF hyperdynamic at more than 65%, no significant coronary artery calcification, and ruled a low  risk study. Of note, attenuation correction CT on stress test noted a 1.3 cm nodule in the right middle lobe with recommendation for nonemergent dedicated chest CT for further characterization.   Since that time, she has had follow-up for her pulmonary nodule and is currently anxiously awaiting the results.  She denies any further feelings of presyncope or any recurrence of syncopal events.  No chest pain, palpitations, shortness of breath, or dyspnea on exertion.  She continues to admit she does not drink much water.  She reported she typically drinks 2 cups of coffee each morning, at least 1 soda, and 1 to 2 glasses of water daily.  No recent racing HR, though she has noted her recorded HR are high.  She was seen by her PCP and started on fludrocortisone acetate and midodrine HCL for orthostatic hypotension with subsequent SBP 150-160s.  After she updated her PCP regarding her increase in blood pressure, she stopped these medications.  Subsequent blood pressure readings range from systolic blood pressure [id number], depending on time of day and activity.  She admits that she is not as active as she was before the coronavirus pandemic.  She occasionally is able to get out for a walk.  She does not have a regular exercise routine.  She admits that she does not eat as well as she knows she should, however she is aware of the changes that she should make to her diet.  On review of her daily meals, she does eat fairly well with a diet that includes chicken and vegetables. She is trying to cut back  on carbohydrates and fried foods. She reported she occasionally drinks 1-2 glasses of wine with friends (every other weekend) and does not hydrate well with this alcohol intake. She denied any past or current tobacco use. She also denied any signs of symptoms consistent with heart failure, including early satiety, abdominal distention, worsening dyspnea on exertion/shortness of breath, or orthopnea.  She did note occasional  lower extremity ankle/pedal edema, improved with elevation.  Orthostatics taken in clinic today showed increase in heart rate from laying to standing, consistent with mild  orthostatic hypotension, as well as dehydration.  Resting blood pressure 137/79 with heart rate 89 bpm.  EKG without acute changes and normal sinus rhythm.  Home Medications    Prior to Admission medications   Medication Sig Start Date End Date Taking? Authorizing Provider  acetaminophen (TYLENOL) 500 MG tablet Take 1,000 mg by mouth every 6 (six) hours as needed for moderate pain.   Yes [provider]  aspirin EC 81 MG tablet Take 81 mg by mouth 2 (two) times a week. Tues and Thurs   Yes [provider]  Calcium Carb-Cholecalciferol (CALCIUM 600+D) 600-800 MG-UNIT TABS Take 1 tablet by mouth 2 (two) times daily.   Yes [provider]  calcium carbonate (TUMS - DOSED IN MG ELEMENTAL CALCIUM) 500 MG chewable tablet Chew 2 tablets by mouth daily as needed for indigestion or heartburn.   Yes [provider]  Coenzyme Q10 (COQ-10) 100 MG CAPS Take 100 mg by mouth daily.   Yes [provider]  docusate sodium (COLACE) 100 MG capsule Take 100 mg by mouth daily as needed for mild constipation.   Yes [provider]  doxylamine, Sleep, (UNISOM) 25 MG tablet Take 25 mg by mouth at bedtime as needed for sleep.   Yes [provider]  escitalopram (LEXAPRO) 5 MG tablet Take 1 tablet by mouth once daily Patient taking differently: Take 5 mg by mouth daily.  06/05/19  Yes Crecencio Mc, MD  lovastatin (MEVACOR) 40 MG tablet TAKE 1 TABLET BY MOUTH AT BEDTIME Patient taking differently: Take 40 mg by mouth at bedtime.  12/22/18  Yes Crecencio Mc, MD  midodrine (PROAMATINE) 2.5 MG tablet Take 1 tablet (2.5 mg total) by mouth 3 (three) times daily with meals. Patient not taking: Reported on 08/03/2019 07/30/19   Crecencio Mc, MD    Review of Systems    She denies chest pain,  palpitations, dyspnea, pnd, orthopnea, n, v, dizziness, syncope, weight gain, or early satiety. .  All other systems reviewed and are otherwise negative except as noted above.  Physical Exam    VS:  BP 137/79 (BP Location: Right Arm, Patient Position: Sitting, Cuff Size: Normal)   Pulse 89   Ht 5' 2.5" (1.588 m)   Wt 137 lb 4 oz (62.3 kg)   BMI 24.70 kg/m  , BMI Body mass index is 24.7 kg/m. GEN: Well nourished, well developed, in no acute distress. HEENT: normal. Neck: Supple, no JVD, carotid bruits, or masses. Cardiac: RRR, no murmurs, rubs, or gallops. No clubbing, cyanosis.  Trace bilateral ankle edema. Radials/DP/PT 2+ and equal bilaterally.  Respiratory:  Respirations regular and unlabored, clear to auscultation bilaterally. GI: Soft, nontender, nondistended, BS + x 4. MS: no deformity or atrophy. Skin: warm and dry, no rash. Neuro:  Strength and sensation are intact. Psych: Normal affect.  Accessory Clinical Findings    ECG personally reviewed by me today - NSR, 89bpm, low voltage - no acute  changes.  Assessment & Plan    Orthostatic Hypotension, likely 2/2 dehydration History of Syncope --No recurrent presyncope or syncope with 06/2019 [facility] cardiac workup unrevealing with stress test normal and without signs of ischemia, EF >65%. She underwent holter monitor without significant arrhythmia, showing PACs and PVCs, as well as brief NSVT/PSVT. Etiology of most recent 06/2019 syncope thought likely due to dehydration with untreated UTI and possible vasovagal etiology. She continues to report low water intake with HR elevated and orthostatics today consistent with likely dehydration. No further UTI symptoms. Recommended she increase her water intake and limit caffeine and alcohol intake. Agree with stopping Florinef and Proamatine. Also recommended that she increase activity as tolerated. She should continue to monitor her BP at home. Follow-up in 6 months or sooner if needed.  HLD  --Continue statin   Arvil Chaco, PA-C 08/09/2019, 4:58 PM

## 2019-08-15 ENCOUNTER — Ambulatory Visit
Admission: RE | Admit: 2019-08-15 | Discharge: 2019-08-15 | Disposition: A | Discharge status: Home / Self Care | Payer: PPO | Source: Ambulatory Visit | Attending: Internal Medicine | Admitting: Internal Medicine

## 2019-08-15 DIAGNOSIS — Z79899 Other long term (current) drug therapy: Secondary | ICD-10-CM | POA: Insufficient documentation

## 2019-08-15 DIAGNOSIS — R911 Solitary pulmonary nodule: Secondary | ICD-10-CM | POA: Insufficient documentation

## 2019-08-15 DIAGNOSIS — Z1231 Encounter for screening mammogram for malignant neoplasm of breast: Secondary | ICD-10-CM | POA: Insufficient documentation

## 2019-08-16 ENCOUNTER — Ambulatory Visit
Admission: RE | Admit: 2019-08-16 | Discharge: 2019-08-16 | Disposition: A | Discharge status: Home / Self Care | Payer: PPO | Source: Ambulatory Visit | Attending: Internal Medicine | Admitting: Internal Medicine

## 2019-08-16 ENCOUNTER — Other Ambulatory Visit: Payer: Self-pay

## 2019-08-16 DIAGNOSIS — R911 Solitary pulmonary nodule: Secondary | ICD-10-CM

## 2019-08-16 LAB — GLUCOSE, CAPILLARY: Glucose-Capillary: 78 mg/dL (ref 70–99)

## 2019-08-16 MED ORDER — FLUDEOXYGLUCOSE F - 18 (FDG) INJECTION
7.7000 | Freq: Once | INTRAVENOUS | Status: AC | PRN
  Administered 2019-08-16: 12:00:00 7.61 via INTRAVENOUS

## 2019-08-17 ENCOUNTER — Inpatient Hospital Stay: Payer: PPO

## 2019-08-17 ENCOUNTER — Inpatient Hospital Stay: Payer: PPO | Attending: Internal Medicine | Admitting: Internal Medicine

## 2019-08-17 ENCOUNTER — Other Ambulatory Visit: Payer: Self-pay

## 2019-08-17 VITALS — BP 137/57 | HR 76 | Temp 97.7°F | Resp 20

## 2019-08-17 DIAGNOSIS — R918 Other nonspecific abnormal finding of lung field: Secondary | ICD-10-CM | POA: Insufficient documentation

## 2019-08-17 DIAGNOSIS — N838 Other noninflammatory disorders of ovary, fallopian tube and broad ligament: Secondary | ICD-10-CM

## 2019-08-17 DIAGNOSIS — R55 Syncope and collapse: Secondary | ICD-10-CM | POA: Insufficient documentation

## 2019-08-17 DIAGNOSIS — Z7982 Long term (current) use of aspirin: Secondary | ICD-10-CM | POA: Diagnosis not present

## 2019-08-17 DIAGNOSIS — Z79899 Other long term (current) drug therapy: Secondary | ICD-10-CM | POA: Diagnosis not present

## 2019-08-17 DIAGNOSIS — D398 Neoplasm of uncertain behavior of other specified female genital organs: Secondary | ICD-10-CM | POA: Insufficient documentation

## 2019-08-17 DIAGNOSIS — E119 Type 2 diabetes mellitus without complications: Secondary | ICD-10-CM | POA: Diagnosis not present

## 2019-08-17 DIAGNOSIS — E785 Hyperlipidemia, unspecified: Secondary | ICD-10-CM | POA: Diagnosis not present

## 2019-08-17 DIAGNOSIS — R911 Solitary pulmonary nodule: Secondary | ICD-10-CM

## 2019-08-17 DIAGNOSIS — R350 Frequency of micturition: Secondary | ICD-10-CM | POA: Diagnosis not present

## 2019-08-17 NOTE — Progress Notes (Signed)
[place] CONSULT NOTE  Patient Care Team: Crecencio Mc, MD as PCP - General (Internal Medicine) Rockey Situ Kathlene November, MD as PCP - Cardiology (Cardiology) Telford Nab, RN as Registered Nurse  CHIEF COMPLAINTS/PURPOSE OF CONSULTATION: Lung nodules  # July 2020-right middle lobe ~1 cm lung nodule; right upper lobe 3 mm [incidental]; SEP 3rd 2020- PET- NEG  # SEP 2020- [incidentalon PET]- LEFT adnexal mass [~4cm]; non-avid  #Syncope [status post evaluation; Dr. Rockey Situ  Oncology History   No history exists.     HISTORY OF PRESENTING ILLNESS:  Alexis James 68 y.o.  female non-smoker is here for follow-up/review the results of the PET scan ordered for lung nodules.  Patient denies any increased frequency of urination.  Denies any increased abdominal discomfort or distention.   No new shortness of breath or cough.  No further episodes of syncope.  Review of Systems  Constitutional: Positive for malaise/fatigue. Negative for chills, diaphoresis, fever and weight loss.  HENT: Negative for nosebleeds and sore throat.   Eyes: Negative for double vision.  Respiratory: Negative for cough, hemoptysis, sputum production, shortness of breath and wheezing.   Cardiovascular: Negative for chest pain, palpitations, orthopnea and leg swelling.  Gastrointestinal: Negative for abdominal pain, blood in stool, constipation, diarrhea, heartburn, melena, nausea and vomiting.  Genitourinary: Negative for dysuria, frequency and urgency.  Musculoskeletal: Negative for back pain and joint pain.  Skin: Negative.  Negative for itching and rash.  Neurological: Positive for loss of consciousness. Negative for dizziness, tingling, focal weakness, weakness and headaches.  Endo/Heme/Allergies: Does not bruise/bleed easily.  Psychiatric/Behavioral: Negative for depression. The patient is not nervous/anxious and does not have insomnia.      MEDICAL HISTORY:  Past Medical History:  Diagnosis  Date  . Anxiety   . Cancer ([place])    skin  . Diabetes mellitus without complication ([place])   . Hyperlipidemia     SURGICAL HISTORY: Past Surgical History:  Procedure Laterality Date  . BREAST SURGERY     breast reduction  . CATARACT EXTRACTION W/PHACO Right 10/18/2017   Procedure: CATARACT EXTRACTION PHACO AND INTRAOCULAR LENS PLACEMENT (IOC);  Surgeon: Birder Robson, MD;  Location: [facility] ORS;  Service: Ophthalmology;  Laterality: Right;  [place] 00:27 AP% 19.7 CDE5.32 fluid pack lot # [id number]  . CATARACT EXTRACTION W/PHACO Left 11/21/2018   Procedure: CATARACT EXTRACTION PHACO AND INTRAOCULAR LENS PLACEMENT (IOC);  Surgeon: Birder Robson, MD;  Location: [facility] ORS;  Service: Ophthalmology;  Laterality: Left;  [place] 00:31.3 CDE 5.38 Fluid Pack Lot # [id number]  . CESAREAN SECTION    . REDUCTION MAMMAPLASTY    . smr      SOCIAL HISTORY: Social History   Socioeconomic History  . Marital status: Married    Spouse name: Not on file  . Number of children: 3  . Years of education: Not on file  . Highest education level: Not on file  Occupational History  . Not on file  Social Needs  . Financial resource strain: Not on file  . Food insecurity    Worry: Not on file    Inability: Not on file  . Transportation needs    Medical: Not on file    Non-medical: Not on file  Tobacco Use  . Smoking status: Passive Smoke Exposure - Never Smoker  . Smokeless tobacco: Never Used  . Tobacco comment: Husband smoked x 38 years (quit 4 years ago)  Substance and Sexual Activity  . Alcohol use: Yes    Comment: Occasional  .  Drug use: No  . Sexual activity: Not on file  Lifestyle  . Physical activity    Days per week: Not on file    Minutes per session: Not on file  . Stress: Not on file  Relationships  . Social Herbalist on phone: Not on file    Gets together: Not on file    Attends religious service: Not on file    Active member of club or organization: Not on file     Attends meetings of clubs or organizations: Not on file    Relationship status: Not on file  . Intimate partner violence    Fear of current or ex partner: Not on file    Emotionally abused: Not on file    Physically abused: Not on file    Forced sexual activity: Not on file  Other Topics Concern  . Not on file  [place] with husband; never smoked; ocassional wine; 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     FAMILY HISTORY: Family History  Problem Relation Age of Onset  . Colitis Mother 52  . Mental retardation Father 24  . Heart Problems Brother   . Transient ischemic attack Sister   . Cancer Neg Hx   . Breast cancer Neg Hx     ALLERGIES:  has No Known Allergies.  MEDICATIONS:  Current Outpatient Medications  Medication Sig Dispense Refill  . acetaminophen (TYLENOL) 500 MG tablet Take 1,000 mg by mouth every 6 (six) hours as needed for moderate pain.    Marland Kitchen aspirin EC 81 MG tablet Take 81 mg by mouth 2 (two) times a week. Tues and Thurs    . Calcium Carb-Cholecalciferol (CALCIUM 600+D) 600-800 MG-UNIT TABS Take 1 tablet by mouth 2 (two) times daily.    . calcium carbonate (TUMS - DOSED IN MG ELEMENTAL CALCIUM) 500 MG chewable tablet Chew 2 tablets by mouth daily as needed for indigestion or heartburn.    . Coenzyme Q10 (COQ-10) 100 MG CAPS Take 100 mg by mouth daily.    Marland Kitchen docusate sodium (COLACE) 100 MG capsule Take 100 mg by mouth daily as needed for mild constipation.    Marland Kitchen doxylamine, Sleep, (UNISOM) 25 MG tablet Take 25 mg by mouth at bedtime as needed for sleep.    Marland Kitchen escitalopram (LEXAPRO) 5 MG tablet Take 1 tablet by mouth once daily (Patient taking differently: Take 5 mg by mouth daily. ) 90 tablet 0  . lovastatin (MEVACOR) 40 MG tablet TAKE 1 TABLET BY MOUTH AT BEDTIME (Patient taking differently: Take 40 mg by mouth at bedtime. ) 90 tablet 1  . midodrine (PROAMATINE) 2.5 MG tablet Take 1 tablet (2.5 mg total) by mouth 3 (three) times daily with meals.  (Patient not taking: Reported on 08/03/2019) 90 tablet 2   No current facility-administered medications for this visit.       Marland Kitchen  PHYSICAL EXAMINATION: ECOG PERFORMANCE STATUS: 0 - Asymptomatic  Vitals:   08/17/19 1100  BP: (!) 137/57  Pulse: 76  Resp: 20  Temp: 97.7 F (36.5 C)   There were no vitals filed for this visit.  Physical Exam  Constitutional: She is oriented to person, place, and time and well-developed, well-nourished, and in no distress.  HENT:  Head: Normocephalic and atraumatic.  Mouth/Throat: Oropharynx is clear and moist. No oropharyngeal exudate.  Eyes: Pupils are equal, round, and reactive to light.  Neck: Normal range of motion. Neck supple.  Cardiovascular: Normal rate and regular  rhythm.  Pulmonary/Chest: No respiratory distress. She has no wheezes.  Abdominal: Soft. Bowel sounds are normal. She exhibits no distension and no mass. There is no abdominal tenderness. There is no rebound and no guarding.  Musculoskeletal: Normal range of motion.        General: No tenderness or edema.  Neurological: She is alert and oriented to person, place, and time.  Skin: Skin is warm.  Psychiatric: Affect normal.     LABORATORY DATA:  I have reviewed the data as listed Lab Results  Component Value Date   WBC 3.8 (L) 07/04/2019   HGB 12.5 07/04/2019   HCT 37.8 07/04/2019   MCV 95.2 07/04/2019   PLT 241 07/04/2019   Recent Labs    10/05/18 1012 05/02/19 1431 07/03/19 1212 07/04/19 0551  NA 139 142 139 141  K 4.0 4.0 4.2 4.2  CL 102 104 103 108  CO2 28 28 28 25   GLUCOSE 86 84 120* 89  BUN 19 17 25* 18  CREATININE 0.84 0.89 0.65 0.77  CALCIUM 9.9 9.2 9.5 8.8*  GFRNONAA  --  67 >60 >60  GFRAA  --  77 >60 >60  PROT 7.2 6.0  --   --   ALBUMIN 4.4 4.2  --   --   AST 23 25  --   --   ALT 20 20  --   --   ALKPHOS 55 60  --   --   BILITOT 0.7 <0.2  --   --     RADIOGRAPHIC STUDIES: I have personally reviewed the radiological images as listed and  agreed with the findings in the report. Nm Pet Image Initial (pi) Skull Base To Thigh  Result Date: 08/16/2019 CLINICAL DATA:  Initial treatment strategy for right middle lobe 9 mm pulmonary nodule. EXAM: NUCLEAR MEDICINE PET SKULL BASE TO THIGH TECHNIQUE: 7.6 mCi F-18 FDG was injected intravenously. Full-ring PET imaging was performed from the skull base to thigh after the radiotracer. CT data was obtained and used for attenuation correction and anatomic localization. Fasting blood glucose: 78 mg/dl COMPARISON:  Chest CT 07/04/2019 FINDINGS: Mediastinal blood pool activity: SUV max 2.2 Liver activity: SUV max NA NECK: No areas of abnormal hypermetabolism. Incidental CT findings: No cervical adenopathy. CHEST: No thoracic nodal hypermetabolism. The right middle lobe pulmonary nodule on the prior CT is either less well-defined today or less distinct secondary to thicker slice collimation and nondedicated technique. Maximally 7 mm on image 96/3. Without correlate hypermetabolism. Incidental CT findings: 3 mm posterior right upper lobe pulmonary nodule on 62/3 is unchanged and below PET resolution. Mild subsegmental atelectasis at both lung bases. Tiny hiatal hernia. Mild cardiomegaly. ABDOMEN/PELVIS: No abdominopelvic parenchymal or nodal hypermetabolism. Incidental CT findings: Normal adrenal glands. Pelvic floor laxity. A left ovarian/adnexal fluid density lesion measures 4.3 x 3.5 cm on 202/3. SKELETON: No acute osseous abnormality. Incidental CT findings: Mild osteopenia. IMPRESSION: 1. The right middle lobe pulmonary nodule may have decreased in size today or be less conspicuous than on the prior secondary to differences in technique. It is not hypermetabolic, but is at the low end of resolution by PET. Therefore, recommend chest CT follow-up at 3-6 months. 2. Left ovarian/adnexal fluid density lesion, which per consensus criteria warrants further evaluation with ultrasound. This recommendation follows ACR  consensus guidelines: White Paper of the ACR Incidental Findings Committee II on Adnexal Findings. J Am Coll Radiol [phone]. Electronically Signed   By: Abigail Miyamoto M.D.   On: 08/16/2019 15:37  Mm 3d Screen Breast Bilateral  Result Date: 08/15/2019 CLINICAL DATA:  Screening. EXAM: DIGITAL SCREENING BILATERAL MAMMOGRAM WITH TOMO AND CAD COMPARISON:  Previous exam(s). ACR Breast Density Category b: There are scattered areas of fibroglandular density. FINDINGS: There are no findings suspicious for malignancy. Images were processed with CAD. IMPRESSION: No mammographic evidence of malignancy. A result letter of this screening mammogram will be mailed directly to the patient. RECOMMENDATION: Screening mammogram in one year. (Code:[id number]) BI-RADS CATEGORY  1: Negative. Electronically Signed   By: Franki Cabot M.D.   On: 08/15/2019 14:58    ASSESSMENT & PLAN:   Nodule of right lung # Incidental July 2020 CT right middle lobe ~1 cm nodule; right upper lobe 3 mm nodule in a non-smoker.  August 16, 2019 PET scan shows slightly decreased/and 7 mm right middle lobe lung nodule/3 mm right upper lobe lung nodule-non-PET avid; incidental left adnexal mass [see below]  #With regards to lung nodules-appear to be benign based on PET scan.  However recommend a follow-up CT scan in 4 months.  Also discussed the tumor conference.  #Left adnexal mass-approximately 4 cm in size; not PET avid.  Check Ca1 2 5; also check ultrasound of the pelvis.  Will call with results.  #Syncope-unclear etiology; question orthostatic on Florinef/midodrine.  Stable.   DISPOSITION: # lab- today- ca-125 # pelvic US in 1 week.- will with results.  # follow up in 4 months- MD; CT chest prior-Dr.B    All questions were answered. The patient knows to call the clinic with any problems, questions or concerns.     Cammie Sickle, MD 08/17/2019 12:32 PM

## 2019-08-17 NOTE — Assessment & Plan Note (Addendum)
#   Incidental July 2020 CT right middle lobe ~1 cm nodule; right upper lobe 3 mm nodule in a non-smoker.  August 16, 2019 PET scan shows slightly decreased/and 7 mm right middle lobe lung nodule/3 mm right upper lobe lung nodule-non-PET avid; incidental left adnexal mass [see below]  #With regards to lung nodules-appear to be benign based on PET scan.  However recommend a follow-up CT scan in 4 months.  Also discussed the tumor conference.  #Left adnexal mass-approximately 4 cm in size; not PET avid.  Check Ca1 2 5; also check ultrasound of the pelvis.  Will call with results.  #Syncope-unclear etiology; question orthostatic on Florinef/midodrine.  Stable.   DISPOSITION: # lab- today- ca-125 # pelvic US in 1 week.- will with results.  # follow up in 4 months- MD; CT chest prior-Dr.B

## 2019-08-18 LAB — CA 125: Cancer Antigen (CA) 125: 12.9 U/mL (ref 0.0–38.1)

## 2019-08-24 ENCOUNTER — Ambulatory Visit
Admission: RE | Admit: 2019-08-24 | Discharge: 2019-08-24 | Disposition: A | Discharge status: Home / Self Care | Payer: PPO | Source: Ambulatory Visit | Attending: Internal Medicine | Admitting: Internal Medicine

## 2019-08-24 ENCOUNTER — Telehealth: Payer: Self-pay | Admitting: Internal Medicine

## 2019-08-24 ENCOUNTER — Other Ambulatory Visit: Payer: Self-pay

## 2019-08-24 DIAGNOSIS — N838 Other noninflammatory disorders of ovary, fallopian tube and broad ligament: Secondary | ICD-10-CM | POA: Diagnosis not present

## 2019-08-24 DIAGNOSIS — N83202 Unspecified ovarian cyst, left side: Secondary | ICD-10-CM | POA: Diagnosis not present

## 2019-08-24 NOTE — Telephone Encounter (Signed)
°  Relation to [id number]  Call back number:[phone]   Reason for call:  Patient states she would like to discuss a few specialist appointments, test results, BP readings she dropped off at the office, patient was unsure if PCP was aware, patient didn't want to elaborate, please advise

## 2019-08-28 NOTE — Telephone Encounter (Signed)
Pt is scheduled for 08/31/2019 to discuss.

## 2019-08-31 ENCOUNTER — Encounter: Payer: Self-pay | Admitting: Internal Medicine

## 2019-08-31 ENCOUNTER — Other Ambulatory Visit: Payer: Self-pay

## 2019-08-31 ENCOUNTER — Ambulatory Visit (INDEPENDENT_AMBULATORY_CARE_PROVIDER_SITE_OTHER): Payer: PPO | Admitting: Internal Medicine

## 2019-08-31 VITALS — Ht 62.5 in | Wt 137.0 lb

## 2019-08-31 DIAGNOSIS — R911 Solitary pulmonary nodule: Secondary | ICD-10-CM | POA: Diagnosis not present

## 2019-08-31 DIAGNOSIS — N83201 Unspecified ovarian cyst, right side: Secondary | ICD-10-CM

## 2019-08-31 DIAGNOSIS — R7303 Prediabetes: Secondary | ICD-10-CM | POA: Diagnosis not present

## 2019-08-31 DIAGNOSIS — M8580 Other specified disorders of bone density and structure, unspecified site: Secondary | ICD-10-CM | POA: Diagnosis not present

## 2019-08-31 DIAGNOSIS — Z780 Asymptomatic menopausal state: Secondary | ICD-10-CM | POA: Diagnosis not present

## 2019-08-31 DIAGNOSIS — E7800 Pure hypercholesterolemia, unspecified: Secondary | ICD-10-CM | POA: Diagnosis not present

## 2019-08-31 DIAGNOSIS — R55 Syncope and collapse: Secondary | ICD-10-CM | POA: Diagnosis not present

## 2019-08-31 DIAGNOSIS — H43813 Vitreous degeneration, bilateral: Secondary | ICD-10-CM | POA: Diagnosis not present

## 2019-08-31 LAB — HM DIABETES EYE EXAM

## 2019-08-31 MED ORDER — ESCITALOPRAM OXALATE 5 MG PO TABS: 5.0000 mg | ORAL_TABLET | Freq: Every day | ORAL | 0 refills | Status: DC

## 2019-08-31 MED ORDER — LOVASTATIN 40 MG PO TABS: 40.0000 mg | ORAL_TABLET | Freq: Every day | ORAL | 1 refills | Status: DC

## 2019-08-31 NOTE — Patient Instructions (Signed)
  To make a low carb chip :  Take the Joseph's Lavash or Pita bread,  Or the [place] Low carb whole wheat tortilla   Place on metal cookie sheet  Brush with olive oil  Sprinkle garlic powder (NOT garlic salt), grated parmesan cheese, mediterranean seasoning , or all of them?  Bake at 275 for 30 minutes   We have substitutions for your potatoes!!  Try the mashed cauliflower and riced cauliflower dishes instead of rice and mashed potatoes  Mashed turnips are also very low carb!   For desserts :  Try the Dannon Lt n Fit greek yogurt dessert flavors and top with reddi Whip .  8 carbs,  80 calories  Try Oikos Triple Zero Greek Yogurt in the salted caramel, and the coffee flavors  With Whipped Cream for dessert  breyer's low carb ice cream, available in bars (on a stick, better ) or scoopable ice cream  HERE ARE THE LOW CARB  BREAD CHOICES        

## 2019-08-31 NOTE — Progress Notes (Signed)
Virtual Visit via [email or web address]  This visit type was conducted due to national recommendations for restrictions regarding the COVID-19 pandemic (e.g. social distancing).  This format is felt to be most appropriate for this patient at this time.  All issues noted in this document were discussed and addressed.  No physical exam was performed (except for noted visual exam findings with Video Visits).   I connected with@ on 08/31/19 at 10:00 AM EDT by a video enabled telemedicine application or telephone and verified that I am speaking with the correct person using two identifiers. Location patient: home Location provider: work or home office Persons participating in the virtual visit: patient, provider  I discussed the limitations, risks, security and privacy concerns of performing an evaluation and management service by telephone and the availability of in person appointments. I also discussed with the patient that there may be a patient responsible charge related to this service. The patient expressed understanding and agreed to proceed.  Reason for visit: follow up   HPI:  68 yr old female with recent recurrent syncope /postural dizziness secondary to orthostatic hypotension presents for follow p discussion of the results  of  A PET SCAN that was ordered by Dr Rogue Bussing  during workup of right lung nodule. Her last appt with heme onc was sept r  A n indicental right ovarian cyst was identified  And advised to work up  Lung nodules considered benign ,  Follow up ct 4 months per tumor board   Pelvic US done  Benign characteristics of 4.7 cm cyst , CA 125 was normal   Patient concerned about appearance of heart on PET sca reviewd July 2020 ECHO:  Mild LVH  Ef 60 to 65%   No longer taking promaatine ,  History of hypertensio remote, then hypotension . bp has been labile ,    Had an  episode of shakiness .  Prediabetes discussed diet in detai    ROS: See pertinent positives and negatives per  HPI.  Past Medical History:  Diagnosis Date  . Anxiety   . Cancer ([place])    skin  . Diabetes mellitus without complication ([place])   . Hyperlipidemia     Past Surgical History:  Procedure Laterality Date  . BREAST SURGERY     breast reduction  . CATARACT EXTRACTION W/PHACO Right 10/18/2017   Procedure: CATARACT EXTRACTION PHACO AND INTRAOCULAR LENS PLACEMENT (IOC);  Surgeon: Birder Robson, MD;  Location: [facility] ORS;  Service: Ophthalmology;  Laterality: Right;  [place] 00:27 AP% 19.7 CDE5.32 fluid pack lot # [id number]  . CATARACT EXTRACTION W/PHACO Left 11/21/2018   Procedure: CATARACT EXTRACTION PHACO AND INTRAOCULAR LENS PLACEMENT (IOC);  Surgeon: Birder Robson, MD;  Location: [facility] ORS;  Service: Ophthalmology;  Laterality: Left;  [place] 00:31.3 CDE 5.38 Fluid Pack Lot # [id number]  . CESAREAN SECTION    . REDUCTION MAMMAPLASTY    . smr      Family History  Problem Relation Age of Onset  . Colitis Mother 46  . Mental retardation Father 25  . Heart Problems Brother   . Transient ischemic attack Sister   . Cancer Neg Hx   . Breast cancer Neg Hx     SOCIAL HX:  reports that she is a non-smoker but has been exposed to tobacco smoke. She has never used smokeless tobacco. She reports current alcohol use. She reports that she does not use drugs.   Current Outpatient Medications:  .  acetaminophen (TYLENOL) 500 MG tablet, Take 1,000 mg  by mouth every 6 (six) hours as needed for moderate pain., Disp: , Rfl:  .  aspirin EC 81 MG tablet, Take 81 mg by mouth 2 (two) times a week. Tues and Thurs, Disp: , Rfl:  .  Calcium Carb-Cholecalciferol (CALCIUM 600+D) 600-800 MG-UNIT TABS, Take 1 tablet by mouth 2 (two) times daily., Disp: , Rfl:  .  calcium carbonate (TUMS - DOSED IN MG ELEMENTAL CALCIUM) 500 MG chewable tablet, Chew 2 tablets by mouth daily as needed for indigestion or heartburn., Disp: , Rfl:  .  Coenzyme Q10 (COQ-10) 100 MG CAPS, Take 100 mg by mouth daily., Disp: , Rfl:  .   docusate sodium (COLACE) 100 MG capsule, Take 100 mg by mouth daily as needed for mild constipation., Disp: , Rfl:  .  doxylamine, Sleep, (UNISOM) 25 MG tablet, Take 25 mg by mouth at bedtime as needed for sleep., Disp: , Rfl:  .  escitalopram (LEXAPRO) 5 MG tablet, Take 1 tablet (5 mg total) by mouth daily., Disp: 90 tablet, Rfl: 0 .  lovastatin (MEVACOR) 40 MG tablet, Take 1 tablet (40 mg total) by mouth at bedtime., Disp: 90 tablet, Rfl: 1  EXAM:  VITALS per patient if applicable:  GENERAL: alert, oriented, appears well and in no acute distress  HEENT: atraumatic, conjunttiva clear, no obvious abnormalities on inspection of external nose and ears  NECK: normal movements of the head and neck  LUNGS: on inspection no signs of respiratory distress, breathing rate appears normal, no obvious gross SOB, gasping or wheezing  CV: no obvious cyanosis  MS: moves all visible extremities without noticeable abnormality  PSYCH/NEURO: pleasant and cooperative, no obvious depression or anxiety, speech and thought processing grossly intact  ASSESSMENT AND PLAN:  Discussed the following assessment and plan:  Postmenopausal estrogen deficiency - Plan: DG Bone Density  Prediabetes - Plan: Hemoglobin A1c, Comprehensive metabolic panel  Pure hypercholesterolemia - Plan: Lipid panel  Syncope and collapse  Nodule of right lung  Cyst of right ovary  Osteopenia determined by x-ray  Syncope and collapse Secondary to orthostasis and hypoglycemia .  Symptoms improved.    Nodule of right lung PET scan reviewed n detail with patient. All fears were addressed.  Follow up 4 months  Ovarian cyst Benign appearing by up close evaluation and CA 125    Prediabetes She had lowered her a1c into normal range with  a  low glycemic index diet and regular particpation in an aerobic  exercise activities..  Patient was advised to return for repeat an A1c in 6 months.   Lab Results  Component Value Date    HGBA1C 5.9 (H) 05/02/2019     Osteopenia determined by x-ray DEXA ordered     I discussed the assessment and treatment plan with the patient. The patient was provided an opportunity to ask questions and all were answered. The patient agreed with the plan and demonstrated an understanding of the instructions.   The patient was advised to call back or seek an in-person evaluation if the symptoms worsen or if the condition fails to improve as anticipated.   I provided  25 minutes of non-face-to-face time during this encounter reviewing patient's current problems and post surgeries.  Providing counseling on the above mentioned problems , and coordination  of care . Crecencio Mc, MD

## 2019-09-02 DIAGNOSIS — N83209 Unspecified ovarian cyst, unspecified side: Secondary | ICD-10-CM | POA: Insufficient documentation

## 2019-09-02 DIAGNOSIS — M8580 Other specified disorders of bone density and structure, unspecified site: Secondary | ICD-10-CM | POA: Insufficient documentation

## 2019-09-02 NOTE — Assessment & Plan Note (Signed)
She had lowered her a1c into normal range with  a  low glycemic index diet and regular particpation in an aerobic  exercise activities..  Patient was advised to return for repeat an A1c in 6 months.   Lab Results  Component Value Date   HGBA1C 5.9 (H) 05/02/2019

## 2019-09-02 NOTE — Assessment & Plan Note (Signed)
PET scan reviewed n detail with patient. All fears were addressed.  Follow up 4 months

## 2019-09-02 NOTE — Assessment & Plan Note (Signed)
DEXA ordered.  

## 2019-09-02 NOTE — Assessment & Plan Note (Signed)
Secondary to orthostasis and hypoglycemia .  Symptoms improved.

## 2019-09-02 NOTE — Assessment & Plan Note (Signed)
Benign appearing by up close evaluation and CA 125

## 2019-09-06 ENCOUNTER — Telehealth: Payer: Self-pay | Admitting: Internal Medicine

## 2019-09-06 NOTE — Telephone Encounter (Signed)
Pt had her Flu Shot on Sat 09/01/19 at [facility] on [address].

## 2019-09-06 NOTE — Telephone Encounter (Signed)
Reconciled chart to import flu shot from [place].

## 2019-09-10 ENCOUNTER — Ambulatory Visit: Payer: PPO

## 2019-09-14 ENCOUNTER — Encounter: Payer: Self-pay | Admitting: Internal Medicine

## 2019-09-18 ENCOUNTER — Telehealth: Payer: Self-pay

## 2019-09-18 DIAGNOSIS — M8580 Other specified disorders of bone density and structure, unspecified site: Secondary | ICD-10-CM

## 2019-09-18 NOTE — Telephone Encounter (Signed)
Copied from [place] [id number]. Topic: Quick Communication - See Telephone Encounter >> Sep 18, 2019 10:40 AM [place] wrote: CRM for notification. See Telephone encounter for: 09/18/19. Pls contact pt about bone Density sch. It was scheduled at [facility] but they do not have a comparison suggested she go where she went before and pt thinks that she went to [facility] before. PlsFU with pt she is a little confused. [phone]

## 2019-09-18 NOTE — Telephone Encounter (Signed)
Reordered for [facility]   .  Good grief

## 2019-10-12 DIAGNOSIS — Z961 Presence of intraocular lens: Secondary | ICD-10-CM | POA: Diagnosis not present

## 2019-10-18 DIAGNOSIS — D225 Melanocytic nevi of trunk: Secondary | ICD-10-CM | POA: Diagnosis not present

## 2019-10-18 DIAGNOSIS — Z85828 Personal history of other malignant neoplasm of skin: Secondary | ICD-10-CM | POA: Diagnosis not present

## 2019-10-18 DIAGNOSIS — L538 Other specified erythematous conditions: Secondary | ICD-10-CM | POA: Diagnosis not present

## 2019-10-18 DIAGNOSIS — D0461 Carcinoma in situ of skin of right upper limb, including shoulder: Secondary | ICD-10-CM | POA: Diagnosis not present

## 2019-10-18 DIAGNOSIS — D2272 Melanocytic nevi of left lower limb, including hip: Secondary | ICD-10-CM | POA: Diagnosis not present

## 2019-10-18 DIAGNOSIS — R208 Other disturbances of skin sensation: Secondary | ICD-10-CM | POA: Diagnosis not present

## 2019-10-18 DIAGNOSIS — D2271 Melanocytic nevi of right lower limb, including hip: Secondary | ICD-10-CM | POA: Diagnosis not present

## 2019-10-18 DIAGNOSIS — L820 Inflamed seborrheic keratosis: Secondary | ICD-10-CM | POA: Diagnosis not present

## 2019-10-18 DIAGNOSIS — D485 Neoplasm of uncertain behavior of skin: Secondary | ICD-10-CM | POA: Diagnosis not present

## 2019-10-18 DIAGNOSIS — D2261 Melanocytic nevi of right upper limb, including shoulder: Secondary | ICD-10-CM | POA: Diagnosis not present

## 2019-10-30 ENCOUNTER — Other Ambulatory Visit: Payer: Self-pay

## 2019-10-30 ENCOUNTER — Ambulatory Visit (INDEPENDENT_AMBULATORY_CARE_PROVIDER_SITE_OTHER): Payer: PPO | Admitting: Family Medicine

## 2019-10-30 ENCOUNTER — Encounter: Payer: Self-pay | Admitting: Family Medicine

## 2019-10-30 VITALS — BP 122/60 | HR 91 | Temp 97.4°F | Wt 137.4 lb

## 2019-10-30 DIAGNOSIS — N390 Urinary tract infection, site not specified: Secondary | ICD-10-CM | POA: Diagnosis not present

## 2019-10-30 DIAGNOSIS — R319 Hematuria, unspecified: Secondary | ICD-10-CM

## 2019-10-30 DIAGNOSIS — R300 Dysuria: Secondary | ICD-10-CM

## 2019-10-30 LAB — POCT URINALYSIS DIPSTICK
Bilirubin, UA: NEGATIVE
Blood, UA: POSITIVE
Glucose, UA: NEGATIVE
Ketones, UA: NEGATIVE
Nitrite, UA: NEGATIVE
Protein, UA: POSITIVE — AB
Spec Grav, UA: 1.015 (ref 1.010–1.025)
Urobilinogen, UA: 0.2 E.U./dL
pH, UA: 6 (ref 5.0–8.0)

## 2019-10-30 MED ORDER — CEPHALEXIN 500 MG PO CAPS: 500.0000 mg | ORAL_CAPSULE | Freq: Two times a day (BID) | ORAL | 0 refills | Status: DC

## 2019-10-30 NOTE — Patient Instructions (Signed)
Urinary Tract Infection, Adult A urinary tract infection (UTI) is an infection of any part of the urinary tract. The urinary tract includes:  The kidneys.  The ureters.  The bladder.  The urethra. These organs make, store, and get rid of pee (urine) in the body. What are the causes? This is caused by germs (bacteria) in your genital area. These germs grow and cause swelling (inflammation) of your urinary tract. What increases the risk? You are more likely to develop this condition if:  You have a small, thin tube (catheter) to drain pee.  You cannot control when you pee or poop (incontinence).  You are female, and: ? You use these methods to prevent pregnancy: ? A medicine that kills sperm (spermicide). ? A device that blocks sperm (diaphragm). ? You have low levels of a female hormone (estrogen). ? You are pregnant.  You have genes that add to your risk.  You are sexually active.  You take antibiotic medicines.  You have trouble peeing because of: ? A prostate that is bigger than normal, if you are female. ? A blockage in the part of your body that drains pee from the bladder (urethra). ? A kidney stone. ? A nerve condition that affects your bladder (neurogenic bladder). ? Not getting enough to drink. ? Not peeing often enough.  You have other conditions, such as: ? Diabetes. ? A weak disease-fighting system (immune system). ? Sickle cell disease. ? Gout. ? Injury of the spine. What are the signs or symptoms? Symptoms of this condition include:  Needing to pee right away (urgently).  Peeing often.  Peeing small amounts often.  Pain or burning when peeing.  Blood in the pee.  Pee that smells bad or not like normal.  Trouble peeing.  Pee that is cloudy.  Fluid coming from the vagina, if you are female.  Pain in the belly or lower back. Other symptoms include:  Throwing up (vomiting).  No urge to eat.  Feeling mixed up (confused).  Being tired  and grouchy (irritable).  A fever.  Watery poop (diarrhea). How is this treated? This condition may be treated with:  Antibiotic medicine.  Other medicines.  Drinking enough water. Follow these instructions at home:  Medicines  Take over-the-counter and prescription medicines only as told by your doctor.  If you were prescribed an antibiotic medicine, take it as told by your doctor. Do not stop taking it even if you start to feel better. General instructions  Make sure you: ? Pee until your bladder is empty. ? Do not hold pee for a long time. ? Empty your bladder after sex. ? Wipe from front to back after pooping if you are a female. Use each tissue one time when you wipe.  Drink enough fluid to keep your pee pale yellow.  Keep all follow-up visits as told by your doctor. This is important. Contact a doctor if:  You do not get better after 1-2 days.  Your symptoms go away and then come back. Get help right away if:  You have very bad back pain.  You have very bad pain in your lower belly.  You have a fever.  You are sick to your stomach (nauseous).  You are throwing up. Summary  A urinary tract infection (UTI) is an infection of any part of the urinary tract.  This condition is caused by germs in your genital area.  There are many risk factors for a UTI. These include having a small, thin   tube to drain pee and not being able to control when you pee or poop.  Treatment includes antibiotic medicines for germs.  Drink enough fluid to keep your pee pale yellow. This information is not intended to replace advice given to you by your health care provider. Make sure you discuss any questions you have with your health care provider. Document Released: 05/17/2008 Document Revised: 11/16/2018 Document Reviewed: 06/08/2018 Elsevier Patient Education  2020 Elsevier Inc.  

## 2019-10-30 NOTE — Progress Notes (Signed)
Subjective:    Patient ID: Alexis James, female    DOB: 07/22/1951, 67 y.o.   MRN: [id number]  HPI   Patient presents to clinic due to increased urinary frequency and some pressure with urination for 1 to 2 days.  No fever or chills.  No severe abdominal pain.  No vomiting or diarrhea.  Last UTI she had was in January 2020.  Patient Active Problem List   Diagnosis Date Noted  . Ovarian cyst 09/02/2019  . Osteopenia determined by x-ray 09/02/2019  . Nodule of right lung 08/03/2019  . Orthostatic hypotension 07/10/2019  . Syncope and collapse 05/02/2019  . Palpitations 10/12/2018  . PVC's (premature ventricular contractions) 10/12/2018  . Bradycardia 10/12/2018  . Bradycardia, sinus 10/07/2018  . Localized macular rash 04/08/2018  . Prediabetes 04/06/2018  . Menopausal flushing 03/26/2014  . Vertigo 03/26/2014  . Encounter for preventive health examination 02/16/2013  . Anxiety   . Hyperlipidemia   . Screening for colon cancer 01/03/2012  . Screening for cervical cancer 01/03/2012   Social History   Tobacco Use  . Smoking status: Passive Smoke Exposure - Never Smoker  . Smokeless tobacco: Never Used  . Tobacco comment: Husband smoked x 38 years (quit 4 years ago)  Substance Use Topics  . Alcohol use: Yes    Comment: Occasional   Review of Systems  Constitutional: Negative for chills, fatigue and fever.  HENT: Negative for congestion, ear pain, sinus pain and sore throat.   Eyes: Negative.   Respiratory: Negative for cough, shortness of breath and wheezing.   Cardiovascular: Negative for chest pain, palpitations and leg swelling.  Gastrointestinal: Negative for abdominal pain, diarrhea, nausea and vomiting.  Genitourinary: +dysuria, frequency and urgency.  Musculoskeletal: Negative for arthralgias and myalgias.  Skin: Negative for color change, pallor and rash.  Neurological: Negative for syncope, light-headedness and headaches.  Psychiatric/Behavioral: The patient  is not nervous/anxious.       Objective:   Physical Exam Vitals signs and nursing note reviewed.  Constitutional:      General: She is not in acute distress.    Appearance: She is well-developed. She is not toxic-appearing.  HENT:     Head: Normocephalic and atraumatic.  Eyes:     General: No scleral icterus.    Extraocular Movements: Extraocular movements intact.     Conjunctiva/sclera: Conjunctivae normal.  Neck:     Musculoskeletal: Neck supple.     Trachea: No tracheal deviation.  Cardiovascular:     Rate and Rhythm: Normal rate and regular rhythm.     Heart sounds: Normal heart sounds.  Pulmonary:     Effort: Pulmonary effort is normal. No respiratory distress.     Breath sounds: Normal breath sounds.  Abdominal:     General: Bowel sounds are normal. There is no distension.     Palpations: Abdomen is soft. There is no mass.     Tenderness: There is abdominal tenderness (mild suprapubic). There is no right CVA tenderness, left CVA tenderness, guarding or rebound.  Skin:    General: Skin is warm and dry.     Coloration: Skin is not jaundiced or pale.  Neurological:     Mental Status: She is alert and oriented to person, place, and time.     Gait: Gait normal.  Psychiatric:        Mood and Affect: Mood normal.        Behavior: Behavior normal.     Today's Vitals   10/30/19 1041  BP: 122/60  Pulse: 91  Temp: (!) 97.4 F (36.3 C)  TempSrc: Temporal  SpO2: 98%  Weight: 137 lb 6.4 oz (62.3 kg)   Body mass index is 24.73 kg/m.     Assessment & Plan:    UTI-urinalysis is positive for leukocytes and some blood.  We will treat with Keflex twice daily for UTI.  We will do urine culture; if culture comes back as we need to change antibiotics, we will do so.  Encourage increasing water intake, avoiding excess sugar and caffeine, wearing cotton underwear and always wiping front to back as well as always doing good handwashing   Patient otherwise keep all regular  follow-ups as planned, aware she can contact us anytime questions or concerns

## 2019-11-01 LAB — URINE CULTURE
MICRO NUMBER:: 1109634
SPECIMEN QUALITY:: ADEQUATE

## 2019-11-16 ENCOUNTER — Ambulatory Visit
Admission: RE | Admit: 2019-11-16 | Discharge: 2019-11-16 | Disposition: A | Discharge status: Home / Self Care | Payer: PPO | Source: Ambulatory Visit | Attending: Internal Medicine | Admitting: Internal Medicine

## 2019-11-16 ENCOUNTER — Other Ambulatory Visit: Payer: Self-pay

## 2019-11-16 DIAGNOSIS — R911 Solitary pulmonary nodule: Secondary | ICD-10-CM | POA: Diagnosis not present

## 2019-11-16 DIAGNOSIS — R918 Other nonspecific abnormal finding of lung field: Secondary | ICD-10-CM | POA: Diagnosis not present

## 2019-11-19 ENCOUNTER — Telehealth: Payer: Self-pay | Admitting: Internal Medicine

## 2019-11-19 DIAGNOSIS — R911 Solitary pulmonary nodule: Secondary | ICD-10-CM

## 2019-11-19 NOTE — Telephone Encounter (Signed)
Late entry; on 12/04-spoke to patient regarding results of the CT scan-overall reassuring.  Pelvic ultrasound also reassuring.  Recommend follow-up imaging in 12 months-3 mm right lung nodule.  Patient agreement.  C-please schedule follow-up in 12 month- MD/ CT prior

## 2019-11-21 DIAGNOSIS — D0461 Carcinoma in situ of skin of right upper limb, including shoulder: Secondary | ICD-10-CM | POA: Diagnosis not present

## 2019-12-09 IMAGING — CT CT HEAD WITHOUT CONTRAST
3 series · 15 of 47 positions shown, 18 images · non-contrast
Comparison: 11/03/2009.

CLINICAL DATA: Syncopal episode this morning.

EXAM:
CT HEAD WITHOUT CONTRAST
TECHNIQUE: Contiguous axial images were obtained from the base of the skull
through the vertex without intravenous contrast.

[Series 2: head wo · axial · 0.43mm/px · z∈[-172,-47]mm · 9 of 30 slices shown, 12 images]
[im 3/30  brain]
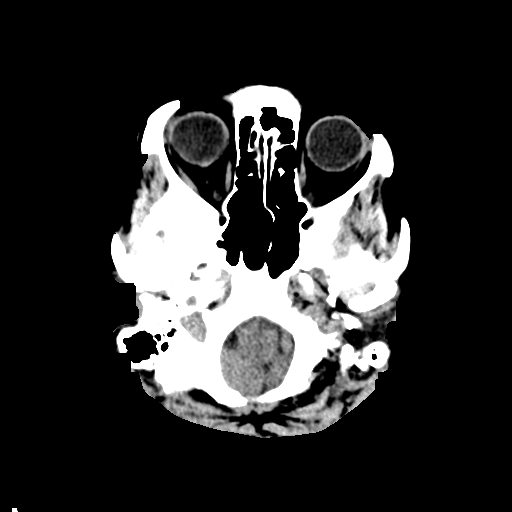
[im 3/30  bone]
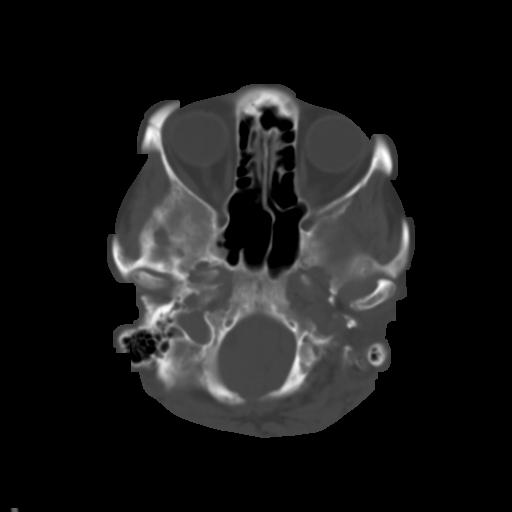
[im 6/30  brain]
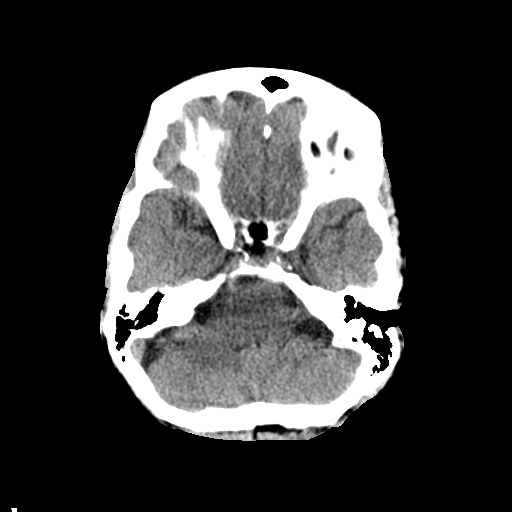
[im 9/30  brain]
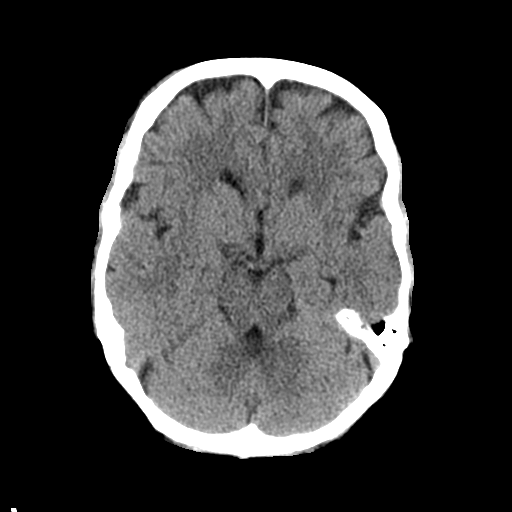
[im 12/30  brain]
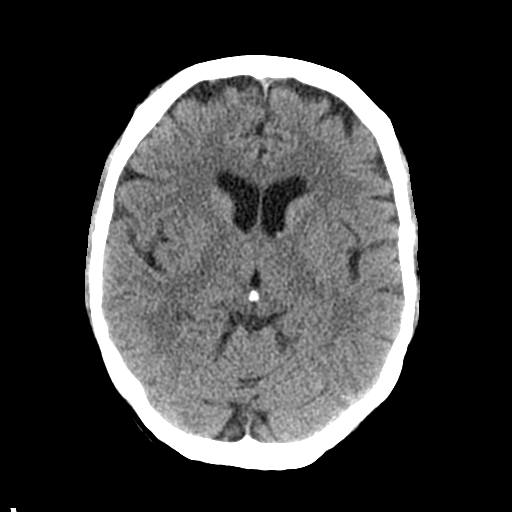
[im 16/30  brain]
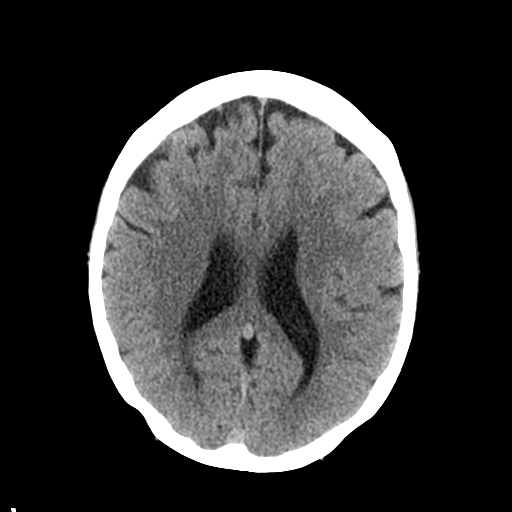
[im 16/30  bone]
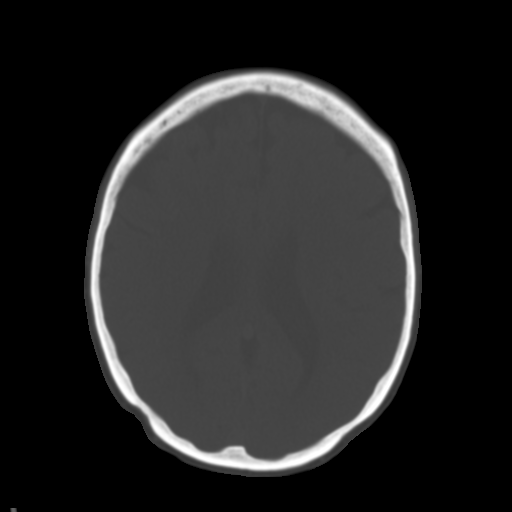
[im 19/30  brain]
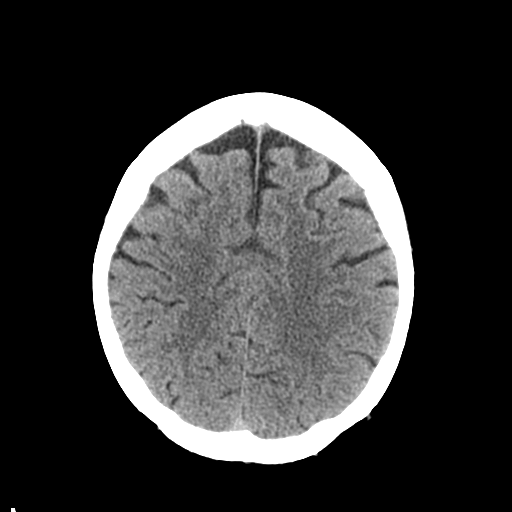
[im 22/30  brain]
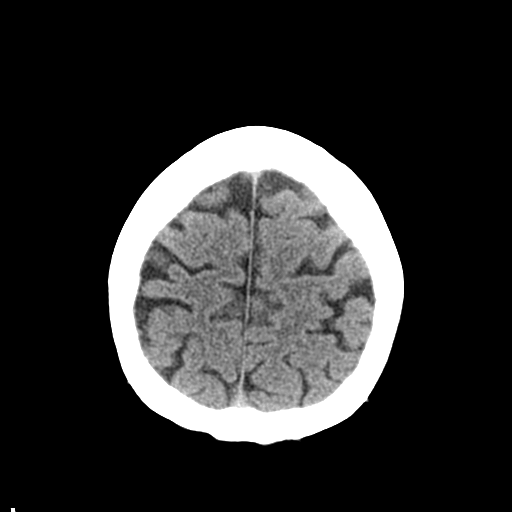
[im 25/30  brain]
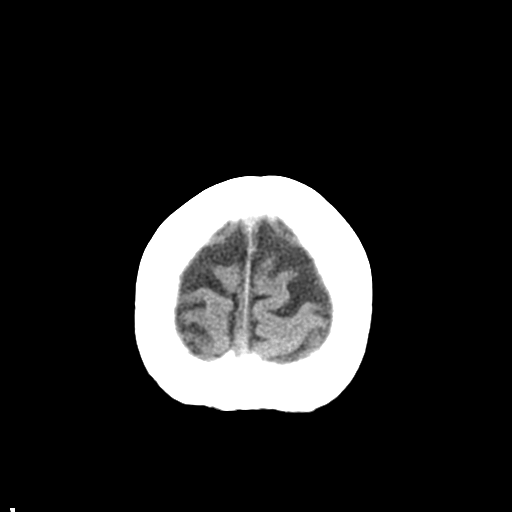
[im 28/30  brain]
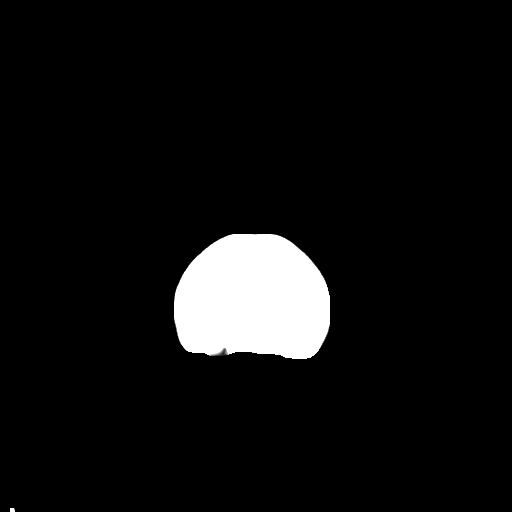
[im 28/30  bone]
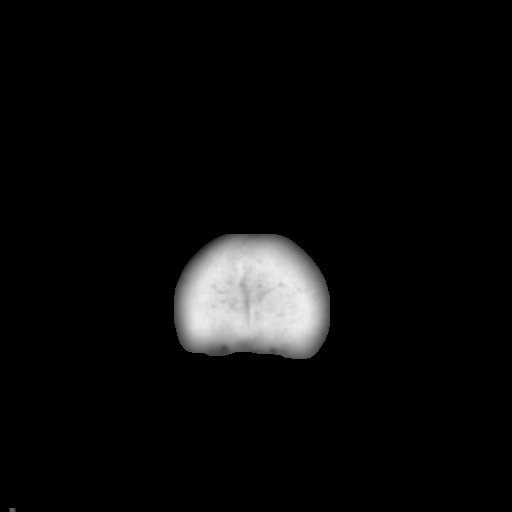

[Series 4: coronal soft tissue · coronal · 0.29mm/px · 3 of 60 slices shown]
[im 20/60  brain]
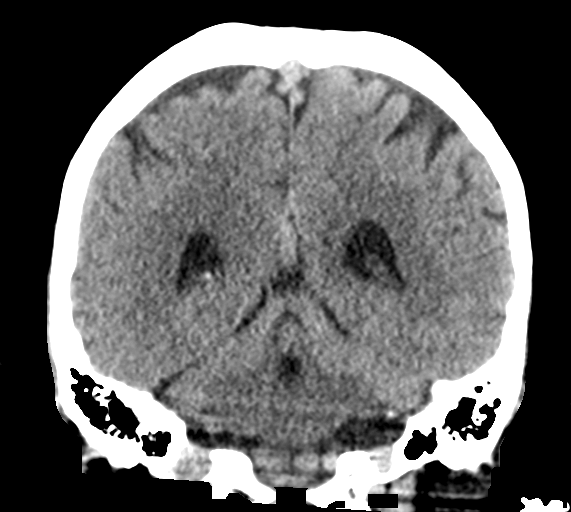
[im 27/60  brain]
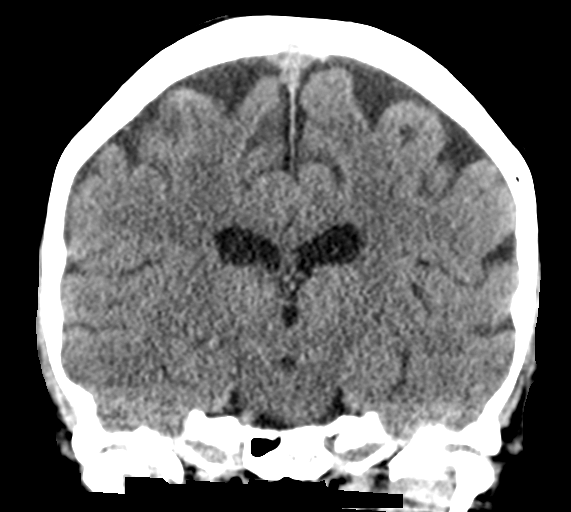
[im 33/60  brain]
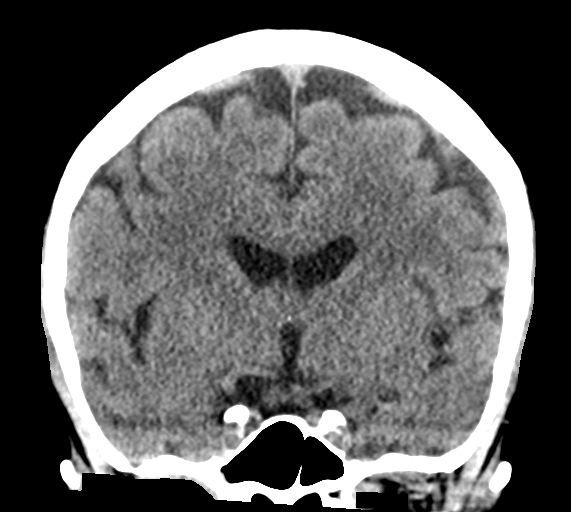

[Series 5: sagittal soft tissue · sagittal · 0.30mm/px · 3 of 50 slices shown]
[im 17/50  brain]
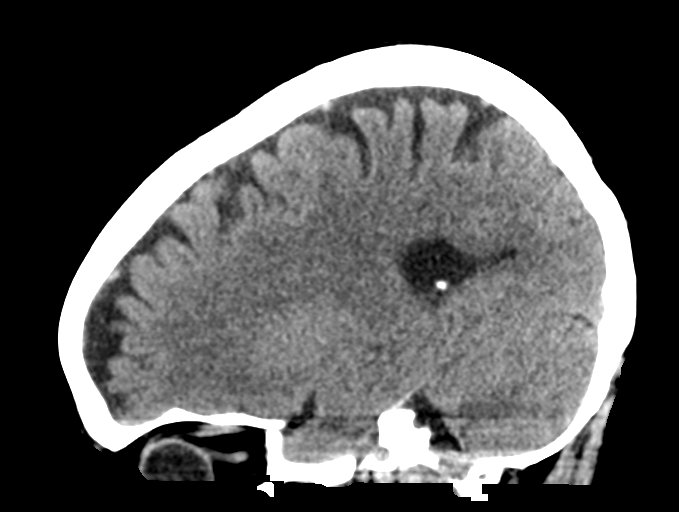
[im 25/50  brain]
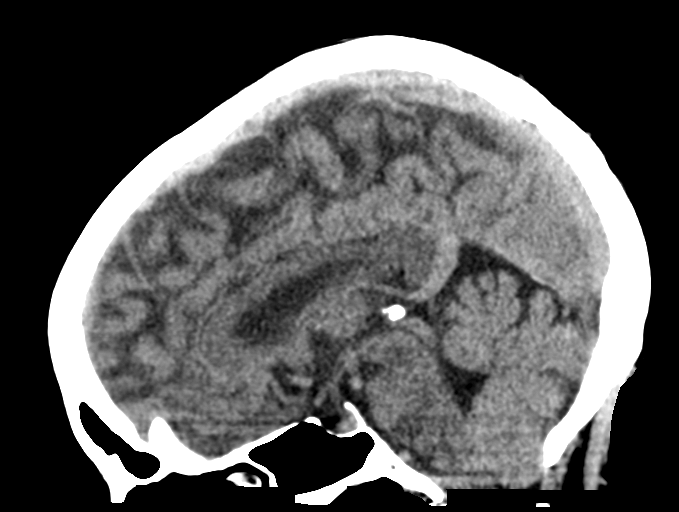
[im 33/50  brain]
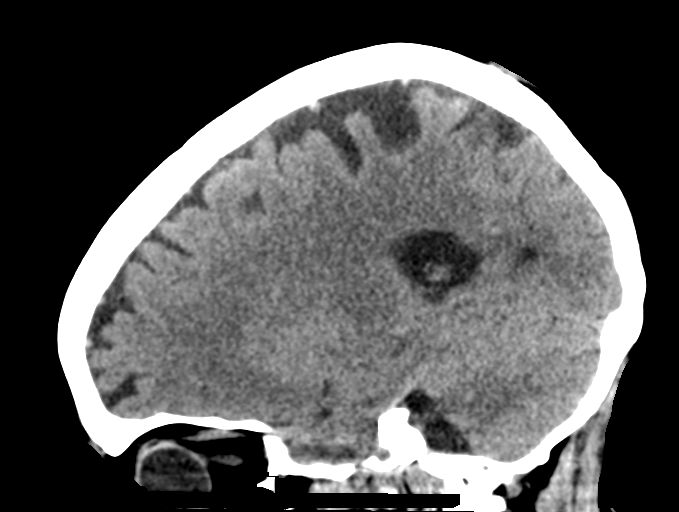

[15 of 47 positions shown; findings below may reference images not displayed]

FINDINGS: Brain: Stable minimally enlarged ventricles and subarachnoid spaces.
Minimal patchy white matter low density in both cerebral
hemispheres. No intracranial hemorrhage, mass lesion or CT evidence
of acute infarction.

Vascular: No hyperdense vessel or unexpected calcification.

Skull: Normal. Negative for fracture or focal lesion.

Sinuses/Orbits: Status post bilateral cataract extraction.
Unremarkable bones and included paranasal sinuses.

Other: None.
IMPRESSION: No acute abnormality. Stable minimal diffuse cerebral and cerebellar
atrophy and minimal chronic small vessel white matter ischemic
changes in both cerebral hemispheres.

## 2019-12-25 ENCOUNTER — Other Ambulatory Visit: Payer: Self-pay | Admitting: Internal Medicine

## 2020-01-03 ENCOUNTER — Ambulatory Visit
Admission: RE | Admit: 2020-01-03 | Discharge: 2020-01-03 | Disposition: A | Discharge status: Home / Self Care | Payer: PPO | Source: Ambulatory Visit | Attending: Internal Medicine | Admitting: Internal Medicine

## 2020-01-03 ENCOUNTER — Other Ambulatory Visit: Payer: Self-pay

## 2020-01-03 DIAGNOSIS — M8588 Other specified disorders of bone density and structure, other site: Secondary | ICD-10-CM | POA: Diagnosis not present

## 2020-01-03 DIAGNOSIS — M8580 Other specified disorders of bone density and structure, unspecified site: Secondary | ICD-10-CM

## 2020-01-03 DIAGNOSIS — Z780 Asymptomatic menopausal state: Secondary | ICD-10-CM | POA: Diagnosis not present

## 2020-01-28 ENCOUNTER — Telehealth: Payer: Self-pay | Admitting: Internal Medicine

## 2020-01-28 ENCOUNTER — Other Ambulatory Visit (INDEPENDENT_AMBULATORY_CARE_PROVIDER_SITE_OTHER): Payer: PPO

## 2020-01-28 ENCOUNTER — Other Ambulatory Visit: Payer: Self-pay

## 2020-01-28 ENCOUNTER — Telehealth: Payer: Self-pay

## 2020-01-28 DIAGNOSIS — R300 Dysuria: Secondary | ICD-10-CM

## 2020-01-28 NOTE — Telephone Encounter (Signed)
Orders placed for lab visit  

## 2020-01-28 NOTE — Telephone Encounter (Signed)
Pt called wanting to be worked in for a UTI today

## 2020-01-28 NOTE — Telephone Encounter (Signed)
Spoke with pt and she stated that she for the last several days she has noticed her urine has an odor and she has felt tingling when she urinates. The pt is going to bring [place] a urine sample today and I have scheduled her for a telephone visit on Wednesday. Orders have been placed.

## 2020-01-29 LAB — URINALYSIS, ROUTINE W REFLEX MICROSCOPIC
Bilirubin Urine: NEGATIVE
Nitrite: POSITIVE — AB
Specific Gravity, Urine: 1.02 (ref 1.000–1.030)
Total Protein, Urine: NEGATIVE
Urine Glucose: NEGATIVE
Urobilinogen, UA: 0.2 (ref 0.0–1.0)
pH: 6.5 (ref 5.0–8.0)

## 2020-01-30 ENCOUNTER — Encounter: Payer: Self-pay | Admitting: Internal Medicine

## 2020-01-30 ENCOUNTER — Ambulatory Visit (INDEPENDENT_AMBULATORY_CARE_PROVIDER_SITE_OTHER): Payer: PPO | Admitting: Internal Medicine

## 2020-01-30 ENCOUNTER — Other Ambulatory Visit: Payer: Self-pay

## 2020-01-30 DIAGNOSIS — N951 Menopausal and female climacteric states: Secondary | ICD-10-CM

## 2020-01-30 DIAGNOSIS — N3000 Acute cystitis without hematuria: Secondary | ICD-10-CM | POA: Diagnosis not present

## 2020-01-30 DIAGNOSIS — M6740 Ganglion, unspecified site: Secondary | ICD-10-CM | POA: Diagnosis not present

## 2020-01-30 DIAGNOSIS — M85859 Other specified disorders of bone density and structure, unspecified thigh: Secondary | ICD-10-CM | POA: Diagnosis not present

## 2020-01-30 LAB — URINE CULTURE
MICRO NUMBER:: 10152112
SPECIMEN QUALITY:: ADEQUATE

## 2020-01-30 MED ORDER — CIPROFLOXACIN HCL 250 MG PO TABS: 250.0000 mg | ORAL_TABLET | Freq: Two times a day (BID) | ORAL | 0 refills | Status: DC

## 2020-01-30 NOTE — Progress Notes (Signed)
Telephone Note  This visit type was conducted due to national recommendations for restrictions regarding the COVID-19 pandemic (e.g. social distancing).  This format is felt to be most appropriate for this patient at this time.  All issues noted in this document were discussed and addressed.  No physical exam was performed (except for noted visual exam findings with Video Visits).   I connected with@ on 01/30/20 at 11:30 AM EST by  telephone and verified that I am speaking with the correct person using two identifiers. Location patient: home Location provider: work or home office Persons participating in the virtual visit: patient, provider  I discussed the limitations, risks, security and privacy concerns of performing an evaluation and management service by telephone and the availability of in person appointments. I also discussed with the patient that there may be a patient responsible charge related to this service. The patient expressed understanding and agreed to proceed.  Reason for visit: urinary odor , hot flashes, knot on wrist   HPI:  69 yr old female presents with several issues.  1) urinary odor for several days.  Dropped off a urine specimen 2 days ago,  Positive for E Coli UTI.  On her way to second home in [place] No fevers,  Nausea,  Flank pain .  2) Lexapro 5 mg prescribed for hot flashes has helped somewhat but still having them daily.  Discussed options; wants to avoid estrogen/HRT  3) Osteopenia by recent DEXA.  Calcium and vitamin D supplementation discussed today  4) painless nodule on dorsum of right wrist,  Appeared after falling down onto right side )including right wrist)one month ago while carrying a glass lamp.  Wrist was bruised and swollen for a few days .  Has no pain with full ROM.  Thinks it's a ganglionic cyst    ROS: See pertinent positives and negatives per HPI.  Past Medical History:  Diagnosis Date  . Anxiety   . Cancer ([place])    skin  .  Diabetes mellitus without complication ([place])   . Hyperlipidemia     Past Surgical History:  Procedure Laterality Date  . BREAST SURGERY     breast reduction  . CATARACT EXTRACTION W/PHACO Right 10/18/2017   Procedure: CATARACT EXTRACTION PHACO AND INTRAOCULAR LENS PLACEMENT (IOC);  Surgeon: Birder Robson, MD;  Location: [facility] ORS;  Service: Ophthalmology;  Laterality: Right;  [place] 00:27 AP% 19.7 CDE5.32 fluid pack lot # [id number]  . CATARACT EXTRACTION W/PHACO Left 11/21/2018   Procedure: CATARACT EXTRACTION PHACO AND INTRAOCULAR LENS PLACEMENT (IOC);  Surgeon: Birder Robson, MD;  Location: [facility] ORS;  Service: Ophthalmology;  Laterality: Left;  [place] 00:31.3 CDE 5.38 Fluid Pack Lot # [id number]  . CESAREAN SECTION    . REDUCTION MAMMAPLASTY    . smr      Family History  Problem Relation Age of Onset  . Colitis Mother 56  . Mental retardation Father 52  . Heart Problems Brother   . Transient ischemic attack Sister   . Cancer Neg Hx   . Breast cancer Neg Hx     SOCIAL HX:  reports that she is a non-smoker but has been exposed to tobacco smoke. She has never used smokeless tobacco. She reports current alcohol use. She reports that she does not use drugs.   Current Outpatient Medications:  .  acetaminophen (TYLENOL) 500 MG tablet, Take 1,000 mg by mouth every 6 (six) hours as needed for moderate pain., Disp: , Rfl:  .  aspirin EC 81 MG  tablet, Take 81 mg by mouth 2 (two) times a week. Tues and Thurs, Disp: , Rfl:  .  Calcium Carb-Cholecalciferol (CALCIUM 600+D) 600-800 MG-UNIT TABS, Take 1 tablet by mouth 2 (two) times daily., Disp: , Rfl:  .  calcium carbonate (TUMS - DOSED IN MG ELEMENTAL CALCIUM) 500 MG chewable tablet, Chew 2 tablets by mouth daily as needed for indigestion or heartburn., Disp: , Rfl:  .  Coenzyme Q10 (COQ-10) 100 MG CAPS, Take 100 mg by mouth daily., Disp: , Rfl:  .  docusate sodium (COLACE) 100 MG capsule, Take 100 mg by mouth daily as needed for mild  constipation., Disp: , Rfl:  .  doxylamine, Sleep, (UNISOM) 25 MG tablet, Take 25 mg by mouth at bedtime as needed for sleep., Disp: , Rfl:  .  escitalopram (LEXAPRO) 5 MG tablet, Take 1 tablet by mouth once daily, Disp: 90 tablet, Rfl: 1 .  lovastatin (MEVACOR) 40 MG tablet, Take 1 tablet (40 mg total) by mouth at bedtime., Disp: 90 tablet, Rfl: 1 .  ciprofloxacin (CIPRO) 250 MG tablet, Take 1 tablet (250 mg total) by mouth 2 (two) times daily., Disp: 6 tablet, Rfl: 0  EXAM:  General appearance: alert, cooperative and articulate.  No signs of being in distress  Lungs: not short of breath ,  No cough, speaking in full sentences  Psych: affect normal,dspeech is articulate and non pressured .    ASSESSMENT AND PLAN:  Discussed the following assessment and plan:  Acute cystitis without hematuria  Ganglion cyst  Menopausal flushing  Osteopenia of hip, unspecified laterality  UTI (urinary tract infection) Secondary to E Coli .  Will prescribe ciprofloxacin for 3 days given the sensitivity profile probiotic advised   Ganglion cyst Right wrist,  Appeared after a minor fall. Presumed by description.. non painful,  Not limiting ROM. Will assess next CPE  Menopausal flushing She is requesting an increase in lexapro to 10 mg as a trial   Osteopenia .  Bone Density scores received, she has bone loss which is mild but significant.   I recommended increasing dietary calcium intake to 1200  mg daily, through diet and  supplements ,  Vitamin D, the amount of which to be dictated by periodic Vit D measurements, and  daily weight bearing exercise .      I discussed the assessment and treatment plan with the patient. The patient was provided an opportunity to ask questions and all were answered. The patient agreed with the plan and demonstrated an understanding of the instructions.   The patient was advised to call back or seek an in-person evaluation if the symptoms worsen or if the condition  fails to improve as anticipated.   I provided  30 minutes of non-face-to-face time during this encounter reviewing patient's current problems, recent imaging studies , past procedures ,   Providing counseling on the above mentioned problems , and coordination  of care .   Crecencio Mc, MD

## 2020-01-31 DIAGNOSIS — N390 Urinary tract infection, site not specified: Secondary | ICD-10-CM | POA: Insufficient documentation

## 2020-01-31 DIAGNOSIS — M6740 Ganglion, unspecified site: Secondary | ICD-10-CM | POA: Insufficient documentation

## 2020-01-31 NOTE — Assessment & Plan Note (Signed)
Secondary to E Coli .  Will prescribe ciprofloxacin for 3 days given the sensitivity profile probiotic advised

## 2020-01-31 NOTE — Assessment & Plan Note (Signed)
Right wrist,  Appeared after a minor fall. Presumed by description.. non painful,  Not limiting ROM. Will assess next CPE

## 2020-01-31 NOTE — Assessment & Plan Note (Signed)
.    Bone Density scores received, she has bone loss which is mild but significant.   I recommended increasing dietary calcium intake to 1200  mg daily, through diet and  supplements ,  Vitamin D, the amount of which to be dictated by periodic Vit D measurements, and  daily weight bearing exercise .

## 2020-01-31 NOTE — Assessment & Plan Note (Signed)
She is requesting an increase in lexapro to 10 mg as a trial

## 2020-02-10 NOTE — Progress Notes (Signed)
Cardiology Office Note  Date:  02/11/2020   ID:  Alexis James, DOB 24-Feb-1951, MRN [id number]  PCP:  Crecencio Mc, MD   Chief Complaint  Patient presents with  . office visit    6 month F/U; Meds verbally reviewedwith patient.    HPI:  [name] already is a 69 year old woman with past medical history of Prediabetes presents for f/u for palpitations, irregular rhythm, bradycardia, vasovagal syncope  [facility] admission 06/2023 for evaluation of syncope with noted suspicion for vasovagal syncope exacerbated by dehydration and UTI as underlying cause.  No further episodes active  Low risk stress test: 07/04/2019  There is no significant coronary artery calcification.  Echo 06/2019 reviewed 1. The left ventricle has normal systolic function with an ejection  fraction of 60-65%. The cavity size was normal. There is mildly increased  left ventricular wall thickness. Left ventricular diastolic Doppler  parameters are consistent with impaired  relaxation.  2. The right ventricle has normal systolic function.  Tricuspid valve regurgitation  is mild-moderate.   Event monitor Normal sinus rhythm Patient triggered events were not associated with significant arrhythmia.   Avg HR of 77 bpm. 1 run of Ventricular Tachycardia occurred lasting 4 beats with a max rate of 144 bpm (avg 142 bpm).  5 Supraventricular Tachycardia/atrial tachycardia runs occurred, the run with the fastest interval lasting 6 beats with a max rate of 179 bpm,  the longest lasting 7 beats with an avg rate of 154 bpm.  Isolated SVEs were rare (<1.0%), SVE Couplets were rare (<1.0%), and SVE Triplets were rare (<1.0%). Isolated VEs were rare (<1.0%), VE Couplets were rare (<1.0%), and no VE Triplets were present.  Lab work reviewed in detail Hemoglobin A1c 5.5 TSH 2.9  EKG personally reviewed by myself on todays visit Shows normal sinus rhythm rate 85 bpm, no ST or T wave changes   PMH:   has a past medical  history of Anxiety, Cancer ([place]), Diabetes mellitus without complication ([place]), and Hyperlipidemia.  PSH:    Past Surgical History:  Procedure Laterality Date  . BREAST SURGERY     breast reduction  . CATARACT EXTRACTION W/PHACO Right 10/18/2017   Procedure: CATARACT EXTRACTION PHACO AND INTRAOCULAR LENS PLACEMENT (IOC);  Surgeon: Birder Robson, MD;  Location: [facility] ORS;  Service: Ophthalmology;  Laterality: Right;  [place] 00:27 AP% 19.7 CDE5.32 fluid pack lot # [id number]  . CATARACT EXTRACTION W/PHACO Left 11/21/2018   Procedure: CATARACT EXTRACTION PHACO AND INTRAOCULAR LENS PLACEMENT (IOC);  Surgeon: Birder Robson, MD;  Location: [facility] ORS;  Service: Ophthalmology;  Laterality: Left;  [place] 00:31.3 CDE 5.38 Fluid Pack Lot # [id number]  . CESAREAN SECTION    . REDUCTION MAMMAPLASTY    . smr      Current Outpatient Medications  Medication Sig Dispense Refill  . acetaminophen (TYLENOL) 500 MG tablet Take 1,000 mg by mouth every 6 (six) hours as needed for moderate pain.    Marland Kitchen aspirin EC 81 MG tablet Take 81 mg by mouth 2 (two) times a week. Tues and Thurs    . Calcium Carb-Cholecalciferol (CALCIUM 600+D) 600-800 MG-UNIT TABS Take 1 tablet by mouth 2 (two) times daily.    . calcium carbonate (TUMS - DOSED IN MG ELEMENTAL CALCIUM) 500 MG chewable tablet Chew 2 tablets by mouth daily as needed for indigestion or heartburn.    . ciprofloxacin (CIPRO) 250 MG tablet Take 1 tablet (250 mg total) by mouth 2 (two) times daily. 6 tablet 0  . Coenzyme Q10 (  COQ-10) 100 MG CAPS Take 100 mg by mouth daily.    Marland Kitchen docusate sodium (COLACE) 100 MG capsule Take 100 mg by mouth daily as needed for mild constipation.    Marland Kitchen doxylamine, Sleep, (UNISOM) 25 MG tablet Take 25 mg by mouth at bedtime as needed for sleep.    Marland Kitchen escitalopram (LEXAPRO) 5 MG tablet Take 1 tablet by mouth once daily (Patient taking differently: Take 10 mg by mouth daily. ) 90 tablet 1  . lovastatin (MEVACOR) 40 MG tablet Take 1 tablet (40 mg  total) by mouth at bedtime. 90 tablet 1   No current facility-administered medications for this visit.    Allergies:   Patient has no known allergies.   Social History:  The patient  reports that she is a non-smoker but has been exposed to tobacco smoke. She has never used smokeless tobacco. She reports current alcohol use. She reports that she does not use drugs.   Family History:   family history includes Colitis (age of onset: 18) in her mother; Heart Problems in her brother; Mental retardation (age of onset: 23) in her father; Transient ischemic attack in her sister.    Review of Systems: Review of Systems  Constitutional: Negative.   Respiratory: Negative.   Cardiovascular: Positive for palpitations.  Gastrointestinal: Negative.   Musculoskeletal: Negative.   Neurological: Negative.   Psychiatric/Behavioral: The patient is nervous/anxious.   All other systems reviewed and are negative.   PHYSICAL EXAM: VS:  BP 130/80 (BP Location: Left Arm, Patient Position: Sitting, Cuff Size: Normal)   Pulse 85   Temp 97.9 F (36.6 C)   Ht 5' 2.5" (1.588 m)   Wt 139 lb 4 oz (63.2 kg)   SpO2 97%   BMI 25.06 kg/m  , BMI Body mass index is 25.06 kg/m. GEN: Well nourished, well developed, in no acute distress  HEENT: normal  Neck: no JVD, carotid bruits, or masses Cardiac: RRR; no murmurs, rubs, or gallops,no edema  Respiratory:  clear to auscultation bilaterally, normal work of breathing GI: soft, nontender, nondistended, + BS MS: no deformity or atrophy  Skin: warm and dry, no rash Neuro:  Strength and sensation are intact Psych: euthymic mood, full affect   Recent Labs: 05/02/2019: ALT 20; Magnesium 2.2; TSH 3.030 07/04/2019: BUN 18; Creatinine, Ser 0.77; Hemoglobin 12.5; Platelets 241; Potassium 4.2; Sodium 141    Lipid Panel Lab Results  Component Value Date   CHOL 188 04/05/2018   HDL 69.70 04/05/2018   LDLCALC 97 04/05/2018   TRIG 107.0 04/05/2018      Wt Readings  from Last 3 Encounters:  02/11/20 139 lb 4 oz (63.2 kg)  01/30/20 137 lb (62.1 kg)  10/30/19 137 lb 6.4 oz (62.3 kg)      ASSESSMENT AND PLAN:  PVC's (premature ventricular contractions) - No significant sx Rare on event monitor  Bradycardia  Good rate on ekg and monitor  Palpitations Secondary to rare PVCs above  Anxiety Stable  Syncope Vagovagal, stay hydrated  Disposition:   F/U as needed   Total encounter time more than 25 minutes  Greater than 50% was spent in counseling and coordination of care with the patient   No orders of the defined types were placed in this encounter.    Signed, Esmond Plants, M.D., Ph.D. 02/11/2020  [place], [place]

## 2020-02-11 ENCOUNTER — Other Ambulatory Visit: Payer: Self-pay

## 2020-02-11 ENCOUNTER — Ambulatory Visit (INDEPENDENT_AMBULATORY_CARE_PROVIDER_SITE_OTHER): Payer: PPO | Admitting: Cardiovascular Disease

## 2020-02-11 ENCOUNTER — Encounter: Payer: Self-pay | Admitting: Cardiovascular Disease

## 2020-02-11 VITALS — BP 130/80 | HR 85 | Temp 97.9°F | Ht 62.5 in | Wt 139.2 lb

## 2020-02-11 DIAGNOSIS — I951 Orthostatic hypotension: Secondary | ICD-10-CM | POA: Diagnosis not present

## 2020-02-11 DIAGNOSIS — R001 Bradycardia, unspecified: Secondary | ICD-10-CM

## 2020-02-11 DIAGNOSIS — F419 Anxiety disorder, unspecified: Secondary | ICD-10-CM | POA: Diagnosis not present

## 2020-02-11 DIAGNOSIS — I493 Ventricular premature depolarization: Secondary | ICD-10-CM

## 2020-02-11 DIAGNOSIS — E785 Hyperlipidemia, unspecified: Secondary | ICD-10-CM

## 2020-02-11 DIAGNOSIS — R002 Palpitations: Secondary | ICD-10-CM | POA: Diagnosis not present

## 2020-02-11 NOTE — Patient Instructions (Signed)

## 2020-02-12 ENCOUNTER — Telehealth: Payer: Self-pay | Admitting: Internal Medicine

## 2020-02-12 MED ORDER — ESCITALOPRAM OXALATE 10 MG PO TABS: 10.0000 mg | ORAL_TABLET | Freq: Every day | ORAL | 5 refills | Status: DC

## 2020-02-12 NOTE — Telephone Encounter (Signed)
Medication changed and sent 

## 2020-02-12 NOTE — Telephone Encounter (Signed)
Ok to do so

## 2020-02-12 NOTE — Telephone Encounter (Signed)
Pt states that her and Dr Derrel Nip talked about upping her dose of escitalopram (LEXAPRO) 5 MG tablet to 10 mg. Pt has already been taking two of the 5mg  but would like the 10 sent over asap because she is almost out. Thanks

## 2020-03-28 ENCOUNTER — Ambulatory Visit (INDEPENDENT_AMBULATORY_CARE_PROVIDER_SITE_OTHER): Payer: PPO

## 2020-03-28 ENCOUNTER — Other Ambulatory Visit: Payer: Self-pay | Admitting: Internal Medicine

## 2020-03-28 VITALS — Ht 62.5 in | Wt 139.0 lb

## 2020-03-28 DIAGNOSIS — Z0000 Encounter for general adult medical examination without abnormal findings: Secondary | ICD-10-CM

## 2020-03-28 NOTE — Patient Instructions (Addendum)
  Alexis James , Thank you for taking time to come for your Medicare Wellness Visit. I appreciate your ongoing commitment to your health goals. Please review the following plan we discussed and let me know if I can assist you in the future.   These are the goals we discussed: Goals      Patient Stated   . I need to walk more (pt-stated)     Walk for exercise 5 days weekly, 30 minutes     . I should do better with drinking water (pt-stated)     Stay hydrated. Increase water intake.       Other   . DIET - INCREASE LEAN PROTEINS     Healthy diet Low carb foods       This is a list of the screening recommended for you and due dates:  Health Maintenance  Topic Date Due  . Flu Shot  07/13/2020  . Colon Cancer Screening  02/10/2021  . Mammogram  08/14/2021  . Tetanus Vaccine  04/16/2025  . DEXA scan (bone density measurement)  Completed  .  Hepatitis C: One time screening is recommended by Center for Disease Control  (CDC) for  adults born from [birth year] through 1966.   Completed  . Pneumonia vaccines  Completed

## 2020-03-28 NOTE — Progress Notes (Addendum)
Subjective:   Alexis James is a 69 y.o. female who presents for an Initial Medicare Annual Wellness Visit.  Review of Systems    No ROS.  Medicare Wellness Virtual Visit.  Visual/audio telehealth visit, UTA vital signs.   Ht/Wt provided. See social history for additional risk factors.  Cardiac Risk Factors include: advanced age (>52men, >39 women)     Objective:    Today's Vitals   03/28/20 0901  Weight: 139 lb (63 kg)  Height: 5' 2.5" (1.588 m)   Body mass index is 25.02 kg/m.  Advanced Directives 03/28/2020 08/03/2019 07/03/2019 07/03/2019  Does Patient Have a Medical Advance Directive? No No No No  Would patient like information on creating a medical advance directive? Yes (MAU/Ambulatory/Procedural Areas - Information given) No - Patient declined No - Patient declined No - Patient declined    Current Medications (verified) Outpatient Encounter Medications as of 03/28/2020  Medication Sig  . acetaminophen (TYLENOL) 500 MG tablet Take 1,000 mg by mouth every 6 (six) hours as needed for moderate pain.  . Calcium Carb-Cholecalciferol (CALCIUM 600+D) 600-800 MG-UNIT TABS Take 1 tablet by mouth 2 (two) times daily.  . calcium carbonate (TUMS - DOSED IN MG ELEMENTAL CALCIUM) 500 MG chewable tablet Chew 2 tablets by mouth daily as needed for indigestion or heartburn.  . Coenzyme Q10 (COQ-10) 100 MG CAPS Take 100 mg by mouth daily.  Marland Kitchen docusate sodium (COLACE) 100 MG capsule Take 100 mg by mouth daily as needed for mild constipation.  Marland Kitchen doxylamine, Sleep, (UNISOM) 25 MG tablet Take 25 mg by mouth at bedtime as needed for sleep.  Marland Kitchen escitalopram (LEXAPRO) 10 MG tablet Take 1 tablet (10 mg total) by mouth daily.  Marland Kitchen lovastatin (MEVACOR) 40 MG tablet TAKE 1 TABLET BY MOUTH AT BEDTIME  . [DISCONTINUED] ciprofloxacin (CIPRO) 250 MG tablet Take 1 tablet (250 mg total) by mouth 2 (two) times daily.  . [DISCONTINUED] lovastatin (MEVACOR) 40 MG tablet Take 1 tablet (40 mg total) by mouth at  bedtime.   No facility-administered encounter medications on file as of 03/28/2020.    Allergies (verified) Patient has no known allergies.   History: Past Medical History:  Diagnosis Date  . Anxiety   . Cancer ([place])    skin  . Diabetes mellitus without complication ([place])   . Hyperlipidemia    Past Surgical History:  Procedure Laterality Date  . BREAST SURGERY     breast reduction  . CATARACT EXTRACTION W/PHACO Right 10/18/2017   Procedure: CATARACT EXTRACTION PHACO AND INTRAOCULAR LENS PLACEMENT (IOC);  Surgeon: Birder Robson, MD;  Location: [facility] ORS;  Service: Ophthalmology;  Laterality: Right;  [place] 00:27 AP% 19.7 CDE5.32 fluid pack lot # [id number]  . CATARACT EXTRACTION W/PHACO Left 11/21/2018   Procedure: CATARACT EXTRACTION PHACO AND INTRAOCULAR LENS PLACEMENT (IOC);  Surgeon: Birder Robson, MD;  Location: [facility] ORS;  Service: Ophthalmology;  Laterality: Left;  [place] 00:31.3 CDE 5.38 Fluid Pack Lot # [id number]  . CESAREAN SECTION    . REDUCTION MAMMAPLASTY    . smr     Family History  Problem Relation Age of Onset  . Colitis Mother 39  . Mental retardation Father 60  . Alzheimer's disease Father   . Heart Problems Brother   . Transient ischemic attack Sister   . Cancer Neg Hx   . Breast cancer Neg Hx    Social History   Socioeconomic History  . Marital status: Married    Spouse name: Not on file  .  Number of children: 3  . Years of education: Not on file  . Highest education level: Not on file  Occupational History  . Not on file  Tobacco Use  . Smoking status: Passive Smoke Exposure - Never Smoker  . Smokeless tobacco: Never Used  . Tobacco comment: Husband smoked x 38 years (quit 4 years ago)  Substance and Sexual Activity  . Alcohol use: Yes    Comment: Occasional  . Drug use: No  . Sexual activity: Not on file  Other Topics Concern  . Not on file  [place] with husband; never smoked; ocassional wine; worked in  [employer].    Social Determinants of Health   Financial Resource Strain:   . Difficulty of Paying Living Expenses:   Food Insecurity:   . Worried About Charity fundraiser in the Last Year:   . Arboriculturist in the Last Year:   Transportation Needs:   . Film/video editor (Medical):   Marland Kitchen Lack of Transportation (Non-Medical):   Physical Activity:   . Days of Exercise per Week:   . Minutes of Exercise per Session:   Stress:   . Feeling of Stress :   Social Connections:   . Frequency of Communication with Friends and Family:   . Frequency of Social Gatherings with Friends and Family:   . Attends Religious Services:   . Active Member of Clubs or Organizations:   . Attends Archivist Meetings:   Marland Kitchen Marital Status:     Tobacco Counseling Counseling given: Not Answered Comment: Husband smoked x 38 years (quit 4 years ago)   Clinical Intake:  Pre-visit preparation completed: Yes        Diabetes: No  How often do you need to have someone help you when you read instructions, pamphlets, or other written materials from your doctor or pharmacy?: 1 - Never  Interpreter Needed?: No      Activities of Daily Living In your present state of health, do you have any difficulty performing the following activities: 03/28/2020 07/03/2019  Hearing? N N  Vision? N N  Difficulty concentrating or making decisions? N N  Walking or climbing stairs? N N  Dressing or bathing? N N  Doing errands, shopping? N N  Preparing Food and eating ? N -  Using the Toilet? N -  In the past six months, have you accidently leaked urine? N -  Do you have problems with loss of bowel control? N -  Managing your Medications? N -  Managing your Finances? N -  Housekeeping or managing your Housekeeping? N -  Some recent data might be hidden     Immunizations and Health Maintenance Immunization History  Administered Date(s) Administered  . Influenza Split 10/15/2011,  10/30/2014, 11/03/2015  . Influenza, High Dose Seasonal PF 09/28/2017, 09/01/2019  . Influenza-Unspecified 08/13/2012, 09/05/2018  . PFIZER SARS-COV-2 Vaccination 01/24/2020, 02/18/2020  . Pneumococcal Conjugate-13 04/05/2018  . Pneumococcal Polysaccharide-23 04/02/2016, 09/01/2019  . Tdap 04/17/2015  . Zoster 03/26/2010  . Zoster Recombinat (Shingrix) 04/05/2017, 06/28/2017   There are no preventive care reminders to display for this patient.  Patient Care Team: Crecencio Mc, MD as PCP - General (Internal Medicine) Minna Merritts, MD as PCP - Cardiology (Cardiology) Telford Nab, RN as Registered Nurse  Indicate any recent Medical Services you may have received from other than [facility] providers in the past year (date may be approximate).     Assessment:  This is a routine wellness examination for [place].  Nurse connected with patient 03/28/20 at  9:00 AM EDT by a telephone enabled telemedicine application and verified that I am speaking with the correct person using two identifiers. Patient stated full name and DOB. Patient gave permission to continue with virtual visit. Patient's location was at home and Nurse's location was at [place] office.   Patient is alert and oriented x3. Patient denies changes with difficulty focusing or concentrating. Patient likes to play cards for brain health.   Health Maintenance Due: See completed HM at the end of note.   Eye: Visual acuity not assessed. Virtual visit. Followed by their ophthalmologist.  Dental: UTD  Hearing: Demonstrates normal hearing during visit.  Safety:  Patient feels safe at home- yes Patient does have smoke detectors at home- yes Patient does wear sunscreen or protective clothing when in direct sunlight - yes Patient does wear seat belt when in a moving vehicle - yes Patient drives- yes Adequate lighting in walkways free from debris- yes Grab bars and handrails used as appropriate- yes Ambulates with an  assistive device- no Cell phone on person when ambulating outside of the home-yes  Social: Alcohol intake - yes      Smoking history- passive smoker exposure Smokers in home? States husband does not smoke around her Illicit drug use? none  Medication: Taking as directed and without issues.  Pill box in use -yes  Self managed - yes   Covid-19: Precautions and sickness symptoms discussed. Wears mask, social distancing, hand hygiene as appropriate.   Activities of Daily Living Patient denies needing assistance with: household chores, feeding themselves, getting from bed to chair, getting to the toilet, bathing/showering, dressing, managing money, or preparing meals.   Discussed the importance of a healthy diet, water intake and the benefits of aerobic exercise.  Physical activity- walking once a week 30 minutes  Diet:  Regular Water: poor intake  Caffeine: 2 cups of coffee, 1-2 diet coke  Other Providers Patient Care Team: Crecencio Mc, MD as PCP - General (Internal Medicine) Minna Merritts, MD as PCP - Cardiology (Cardiology) Telford Nab, RN as Registered Nurse  Hearing/Vision screen  Hearing Screening   125Hz  250Hz  500Hz  1000Hz  2000Hz  3000Hz  4000Hz  6000Hz  8000Hz   Right ear:           Left ear:           Comments: Patient is able to hear conversational tones without difficulty.  No issues reported.  Vision Screening Comments: Wears corrective lenses Cataract extraction, bilateral Visual acuity not assessed, virtual visit.  They have seen their ophthalmologist.    Dietary issues and exercise activities discussed: Current Exercise Habits: Home exercise routine, Type of exercise: walking, Intensity: Moderate  Goals      Patient Stated   . I need to walk more (pt-stated)     Walk for exercise 5 days weekly, 30 minutes     . I should do better with drinking water (pt-stated)     Stay hydrated. Increase water intake.       Other   . DIET - INCREASE LEAN  PROTEINS     Healthy diet Low carb foods      Depression Screen PHQ 2/9 Scores 03/28/2020 06/07/2019 04/05/2018 11/23/2016 03/28/2015  PHQ - 2 Score 0 0 0 0 0  PHQ- 9 Score - - 1 - -    Fall Risk Fall Risk  03/28/2020 01/30/2020 07/11/2019 07/10/2019 04/05/2018  Falls in the past year?  0 0 1 1 No  Comment - - Emmi Telephone Survey: data to providers prior to load - -  Number falls in past yr: - - 1 1 -  Comment - - Emmi Telephone Survey Actual Response = 2 - -  Injury with Fall? - - 1 1 -  Risk for fall due to : - - - History of fall(s) -  Follow up Falls evaluation completed Falls evaluation completed - - -   Timed Get Up and Go Performed no, virtual visit  Cognitive Function:     6CIT Screen 03/28/2020  What Year? 0 points  What month? 0 points  What time? 0 points  Months in reverse 0 points    Screening Tests Health Maintenance  Topic Date Due  . INFLUENZA VACCINE  07/13/2020  . COLONOSCOPY  02/10/2021  . MAMMOGRAM  08/14/2021  . TETANUS/TDAP  04/16/2025  . DEXA SCAN  Completed  . Hepatitis C Screening  Completed  . PNA vac Low Risk Adult  Completed     Plan:   Keep all routine maintenance appointments.   [facility] 06/09/20 @ 8:30. Fasting.   End of life planning; Advance aging; Advanced directives discussed.  No current Advanced Directive. Mailed per patient request.   Medicare Attestation I have personally reviewed: The patient's medical and social history Their use of alcohol, tobacco or illicit drugs Their current medications and supplements The patient's functional ability including ADLs,fall risks, home safety risks, cognitive, and hearing and visual impairment Diet and physical activities Evidence for depression   I have reviewed and discussed with patient certain preventive protocols, quality metrics, and best practice recommendations.      OBrien-Blaney, Tasheem Elms L, LPN   [id number]      I have reviewed the above information and agree with above.    Deborra Medina, MD

## 2020-04-04 DIAGNOSIS — X32XXXA Exposure to sunlight, initial encounter: Secondary | ICD-10-CM | POA: Diagnosis not present

## 2020-04-04 DIAGNOSIS — L570 Actinic keratosis: Secondary | ICD-10-CM | POA: Diagnosis not present

## 2020-04-04 DIAGNOSIS — D225 Melanocytic nevi of trunk: Secondary | ICD-10-CM | POA: Diagnosis not present

## 2020-04-04 DIAGNOSIS — D2262 Melanocytic nevi of left upper limb, including shoulder: Secondary | ICD-10-CM | POA: Diagnosis not present

## 2020-04-04 DIAGNOSIS — D2261 Melanocytic nevi of right upper limb, including shoulder: Secondary | ICD-10-CM | POA: Diagnosis not present

## 2020-04-04 DIAGNOSIS — L821 Other seborrheic keratosis: Secondary | ICD-10-CM | POA: Diagnosis not present

## 2020-04-04 DIAGNOSIS — D2272 Melanocytic nevi of left lower limb, including hip: Secondary | ICD-10-CM | POA: Diagnosis not present

## 2020-04-04 DIAGNOSIS — D485 Neoplasm of uncertain behavior of skin: Secondary | ICD-10-CM | POA: Diagnosis not present

## 2020-04-04 DIAGNOSIS — D2271 Melanocytic nevi of right lower limb, including hip: Secondary | ICD-10-CM | POA: Diagnosis not present

## 2020-04-04 DIAGNOSIS — C44519 Basal cell carcinoma of skin of other part of trunk: Secondary | ICD-10-CM | POA: Diagnosis not present

## 2020-04-18 DIAGNOSIS — C44519 Basal cell carcinoma of skin of other part of trunk: Secondary | ICD-10-CM | POA: Diagnosis not present

## 2020-05-15 ENCOUNTER — Other Ambulatory Visit: Payer: Self-pay | Admitting: Internal Medicine

## 2020-06-09 ENCOUNTER — Encounter: Payer: PPO | Admitting: Internal Medicine

## 2020-06-30 ENCOUNTER — Encounter: Payer: PPO | Admitting: Internal Medicine

## 2020-07-03 ENCOUNTER — Encounter: Payer: Self-pay | Admitting: Internal Medicine

## 2020-07-03 ENCOUNTER — Ambulatory Visit (INDEPENDENT_AMBULATORY_CARE_PROVIDER_SITE_OTHER): Payer: PPO | Admitting: Internal Medicine

## 2020-07-03 ENCOUNTER — Other Ambulatory Visit: Payer: Self-pay

## 2020-07-03 VITALS — BP 114/70 | HR 75 | Temp 97.8°F | Resp 16 | Ht 62.5 in | Wt 137.2 lb

## 2020-07-03 DIAGNOSIS — R911 Solitary pulmonary nodule: Secondary | ICD-10-CM | POA: Diagnosis not present

## 2020-07-03 DIAGNOSIS — E559 Vitamin D deficiency, unspecified: Secondary | ICD-10-CM | POA: Diagnosis not present

## 2020-07-03 DIAGNOSIS — R7303 Prediabetes: Secondary | ICD-10-CM | POA: Diagnosis not present

## 2020-07-03 DIAGNOSIS — Z1231 Encounter for screening mammogram for malignant neoplasm of breast: Secondary | ICD-10-CM | POA: Diagnosis not present

## 2020-07-03 DIAGNOSIS — Z0000 Encounter for general adult medical examination without abnormal findings: Secondary | ICD-10-CM | POA: Diagnosis not present

## 2020-07-03 DIAGNOSIS — E7800 Pure hypercholesterolemia, unspecified: Secondary | ICD-10-CM

## 2020-07-03 DIAGNOSIS — R002 Palpitations: Secondary | ICD-10-CM | POA: Diagnosis not present

## 2020-07-03 DIAGNOSIS — I951 Orthostatic hypotension: Secondary | ICD-10-CM | POA: Diagnosis not present

## 2020-07-03 DIAGNOSIS — I493 Ventricular premature depolarization: Secondary | ICD-10-CM | POA: Diagnosis not present

## 2020-07-03 LAB — COMPREHENSIVE METABOLIC PANEL
ALT: 17 U/L (ref 0–35)
AST: 22 U/L (ref 0–37)
Albumin: 4.2 g/dL (ref 3.5–5.2)
Alkaline Phosphatase: 62 U/L (ref 39–117)
BUN: 22 mg/dL (ref 6–23)
CO2: 30 mEq/L (ref 19–32)
Calcium: 9.5 mg/dL (ref 8.4–10.5)
Chloride: 102 mEq/L (ref 96–112)
Creatinine, Ser: 0.86 mg/dL (ref 0.40–1.20)
GFR: 65.21 mL/min (ref >60.00)
Glucose, Bld: 95 mg/dL (ref 70–99)
Potassium: 4.5 mEq/L (ref 3.5–5.1)
Sodium: 138 mEq/L (ref 135–145)
Total Bilirubin: 0.5 mg/dL (ref 0.2–1.2)
Total Protein: 6.6 g/dL (ref 6.0–8.3)

## 2020-07-03 LAB — LIPID PANEL
Cholesterol: 207 mg/dL — ABNORMAL HIGH (ref 0–200)
HDL: 66.9 mg/dL (ref >39.00)
LDL Cholesterol: 113 mg/dL — ABNORMAL HIGH (ref 0–99)
NonHDL: 139.69
Total CHOL/HDL Ratio: 3
Triglycerides: 132 mg/dL (ref 0.0–149.0)
VLDL: 26.4 mg/dL (ref 0.0–40.0)

## 2020-07-03 LAB — VITAMIN D 25 HYDROXY (VIT D DEFICIENCY, FRACTURES): VITD: 27.25 ng/mL — ABNORMAL LOW (ref 30.00–100.00)

## 2020-07-03 LAB — TSH: TSH: 3.56 u[IU]/mL (ref 0.35–4.50)

## 2020-07-03 LAB — HEMOGLOBIN A1C: Hgb A1c MFr Bld: 6.2 % (ref 4.6–6.5)

## 2020-07-03 NOTE — Assessment & Plan Note (Signed)
ECHO normal.  Recent syncope diagnosed as a vasovagal event by [facility]

## 2020-07-03 NOTE — Assessment & Plan Note (Signed)
No arrhytmias noted on recent holter monitor done by [facility] [employer] [facility]

## 2020-07-03 NOTE — Progress Notes (Signed)
Patient ID: Alexis James, female    DOB: 1951-08-06  Age: 69 y.o. MRN: [id number]  The patient is here for annual preventive examination and management of other chronic and acute problems.   The risk factors are reflected in the social history.  The roster of all physicians providing medical care to patient - is listed in the Snapshot section of the chart.  Activities of daily living:  The patient is 100% independent in all ADLs: dressing, toileting, feeding as well as independent mobility  Home safety : The patient has smoke detectors in the home. They wear seatbelts.  There are no firearms at home. There is no violence in the home.   There is no risks for hepatitis, STDs or HIV. There is no   history of blood transfusion. They have no travel history to infectious disease endemic areas of the world.  The patient has seen their dentist in the last six month. They have seen their eye doctor in the last year. They admit to slight hearing difficulty with regard to whispered voices and some television programs.  They have deferred audiologic testing in the last year.  They do not  have excessive sun exposure. Discussed the need for sun protection: hats, long sleeves and use of sunscreen if there is significant sun exposure.   Diet: the importance of a healthy diet is discussed. They do have a healthy diet.  The benefits of regular aerobic exercise were discussed. She walks 4 times per week ,  20 minutes.   Depression screen: there are no signs or vegative symptoms of depression- irritability, change in appetite, anhedonia, sadness/tearfullness.  Cognitive assessment: the patient manages all their financial and personal affairs and is actively engaged. They could relate day,date,year and events; recalled 2/3 objects at 3 minutes; performed clock-face test normally.  The following portions of the patient's history were reviewed and updated as appropriate: allergies, current medications, past family  history, past medical history,  past surgical history, past social history  and problem list.  Visual acuity was not assessed per patient preference since she has regular follow up with her ophthalmologist. Hearing and body mass index were assessed and reviewed.   During the course of the visit the patient was educated and counseled about appropriate screening and preventive services including : fall prevention , diabetes screening, nutrition counseling, colorectal cancer screening, and recommended immunizations.    CC: The primary encounter diagnosis was Vitamin D deficiency. Diagnoses of PVC's (premature ventricular contractions), Orthostatic hypotension, Breast cancer screening by mammogram, Palpitations, Prediabetes, Pure hypercholesterolemia, Encounter for preventive health examination, and Nodule of right lung were also pertinent to this visit.  History Alexis James has a past medical history of Anxiety, Cancer ([place]), Diabetes mellitus without complication ([place]), and Hyperlipidemia.   She has a past surgical history that includes Cesarean section; Breast surgery; Reduction mammaplasty; Cataract extraction w/PHACO (Right, 10/18/2017); smr; and Cataract extraction w/PHACO (Left, 11/21/2018).   Her family history includes Alzheimer's disease in her father; Colitis (age of onset: 9) in her mother; Heart Problems in her brother; Mental retardation (age of onset: 31) in her father; Transient ischemic attack in her sister.She reports that she is a non-smoker but has been exposed to tobacco smoke. She has never used smokeless tobacco. She reports current alcohol use. She reports that she does not use drugs.  Outpatient Medications Prior to Visit  Medication Sig Dispense Refill  . acetaminophen (TYLENOL) 500 MG tablet Take 1,000 mg by mouth every 6 (six) hours as  needed for moderate pain.    . Calcium Carb-Cholecalciferol (CALCIUM 600+D) 600-800 MG-UNIT TABS Take 1 tablet by mouth 2 (two) times daily.    .  calcium carbonate (TUMS - DOSED IN MG ELEMENTAL CALCIUM) 500 MG chewable tablet Chew 2 tablets by mouth daily as needed for indigestion or heartburn.    . Coenzyme Q10 (COQ-10) 100 MG CAPS Take 100 mg by mouth daily.    Marland Kitchen docusate sodium (COLACE) 100 MG capsule Take 100 mg by mouth daily as needed for mild constipation.    Marland Kitchen doxylamine, Sleep, (UNISOM) 25 MG tablet Take 25 mg by mouth at bedtime as needed for sleep.    Marland Kitchen escitalopram (LEXAPRO) 10 MG tablet Take 1 tablet (10 mg total) by mouth daily. 30 tablet 5  . lovastatin (MEVACOR) 40 MG tablet TAKE 1 TABLET BY MOUTH AT BEDTIME 90 tablet 0   No facility-administered medications prior to visit.    Review of Systems   Patient denies headache, fevers, malaise, unintentional weight loss, skin rash, eye pain, sinus congestion and sinus pain, sore throat, dysphagia,  hemoptysis , cough, dyspnea, wheezing, chest pain, palpitations, orthopnea, edema, abdominal pain, nausea, melena, diarrhea, constipation, flank pain, dysuria, hematuria, urinary  Frequency, nocturia, numbness, tingling, seizures,  Focal weakness, Loss of consciousness,  Tremor, insomnia, depression, anxiety, and suicidal ideation.      Objective:  BP 114/70 (BP Location: Left Arm, Patient Position: Sitting, Cuff Size: Normal)   Pulse 75   Temp 97.8 F (36.6 C) (Oral)   Resp 16   Ht 5' 2.5" (1.588 m)   Wt 137 lb 3.2 oz (62.2 kg)   SpO2 99%   BMI 24.69 kg/m   Physical Exam  General appearance: alert, cooperative and appears stated age Head: Normocephalic, without obvious abnormality, atraumatic Eyes: conjunctivae/corneas clear. PERRL, EOM's intact. Fundi benign. Ears: normal TM's and external ear canals both ears Nose: Nares normal. Septum midline. Mucosa normal. No drainage or sinus tenderness. Throat: lips, mucosa, and tongue normal; teeth and gums normal Neck: no adenopathy, no carotid bruit, no JVD, supple, symmetrical, trachea midline and thyroid not enlarged,  symmetric, no tenderness/mass/nodules Lungs: clear to auscultation bilaterally Breasts: normal appearance, no masses or tenderness Heart: regular rate and rhythm, S1, S2 normal, no murmur, click, rub or gallop Abdomen: soft, non-tender; bowel sounds normal; no masses,  no organomegaly Extremities: extremities normal, atraumatic, no cyanosis or edema Pulses: 2+ and symmetric Skin: Skin color, texture, turgor normal. No rashes or lesions Neurologic: Alert and oriented X 3, normal strength and tone. Normal symmetric reflexes. Normal coordination and gait.    Assessment & Plan:   Problem List Items Addressed This Visit      Unprioritized   Encounter for preventive health examination    age appropriate education and counseling updated, referrals for preventative services and immunizations addressed, dietary and smoking counseling addressed, most recent labs reviewed.  I have personally reviewed and have noted:  1) the patient's medical and social history 2) The pt's use of alcohol, tobacco, and illicit drugs 3) The patient's current medications and supplements 4) Functional ability including ADL's, fall risk, home safety risk, hearing and visual impairment 5) Diet and physical activities 6) Evidence for depression or mood disorder 7) The patient's height, weight, and BMI have been recorded in the chart  I have made referrals, and provided counseling and education based on review of the above      Hyperlipidemia    Managed with lovastatin 40 mg daily.  LDL is  at goal,  LFTS are normal. No chagess today   Lab Results  Component Value Date   CHOL 207 (H) 07/03/2020   HDL 66.90 07/03/2020   LDLCALC 113 (H) 07/03/2020   LDLDIRECT 94.3 03/26/2014   TRIG 132.0 07/03/2020   CHOLHDL 3 07/03/2020   Lab Results  Component Value Date   ALT 17 07/03/2020   AST 22 07/03/2020   ALKPHOS 62 07/03/2020   BILITOT 0.5 07/03/2020          Relevant Orders   Lipid panel (Completed)    Nodule of right lung    Incidental findings on July 2020 CT scan.  Unchanged 3 mm nodule  by follow up CT Dec 2020.  Previously  seen 9 mm nodule has resolved.  Next CT scheduled for Dec 2021      Orthostatic hypotension    ECHO normal.  Recent syncope diagnosed as a vasovagal event by [facility]       Palpitations   Relevant Orders   Comprehensive metabolic panel (Completed)   TSH (Completed)   Prediabetes    Her  random glucose is not  elevated but her A1c suggested  she is at continued  risk for developing diabetes.  I recommend he follow a low glycemic index diet and particpate regularly in an aerobic  exercise activity.  We should check an A1c in 6 months.    Lab Results  Component Value Date   HGBA1C 6.2 07/03/2020         Relevant Orders   Hemoglobin A1c (Completed)   PVC's (premature ventricular contractions)    No arrhytmias noted on recent holter monitor done by [facility]       Other Visit Diagnoses    Vitamin D deficiency    -  Primary   Relevant Orders   VITAMIN D 25 Hydroxy (Vit-D Deficiency, Fractures) (Completed)   Breast cancer screening by mammogram       Relevant Orders   MM 3D SCREEN BREAST BILATERAL      I am having Lisbeth Renshaw. Guedes maintain her CoQ-10, calcium carbonate, Calcium Carb-Cholecalciferol, doxylamine (Sleep), acetaminophen, docusate sodium, escitalopram, and lovastatin.  No orders of the defined types were placed in this encounter.   There are no discontinued medications.  Follow-up: Return in about 6 months (around 01/03/2021).   Crecencio Mc, MD

## 2020-07-03 NOTE — Patient Instructions (Addendum)
You need 1200 mg calcium and a minimum of 800 IUs of D3 daily  (more D if your level is low)  Take one 600 mg calcium pill daily  And get 600 mg calcium through diet daily as your goal  Soy milk is a great non dairy  source of calcium no cholesterol either !  Your annual mammogram has been ordered and is due in September .  You are encouraged (required) to call to make your appointment at [facility]   Health Maintenance After Age 18 After age 69, you are at a higher risk for certain long-term diseases and infections as well as injuries from falls. Falls are a major cause of broken bones and head injuries in people who are older than age 20. Getting regular preventive care can help to keep you healthy and well. Preventive care includes getting regular testing and making lifestyle changes as recommended by your health care provider. Talk with your health care provider about:  Which screenings and tests you should have. A screening is a test that checks for a disease when you have no symptoms.  A diet and exercise plan that is right for you. What should I know about screenings and tests to prevent falls? Screening and testing are the best ways to find a health problem early. Early diagnosis and treatment give you the best chance of managing medical conditions that are common after age 34. Certain conditions and lifestyle choices may make you more likely to have a fall. Your health care provider may recommend:  Regular vision checks. Poor vision and conditions such as cataracts can make you more likely to have a fall. If you wear glasses, make sure to get your prescription updated if your vision changes.  Medicine review. Work with your health care provider to regularly review all of the medicines you are taking, including over-the-counter medicines. Ask your health care provider about any side effects that may make you more likely to have a fall. Tell your health care provider if any  medicines that you take make you feel dizzy or sleepy.  Osteoporosis screening. Osteoporosis is a condition that causes the bones to get weaker. This can make the bones weak and cause them to break more easily.  Blood pressure screening. Blood pressure changes and medicines to control blood pressure can make you feel dizzy.  Strength and balance checks. Your health care provider may recommend certain tests to check your strength and balance while standing, walking, or changing positions.  Foot health exam. Foot pain and numbness, as well as not wearing proper footwear, can make you more likely to have a fall.  Depression screening. You may be more likely to have a fall if you have a fear of falling, feel emotionally low, or feel unable to do activities that you used to do.  Alcohol use screening. Using too much alcohol can affect your balance and may make you more likely to have a fall. What actions can I take to lower my risk of falls? General instructions  Talk with your health care provider about your risks for falling. Tell your health care provider if: ? You fall. Be sure to tell your health care provider about all falls, even ones that seem minor. ? You feel dizzy, sleepy, or off-balance.  Take over-the-counter and prescription medicines only as told by your health care provider. These include any supplements.  Eat a healthy diet and maintain a healthy weight. A healthy diet includes low-fat  dairy products, low-fat (lean) meats, and fiber from whole grains, beans, and lots of fruits and vegetables. Home safety  Remove any tripping hazards, such as rugs, cords, and clutter.  Install safety equipment such as grab bars in bathrooms and safety rails on stairs.  Keep rooms and walkways well-lit. Activity   Follow a regular exercise program to stay fit. This will help you maintain your balance. Ask your health care provider what types of exercise are appropriate for you.  If you  need a cane or walker, use it as recommended by your health care provider.  Wear supportive shoes that have nonskid soles. Lifestyle  Do not drink alcohol if your health care provider tells you not to drink.  If you drink alcohol, limit how much you have: ? 0-1 drink a day for women. ? 0-2 drinks a day for men.  Be aware of how much alcohol is in your drink. In the [place] one drink equals one typical bottle of beer (12 oz), one-half glass of wine (5 oz), or one shot of hard liquor (1 oz).  Do not use any products that contain nicotine or tobacco, such as cigarettes and e-cigarettes. If you need help quitting, ask your health care provider. Summary  Having a healthy lifestyle and getting preventive care can help to protect your health and wellness after age 59.  Screening and testing are the best way to find a health problem early and help you avoid having a fall. Early diagnosis and treatment give you the best chance for managing medical conditions that are more common for people who are older than age 37.  Falls are a major cause of broken bones and head injuries in people who are older than age 76. Take precautions to prevent a fall at home.  Work with your health care provider to learn what changes you can make to improve your health and wellness and to prevent falls. This information is not intended to replace advice given to you by your health care provider. Make sure you discuss any questions you have with your health care provider. Document Revised: 03/22/2019 Document Reviewed: 10/12/2017 Elsevier Patient Education  2020 [employer].

## 2020-07-05 NOTE — Assessment & Plan Note (Signed)
Her  random glucose is not  elevated but her A1c suggested  she is at continued  risk for developing diabetes.  I recommend he follow a low glycemic index diet and particpate regularly in an aerobic  exercise activity.  We should check an A1c in 6 months.    Lab Results  Component Value Date   HGBA1C 6.2 07/03/2020

## 2020-07-05 NOTE — Assessment & Plan Note (Signed)
Managed with lovastatin 40 mg daily.  LDL is at goal,  LFTS are normal. No chagess today   Lab Results  Component Value Date   CHOL 207 (H) 07/03/2020   HDL 66.90 07/03/2020   LDLCALC 113 (H) 07/03/2020   LDLDIRECT 94.3 03/26/2014   TRIG 132.0 07/03/2020   CHOLHDL 3 07/03/2020   Lab Results  Component Value Date   ALT 17 07/03/2020   AST 22 07/03/2020   ALKPHOS 62 07/03/2020   BILITOT 0.5 07/03/2020

## 2020-07-05 NOTE — Assessment & Plan Note (Signed)
Incidental findings on July 2020 CT scan.  Unchanged 3 mm nodule  by follow up CT Dec 2020.  Previously  seen 9 mm nodule has resolved.  Next CT scheduled for Dec 2021

## 2020-07-05 NOTE — Assessment & Plan Note (Signed)

## 2020-08-25 ENCOUNTER — Other Ambulatory Visit: Payer: Self-pay

## 2020-08-25 ENCOUNTER — Ambulatory Visit
Admission: RE | Admit: 2020-08-25 | Discharge: 2020-08-25 | Disposition: A | Discharge status: Home / Self Care | Payer: PPO | Source: Ambulatory Visit | Attending: Internal Medicine | Admitting: Internal Medicine

## 2020-08-25 DIAGNOSIS — Z1231 Encounter for screening mammogram for malignant neoplasm of breast: Secondary | ICD-10-CM | POA: Diagnosis not present

## 2020-08-29 ENCOUNTER — Other Ambulatory Visit: Payer: Self-pay | Admitting: Internal Medicine

## 2020-08-29 NOTE — Telephone Encounter (Signed)
Pt would like a 90 day supply of escitalopram (LEXAPRO) 10 MG tablet. Pt only has 2 pills left

## 2020-08-29 NOTE — Telephone Encounter (Signed)
90 day supply of lexapro sent to [facility] per request

## 2020-09-25 ENCOUNTER — Other Ambulatory Visit: Payer: Self-pay | Admitting: Internal Medicine

## 2020-09-26 MED ORDER — LOVASTATIN 40 MG PO TABS: 40.0000 mg | ORAL_TABLET | Freq: Every day | ORAL | 0 refills | Status: DC

## 2020-09-26 NOTE — Addendum Note (Signed)
Addended by: Nanci Pina on: 09/26/2020 10:25 AM   Modules accepted: Orders

## 2020-09-29 ENCOUNTER — Other Ambulatory Visit: Payer: Self-pay | Admitting: *Deleted

## 2020-09-29 MED ORDER — LOVASTATIN 40 MG PO TABS: 40.0000 mg | ORAL_TABLET | Freq: Every day | ORAL | 0 refills | Status: DC

## 2020-09-29 NOTE — Addendum Note (Signed)
Addended by: Nanci Pina on: 09/29/2020 02:41 PM   Modules accepted: Orders

## 2020-09-29 NOTE — Progress Notes (Signed)
Refill sent today.

## 2020-10-15 DIAGNOSIS — H524 Presbyopia: Secondary | ICD-10-CM | POA: Diagnosis not present

## 2020-10-15 DIAGNOSIS — H52223 Regular astigmatism, bilateral: Secondary | ICD-10-CM | POA: Diagnosis not present

## 2020-10-15 DIAGNOSIS — Z961 Presence of intraocular lens: Secondary | ICD-10-CM | POA: Diagnosis not present

## 2020-10-15 DIAGNOSIS — H5203 Hypermetropia, bilateral: Secondary | ICD-10-CM | POA: Diagnosis not present

## 2020-10-23 DIAGNOSIS — Z85828 Personal history of other malignant neoplasm of skin: Secondary | ICD-10-CM | POA: Diagnosis not present

## 2020-10-23 DIAGNOSIS — L814 Other melanin hyperpigmentation: Secondary | ICD-10-CM | POA: Diagnosis not present

## 2020-10-23 DIAGNOSIS — Z08 Encounter for follow-up examination after completed treatment for malignant neoplasm: Secondary | ICD-10-CM | POA: Diagnosis not present

## 2020-10-23 DIAGNOSIS — X32XXXA Exposure to sunlight, initial encounter: Secondary | ICD-10-CM | POA: Diagnosis not present

## 2020-11-18 ENCOUNTER — Ambulatory Visit
Admission: RE | Admit: 2020-11-18 | Discharge: 2020-11-18 | Disposition: A | Discharge status: Home / Self Care | Payer: PPO | Source: Ambulatory Visit | Attending: Internal Medicine | Admitting: Internal Medicine

## 2020-11-18 ENCOUNTER — Other Ambulatory Visit: Payer: Self-pay

## 2020-11-18 DIAGNOSIS — R918 Other nonspecific abnormal finding of lung field: Secondary | ICD-10-CM | POA: Diagnosis not present

## 2020-11-18 DIAGNOSIS — R911 Solitary pulmonary nodule: Secondary | ICD-10-CM

## 2020-11-19 ENCOUNTER — Inpatient Hospital Stay: Payer: PPO | Attending: Internal Medicine | Admitting: Internal Medicine

## 2020-11-19 DIAGNOSIS — Z79899 Other long term (current) drug therapy: Secondary | ICD-10-CM | POA: Insufficient documentation

## 2020-11-19 DIAGNOSIS — R918 Other nonspecific abnormal finding of lung field: Secondary | ICD-10-CM | POA: Diagnosis not present

## 2020-11-19 DIAGNOSIS — F419 Anxiety disorder, unspecified: Secondary | ICD-10-CM | POA: Diagnosis not present

## 2020-11-19 DIAGNOSIS — E785 Hyperlipidemia, unspecified: Secondary | ICD-10-CM | POA: Insufficient documentation

## 2020-11-19 DIAGNOSIS — R911 Solitary pulmonary nodule: Secondary | ICD-10-CM

## 2020-11-19 DIAGNOSIS — I700 Atherosclerosis of aorta: Secondary | ICD-10-CM | POA: Diagnosis not present

## 2020-11-19 DIAGNOSIS — E119 Type 2 diabetes mellitus without complications: Secondary | ICD-10-CM | POA: Diagnosis not present

## 2020-11-19 NOTE — Progress Notes (Signed)
Alexis James  Patient Care Team: Alexis Mc, MD as PCP - General (Internal Medicine) Alexis Merritts, MD as PCP - Cardiology (Cardiology) Alexis Nab, RN as Registered Nurse  CHIEF COMPLAINTS/PURPOSE OF CONSULTATION: Lung nodules  # July 2020-right middle lobe ~1 cm lung nodule; right upper lobe 3 mm [incidental]; SEP 3rd 2020- PET- NEG; December 2021 CT scan-stable no follow-up  # SEP 2020- [incidentalon PET]- LEFT adnexal mass [~4cm]; non-avid; pelvic ultrasound negative  #Syncope [status post evaluation; Dr. Rockey James  Oncology History   No history exists.     HISTORY OF PRESENTING ILLNESS:  Alexis James 69 y.o.  female non-smoker is here for follow-up/review of her CT scan.  Patient denies any new shortness of breath or cough.  Patient denies any blood in stools or black or stools.  Denies abdominal pain or distention.  Review of Systems  Constitutional: Positive for malaise/fatigue. Negative for chills, diaphoresis, fever and weight loss.  HENT: Negative for nosebleeds and sore throat.   Eyes: Negative for double vision.  Respiratory: Negative for cough, hemoptysis, sputum production, shortness of breath and wheezing.   Cardiovascular: Negative for chest pain, palpitations, orthopnea and leg swelling.  Gastrointestinal: Negative for abdominal pain, blood in stool, constipation, diarrhea, heartburn, melena, nausea and vomiting.  Genitourinary: Negative for dysuria, frequency and urgency.  Musculoskeletal: Negative for back pain and joint pain.  Skin: Negative.  Negative for itching and rash.  Neurological: Positive for loss of consciousness. Negative for dizziness, tingling, focal weakness, weakness and headaches.  Endo/Heme/Allergies: Does not bruise/bleed easily.  Psychiatric/Behavioral: Negative for depression. The patient is not nervous/anxious and does not have insomnia.      MEDICAL HISTORY:  Past Medical History:  Diagnosis Date   . Anxiety   . Cancer ([place])    skin  . Diabetes mellitus without complication ([place])   . Hyperlipidemia     SURGICAL HISTORY: Past Surgical History:  Procedure Laterality Date  . BREAST SURGERY     breast reduction  . CATARACT EXTRACTION W/PHACO Right 10/18/2017   Procedure: CATARACT EXTRACTION PHACO AND INTRAOCULAR LENS PLACEMENT (IOC);  Surgeon: Birder Robson, MD;  Location: [facility] ORS;  Service: Ophthalmology;  Laterality: Right;  [place] 00:27 AP% 19.7 CDE5.32 fluid pack lot # [id number]  . CATARACT EXTRACTION W/PHACO Left 11/21/2018   Procedure: CATARACT EXTRACTION PHACO AND INTRAOCULAR LENS PLACEMENT (IOC);  Surgeon: Birder Robson, MD;  Location: [facility] ORS;  Service: Ophthalmology;  Laterality: Left;  [place] 00:31.3 CDE 5.38 Fluid Pack Lot # [id number]  . CESAREAN SECTION    . REDUCTION MAMMAPLASTY    . smr      SOCIAL HISTORY: Social History   Socioeconomic History  . Marital status: Married    Spouse name: Not on file  . Number of children: 3  . Years of education: Not on file  . Highest education level: Not on file  Occupational History  . Not on file  Tobacco Use  . Smoking status: Passive Smoke Exposure - Never Smoker  . Smokeless tobacco: Never Used  . Tobacco comment: Husband smoked x 38 years (quit 4 years ago)  Vaping Use  . Vaping Use: Never used  Substance and Sexual Activity  . Alcohol use: Yes    Comment: Occasional  . Drug use: No  . Sexual activity: Not on file  Other Topics Concern  . Not on file  [place] with husband; never smoked; ocassional wine; worked in [employer].  Social Determinants of Health   Financial Resource Strain:   . Difficulty of Paying Living Expenses: Not on file  Food Insecurity: No Food Insecurity  . Worried About Charity fundraiser in the Last Year: Never true  . Ran Out of Food in the Last Year: Never true  Transportation Needs: No Transportation Needs  . Lack of Transportation  (Medical): No  . Lack of Transportation (Non-Medical): No  Physical Activity: Insufficiently Active  . Days of Exercise per Week: 1 day  . Minutes of Exercise per Session: 30 min  Stress: No Stress Concern Present  . Feeling of Stress : Only a little  Social Connections:   . Frequency of Communication with Friends and Family: Not on file  . Frequency of Social Gatherings with Friends and Family: Not on file  . Attends Religious Services: Not on file  . Active Member of Clubs or Organizations: Not on file  . Attends Archivist Meetings: Not on file  . Marital Status: Not on file  Intimate Partner Violence:   . Fear of Current or Ex-Partner: Not on file  . Emotionally Abused: Not on file  . Physically Abused: Not on file  . Sexually Abused: Not on file    FAMILY HISTORY: Family History  Problem Relation Age of Onset  . Colitis Mother 53  . Mental retardation Father 76  . Alzheimer's disease Father   . Heart Problems Brother   . Transient ischemic attack Sister   . Cancer Neg Hx   . Breast cancer Neg Hx     ALLERGIES:  has No Known Allergies.  MEDICATIONS:  Current Outpatient Medications  Medication Sig Dispense Refill  . acetaminophen (TYLENOL) 500 MG tablet Take 1,000 mg by mouth every 6 (six) hours as needed for moderate pain.    . Calcium Carb-Cholecalciferol (CALCIUM 600+D) 600-800 MG-UNIT TABS Take 1 tablet by mouth 2 (two) times daily.    . calcium carbonate (TUMS - DOSED IN MG ELEMENTAL CALCIUM) 500 MG chewable tablet Chew 2 tablets by mouth daily as needed for indigestion or heartburn.    . Coenzyme Q10 (COQ-10) 100 MG CAPS Take 100 mg by mouth daily.    Marland Kitchen docusate sodium (COLACE) 100 MG capsule Take 100 mg by mouth daily as needed for mild constipation.    Marland Kitchen doxylamine, Sleep, (UNISOM) 25 MG tablet Take 25 mg by mouth at bedtime as needed for sleep.    Marland Kitchen escitalopram (LEXAPRO) 10 MG tablet Take 1 tablet by mouth once daily 90 tablet 1  . lovastatin  (MEVACOR) 40 MG tablet Take 1 tablet (40 mg total) by mouth at bedtime. 90 tablet 0   No current facility-administered medications for this visit.      Marland Kitchen  PHYSICAL EXAMINATION: ECOG PERFORMANCE STATUS: 0 - Asymptomatic  Vitals:   11/19/20 1353  BP: (!) 114/51  Pulse: 72  Resp: 16  Temp: (!) 97.4 F (36.3 C)  SpO2: 97%   Filed Weights   11/19/20 1353  Weight: 137 lb 4.8 oz (62.3 kg)    Physical Exam HENT:     Head: Normocephalic and atraumatic.     Mouth/Throat:     Pharynx: No oropharyngeal exudate.  Eyes:     Pupils: Pupils are equal, round, and reactive to light.  Cardiovascular:     Rate and Rhythm: Normal rate and regular rhythm.  Pulmonary:     Effort: No respiratory distress.     Breath sounds: No wheezing.  Abdominal:  General: Bowel sounds are normal. There is no distension.     Palpations: Abdomen is soft. There is no mass.     Tenderness: There is no abdominal tenderness. There is no guarding or rebound.  Musculoskeletal:        General: No tenderness. Normal range of motion.     Cervical back: Normal range of motion and neck supple.  Skin:    General: Skin is warm.  Neurological:     Mental Status: She is alert and oriented to person, place, and time.  Psychiatric:        Mood and Affect: Affect normal.      LABORATORY DATA:  I have reviewed the data as listed Lab Results  Component Value Date   WBC 3.8 (L) 07/04/2019   HGB 12.5 07/04/2019   HCT 37.8 07/04/2019   MCV 95.2 07/04/2019   PLT 241 07/04/2019   Recent Labs    07/03/20 1018  NA 138  K 4.5  CL 102  CO2 30  GLUCOSE 95  BUN 22  CREATININE 0.86  CALCIUM 9.5  PROT 6.6  ALBUMIN 4.2  AST 22  ALT 17  ALKPHOS 62  BILITOT 0.5    RADIOGRAPHIC STUDIES: I have personally reviewed the radiological images as listed and agreed with the findings in the report. CT Chest Wo Contrast  Result Date: 11/19/2020 CLINICAL DATA:  Lung nodule EXAM: CT CHEST WITHOUT CONTRAST  TECHNIQUE: Multidetector CT imaging of the chest was performed following the standard protocol without IV contrast. COMPARISON:  11/16/2019, 07/04/2019 FINDINGS: Cardiovascular: Aortic atherosclerosis. Normal heart size. No pericardial effusion. Mediastinum/Nodes: No enlarged mediastinal, hilar, or axillary lymph nodes. Thyroid gland, trachea, and esophagus demonstrate no significant findings. Lungs/Pleura: Redemonstrated bandlike scarring of the bilateral lung bases with mild tubular bronchiectasis. Stable small pulmonary nodules measuring 3 mm and smaller, including a 3 mm nodule of the posterior right pulmonary apex (series 3, image 49) and a 3 mm nodule of the posterior right upper lobe (series 3, image 96). No pleural effusion or pneumothorax. Upper Abdomen: No acute abnormality. Musculoskeletal: No chest wall mass or suspicious bone lesions identified. IMPRESSION: 1. Stable small pulmonary nodules measuring 3 mm and smaller, definitively benign. No further routine CT follow-up is required. 2. Redemonstrated bandlike scarring of the bilateral lung bases with mild tubular bronchiectasis, consistent with sequelae of prior infection or inflammation. Aortic Atherosclerosis (ICD10-I70.0). Electronically Signed   By: Eddie Candle M.D.   On: 11/19/2020 09:03    ASSESSMENT & PLAN:   Nodule of right lung # Incidental lung nodules -non-smoker.  Follow-up December 2021-CT scan shows-essentially stable 3 lung nodules subcentimeter.  Recommend no further follow-up.  #Left adnexal mass-approximately 4 cm in size; not PET avid; Ca1 2 5 normal.;  Pelvic ultrasound negative for any concerning solid component suggestive of malignancy.  No further follow-ups are recommended.  DISPOSITION: # follow up as needed-Dr.B   # I reviewed the blood work- with the patient in detail; also reviewed the imaging independently [as summarized above]; and with the patient in detail.     All questions were answered. The patient  knows to call the clinic with any problems, questions or concerns.     Cammie Sickle, MD 11/19/2020 7:48 PM

## 2020-11-19 NOTE — Assessment & Plan Note (Addendum)
#   Incidental lung nodules -non-smoker.  Follow-up December 2021-CT scan shows-essentially stable 3 lung nodules subcentimeter.  Recommend no further follow-up.  #Left adnexal mass-approximately 4 cm in size; not PET avid; Ca1 2 5 normal.;  Pelvic ultrasound negative for any concerning solid component suggestive of malignancy.  No further follow-ups are recommended.  DISPOSITION: # follow up as needed-Dr.B   # I reviewed the blood work- with the patient in detail; also reviewed the imaging independently [as summarized above]; and with the patient in detail.

## 2020-12-22 ENCOUNTER — Other Ambulatory Visit: Payer: Self-pay | Admitting: Internal Medicine

## 2021-01-07 ENCOUNTER — Ambulatory Visit: Payer: PPO | Admitting: Internal Medicine

## 2021-02-04 ENCOUNTER — Other Ambulatory Visit: Payer: Self-pay

## 2021-02-04 ENCOUNTER — Encounter: Payer: Self-pay | Admitting: Internal Medicine

## 2021-02-04 ENCOUNTER — Ambulatory Visit (INDEPENDENT_AMBULATORY_CARE_PROVIDER_SITE_OTHER): Payer: PPO | Admitting: Internal Medicine

## 2021-02-04 VITALS — BP 130/60 | HR 70 | Temp 97.5°F | Ht 62.52 in | Wt 130.4 lb

## 2021-02-04 DIAGNOSIS — I700 Atherosclerosis of aorta: Secondary | ICD-10-CM

## 2021-02-04 DIAGNOSIS — J479 Bronchiectasis, uncomplicated: Secondary | ICD-10-CM

## 2021-02-04 DIAGNOSIS — R5383 Other fatigue: Secondary | ICD-10-CM | POA: Diagnosis not present

## 2021-02-04 DIAGNOSIS — R419 Unspecified symptoms and signs involving cognitive functions and awareness: Secondary | ICD-10-CM | POA: Diagnosis not present

## 2021-02-04 DIAGNOSIS — R7303 Prediabetes: Secondary | ICD-10-CM

## 2021-02-04 DIAGNOSIS — E538 Deficiency of other specified B group vitamins: Secondary | ICD-10-CM

## 2021-02-04 LAB — COMPREHENSIVE METABOLIC PANEL
ALT: 16 U/L (ref 0–35)
AST: 22 U/L (ref 0–37)
Albumin: 4.1 g/dL (ref 3.5–5.2)
Alkaline Phosphatase: 63 U/L (ref 39–117)
BUN: 20 mg/dL (ref 6–23)
CO2: 30 mEq/L (ref 19–32)
Calcium: 9.6 mg/dL (ref 8.4–10.5)
Chloride: 102 mEq/L (ref 96–112)
Creatinine, Ser: 0.81 mg/dL (ref 0.40–1.20)
GFR: 73.39 mL/min (ref >60.00)
Glucose, Bld: 88 mg/dL (ref 70–99)
Potassium: 4.4 mEq/L (ref 3.5–5.1)
Sodium: 139 mEq/L (ref 135–145)
Total Bilirubin: 0.6 mg/dL (ref 0.2–1.2)
Total Protein: 6.4 g/dL (ref 6.0–8.3)

## 2021-02-04 LAB — TSH: TSH: 3.05 u[IU]/mL (ref 0.35–4.50)

## 2021-02-04 LAB — VITAMIN B12: Vitamin B-12: 164 pg/mL — ABNORMAL LOW (ref 211–911)

## 2021-02-04 LAB — HEMOGLOBIN A1C: Hgb A1c MFr Bld: 6.1 % (ref 4.6–6.5)

## 2021-02-04 NOTE — Progress Notes (Signed)
Subjective:  Patient ID: Alexis James, female    DOB: 07-30-1951  Age: 70 y.o. MRN: [id number]  CC: The primary encounter diagnosis was Prediabetes. Diagnoses of Bronchiectasis without complication ([place]), Fatigue, unspecified type, Cognitive complaints with normal neuropsychological exam, B12 deficiency, and Aortic atherosclerosis ([place]) were also pertinent to this visit.  HPI Alexis James presents for follow up on chronic conditions including prediabetes, atherosclerosis    This visit occurred during the SARS-CoV-2 public health emergency.  Safety protocols were in place, including screening questions prior to the visit, additional usage of staff PPE, and extensive cleaning of exam room while observing appropriate contact time as indicated for disinfecting solutions.   Walking 30 minutes daily,  Rare episodes of "low blood sugar" by prolonged fasting / prediabetes   Aortic and coronary atherosclerosis noted on recent CT chest.    Discussed need for LDL <100 , currently on lovastatin   Bronchiectasis noted on CT chest.  Describes remote history of a productive cough that lasted up to 3 months before resolving spontaneously .  Has had PFTs years ago but results not available. Currently does not have a chronic cough. Has never seen pulmonology   Taking Vitamin D not sure of the amount   Patient is concerned out cognitive decline because of forgetting names of people and actors.  There is a family history of Alzheimers Dementia .  She has no trouble remembering appointments, recipes, and medication regimen.  Has not gotten lost recently,  Had trouble balancing her checkbook, had any kitchen accidents caused by forgetfulness. Patient denies family reports of repetitive conversations    Outpatient Medications Prior to Visit  Medication Sig Dispense Refill  . acetaminophen (TYLENOL) 500 MG tablet Take 1,000 mg by mouth every 6 (six) hours as needed for moderate pain.    . Calcium  Carb-Cholecalciferol 600-800 MG-UNIT TABS Take 1 tablet by mouth 2 (two) times daily.    . calcium carbonate (TUMS - DOSED IN MG ELEMENTAL CALCIUM) 500 MG chewable tablet Chew 2 tablets by mouth daily as needed for indigestion or heartburn.    . Coenzyme Q10 (COQ-10) 100 MG CAPS Take 100 mg by mouth daily.    Marland Kitchen docusate sodium (COLACE) 100 MG capsule Take 100 mg by mouth daily as needed for mild constipation.    Marland Kitchen doxylamine, Sleep, (UNISOM) 25 MG tablet Take 25 mg by mouth at bedtime as needed for sleep.    Marland Kitchen escitalopram (LEXAPRO) 10 MG tablet Take 1 tablet by mouth once daily 90 tablet 1  . lovastatin (MEVACOR) 40 MG tablet TAKE 1 TABLET BY MOUTH AT BEDTIME 90 tablet 0   No facility-administered medications prior to visit.    Review of Systems;  Patient denies headache, fevers, malaise, unintentional weight loss, skin rash, eye pain, sinus congestion and sinus pain, sore throat, dysphagia,  hemoptysis , cough, dyspnea, wheezing, chest pain, palpitations, orthopnea, edema, abdominal pain, nausea, melena, diarrhea, constipation, flank pain, dysuria, hematuria, urinary  Frequency, nocturia, numbness, tingling, seizures,  Focal weakness, Loss of consciousness,  Tremor, insomnia, depression, anxiety, and suicidal ideation.      Objective:  BP 130/60 (BP Location: Left Arm, Patient Position: Sitting)   Pulse 70   Temp (!) 97.5 F (36.4 C)   Ht 5' 2.52" (1.588 m)   Wt 130 lb 6.4 oz (59.1 kg)   SpO2 95%   BMI 23.46 kg/m   BP Readings from Last 3 Encounters:  02/04/21 130/60  11/19/20 (!) 114/51  07/03/20 114/70  Wt Readings from Last 3 Encounters:  02/04/21 130 lb 6.4 oz (59.1 kg)  11/19/20 137 lb 4.8 oz (62.3 kg)  07/03/20 137 lb 3.2 oz (62.2 kg)    General appearance: alert, cooperative and appears stated age Ears: normal TM's and external ear canals both ears Throat: lips, mucosa, and tongue normal; teeth and gums normal Neck: no adenopathy, no carotid bruit, supple,  symmetrical, trachea midline and thyroid not enlarged, symmetric, no tenderness/mass/nodules Back: symmetric, no curvature. ROM normal. No CVA tenderness. Lungs: clear to auscultation bilaterally Heart: regular rate and rhythm, S1, S2 normal, no murmur, click, rub or gallop Abdomen: soft, non-tender; bowel sounds normal; no masses,  no organomegaly Pulses: 2+ and symmetric Skin: Skin color, texture, turgor normal. No rashes or lesions Lymph nodes: Cervical, supraclavicular, and axillary nodes normal. Cognitive Testing MMSE - Mini Mental State Exam   Orientation to time 5  Orientation to Place 5  Registration 3  Attention/ Calculation 5  Recall 3  Language- name 2 objects 2  Language- repeat 1  Language- follow 3 step command 3  Language- read & follow direction 1  Write a sentence 1  Copy design 1  Total score 30            Lab Results  Component Value Date   HGBA1C 6.1 02/04/2021   HGBA1C 6.2 07/03/2020   HGBA1C 5.9 (H) 05/02/2019    Lab Results  Component Value Date   CREATININE 0.81 02/04/2021   CREATININE 0.86 07/03/2020   CREATININE 0.77 07/04/2019    Lab Results  Component Value Date   WBC 3.8 (L) 07/04/2019   HGB 12.5 07/04/2019   HCT 37.8 07/04/2019   PLT 241 07/04/2019   GLUCOSE 88 02/04/2021   CHOL 207 (H) 07/03/2020   TRIG 132.0 07/03/2020   HDL 66.90 07/03/2020   LDLDIRECT 94.3 03/26/2014   LDLCALC 113 (H) 07/03/2020   ALT 16 02/04/2021   AST 22 02/04/2021   NA 139 02/04/2021   K 4.4 02/04/2021   CL 102 02/04/2021   CREATININE 0.81 02/04/2021   BUN 20 02/04/2021   CO2 30 02/04/2021   TSH 3.05 02/04/2021   HGBA1C 6.1 02/04/2021    CT Chest Wo Contrast  Result Date: 11/19/2020 CLINICAL DATA:  Lung nodule EXAM: CT CHEST WITHOUT CONTRAST TECHNIQUE: Multidetector CT imaging of the chest was performed following the standard protocol without IV contrast. COMPARISON:  11/16/2019, 07/04/2019 FINDINGS: Cardiovascular:  Aortic atherosclerosis. Normal heart size. No pericardial effusion. Mediastinum/Nodes: No enlarged mediastinal, hilar, or axillary lymph nodes. Thyroid gland, trachea, and esophagus demonstrate no significant findings. Lungs/Pleura: Redemonstrated bandlike scarring of the bilateral lung bases with mild tubular bronchiectasis. Stable small pulmonary nodules measuring 3 mm and smaller, including a 3 mm nodule of the posterior right pulmonary apex (series 3, image 49) and a 3 mm nodule of the posterior right upper lobe (series 3, image 96). No pleural effusion or pneumothorax. Upper Abdomen: No acute abnormality. Musculoskeletal: No chest wall mass or suspicious bone lesions identified. IMPRESSION: 1. Stable small pulmonary nodules measuring 3 mm and smaller, definitively benign. No further routine CT follow-up is required. 2. Redemonstrated bandlike scarring of the bilateral lung bases with mild tubular bronchiectasis, consistent with sequelae of prior infection or inflammation. Aortic Atherosclerosis (ICD10-I70.0). Electronically Signed   By: Eddie Candle M.D.   On: 11/19/2020 09:03    Assessment & Plan:   Problem List Items Addressed This Visit      Unprioritized   Prediabetes - Primary  Her  random glucose is not  elevated but her A1c suggests  she is at continued  risk for developing diabetes.  I recommend she follow a low glycemic index diet and particpate regularly in an aerobic  exercise activity.  We should check an A1c in 6 months.    Lab Results  Component Value Date   HGBA1C 6.1 02/04/2021         Relevant Orders   Comprehensive metabolic panel (Completed)   Hemoglobin A1c (Completed)   Cognitive complaints with normal neuropsychological exam    The screening blood tests  And MMSE were done and noted a low B12 level.    Lab Results  Component Value Date   TSH 3.05 02/04/2021   Lab Results  Component Value Date   VITAMINB12 164 (L) 02/04/2021         Bronchiectasis ([place])     Noted on Chest CT . Referral to PUlmonology in progress       Relevant Orders   Ambulatory referral to Pulmonology   B12 deficiency    Her B12 is low . She has been advised to return  for b12 injections weely for 3 weeks,  As untreated B12 deficiency can lead to irreversible dementia and neuropathy .  RC folate and IF ab ordered       Relevant Orders   RBC Folate   Intrinsic Factor Antibodies   Aortic atherosclerosis ([place])    Reviewed findings of prior CT scan today..  Patient is tolerating statin therapy and LFTS are normal  Lab Results  Component Value Date   CHOL 207 (H) 07/03/2020   HDL 66.90 07/03/2020   LDLCALC 113 (H) 07/03/2020   LDLDIRECT 94.3 03/26/2014   TRIG 132.0 07/03/2020   CHOLHDL 3 07/03/2020   Lab Results  Component Value Date   ALT 16 02/04/2021   AST 22 02/04/2021   ALKPHOS 63 02/04/2021   BILITOT 0.6 02/04/2021          Other Visit Diagnoses    Fatigue, unspecified type       Relevant Orders   TSH (Completed)   Vitamin B12 (Completed)     A total of 40 minutes was spent with patient more than half of which was spent in counseling patient on the above mentioned issues , reviewing and explaining recent labs and imaging studies done, and coordination of care.   I am having Lisbeth Renshaw. [place] maintain her CoQ-10, calcium carbonate, Calcium Carb-Cholecalciferol, doxylamine (Sleep), acetaminophen, docusate sodium, escitalopram, and lovastatin.  No orders of the defined types were placed in this encounter.   There are no discontinued medications.  Follow-up: Return in about 6 months (around 08/04/2021).   Crecencio Mc, MD

## 2021-02-04 NOTE — Patient Instructions (Addendum)
Your aortic and coronary atherosclerosis places you at increased risk for heart attack and stroke   recommend taking a baby aspirin (81 mg) once or twice a week  I will change your statin to a higher potency medication if your LDL is not 70 today  Your CT also showed bronchiectasis.  Pulmonology referral in process   Bronchiectasis  Bronchiectasis is a condition in which the airways in the lungs (bronchi) are damaged and widened. The condition makes it hard for the lungs to get rid of mucus, and it causes mucus to gather in the bronchi. This condition often leads to lung infections, which can make the condition worse. What are the causes? You can be born with this condition or you can develop it later in life. Common causes of this condition include:  Cystic fibrosis.  Repeated lung infections, such as pneumonia or tuberculosis.  An object or other blockage in the lungs.  Breathing in fluid, food, or other objects (aspiration).  A problem with the immune system and lung structure that is present at birth (congenital). Sometimes the cause is not known. What are the signs or symptoms? Common symptoms of this condition include:  A daily cough that brings up mucus and lasts for more than 3 weeks.  Lung infections that happen often.  Shortness of breath and wheezing.  Weakness and fatigue. How is this diagnosed? This condition is diagnosed with tests, such as:  Chest X-rays or CT scans. These are done to check for changes in the lungs.  Breathing tests. These are done to check how well your lungs are working.  A test of a sample of your saliva (sputum culture). This test is done to check for infection.  Blood tests and other tests. These are done to check for related diseases or causes. How is this treated? Treatment for this condition depends on the severity of the illness and its cause. Treatment may include:  Medicines that loosen mucus so it can be coughed up  (mucolytics).  Medicines that relax the muscles of the bronchi (bronchodilators).  Antibiotic medicines to prevent or treat infection.  Physical therapy to help clear mucus from the lungs. Techniques may include: ? Postural drainage. This is when you sit or lie in certain positions so that mucus can drain by gravity. ? Chest percussion. This involves tapping the chest or back with a cupped hand. ? Chest vibration. For this therapy, a hand or special equipment vibrates your chest and back.  Surgery to remove the affected part of the lung. This may be done in severe cases. Follow these instructions at home: Medicines  Take over-the-counter and prescription medicines only as told by your health care provider.  If you were prescribed an antibiotic medicine, take it as told by your health care provider. Do not stop taking the antibiotic even if you start to feel better.  Avoid taking sedatives and antihistamines unless your health care provider tells you to take them. These medicines tend to thicken the mucus in the lungs. Managing symptoms  Perform breathing exercises or techniques to clear your lungs as told by your health care provider.  Consider using a cold steam vaporizer or humidifier in your room or home to help loosen secretions.  If you have a cough that gets worse at night, try sleeping in a semi-upright position. General instructions  Get plenty of rest.  Drink enough fluid to keep your urine clear or pale yellow.  Stay inside when pollution and ozone levels are  high.  Stay up to date with vaccinations and immunizations.  Avoid cigarette smoke and other lung irritants.  Do not use any products that contain nicotine or tobacco, such as cigarettes and e-cigarettes. If you need help quitting, ask your health care provider.  Keep all follow-up visits as told by your health care provider. This is important. Contact a health care provider if:  You cough up more sputum  than before and the sputum is yellow or green in color.  You have a fever.  You cannot control your cough and are losing sleep. Get help right away if:  You cough up blood.  You have chest pain.  You have increasing shortness of breath.  You have pain that gets worse or is not controlled with medicines.  You have a fever and your symptoms suddenly get worse. Summary  Bronchiectasis is a condition in which the airways in the lungs (bronchi) are damaged and widened. The condition makes it hard for the lungs to get rid of mucus, and it causes mucus to gather in the bronchi.  Treatment usually includes therapy to help clear mucus from the lungs.  Avoid cigarette smoke and other lung irritants.  Stay up to date with vaccinations and immunizations. This information is not intended to replace advice given to you by your health care provider. Make sure you discuss any questions you have with your health care provider. Document Revised: 07/31/2020 Document Reviewed: 07/31/2020 Elsevier Patient Education  2021 [employer].

## 2021-02-05 ENCOUNTER — Other Ambulatory Visit: Payer: Self-pay

## 2021-02-06 ENCOUNTER — Encounter: Payer: Self-pay | Admitting: Internal Medicine

## 2021-02-06 ENCOUNTER — Other Ambulatory Visit: Payer: Self-pay | Admitting: Internal Medicine

## 2021-02-06 DIAGNOSIS — Z8639 Personal history of other endocrine, nutritional and metabolic disease: Secondary | ICD-10-CM

## 2021-02-06 DIAGNOSIS — E538 Deficiency of other specified B group vitamins: Secondary | ICD-10-CM | POA: Insufficient documentation

## 2021-02-07 ENCOUNTER — Encounter: Payer: Self-pay | Admitting: Internal Medicine

## 2021-02-07 DIAGNOSIS — I700 Atherosclerosis of aorta: Secondary | ICD-10-CM | POA: Insufficient documentation

## 2021-02-07 DIAGNOSIS — R419 Unspecified symptoms and signs involving cognitive functions and awareness: Secondary | ICD-10-CM | POA: Insufficient documentation

## 2021-02-07 DIAGNOSIS — J479 Bronchiectasis, uncomplicated: Secondary | ICD-10-CM | POA: Insufficient documentation

## 2021-02-07 NOTE — Assessment & Plan Note (Signed)
Her  random glucose is not  elevated but her A1c suggests  she is at continued  risk for developing diabetes.  I recommend she follow a low glycemic index diet and particpate regularly in an aerobic  exercise activity.  We should check an A1c in 6 months.    Lab Results  Component Value Date   HGBA1C 6.1 02/04/2021

## 2021-02-07 NOTE — Assessment & Plan Note (Addendum)
Reviewed findings of prior CT scan today..  Patient is tolerating statin therapy and LFTS are normal  Lab Results  Component Value Date   CHOL 207 (H) 07/03/2020   HDL 66.90 07/03/2020   LDLCALC 113 (H) 07/03/2020   LDLDIRECT 94.3 03/26/2014   TRIG 132.0 07/03/2020   CHOLHDL 3 07/03/2020   Lab Results  Component Value Date   ALT 16 02/04/2021   AST 22 02/04/2021   ALKPHOS 63 02/04/2021   BILITOT 0.6 02/04/2021

## 2021-02-07 NOTE — Assessment & Plan Note (Signed)
The screening blood tests  And MMSE were done and noted a low B12 level.    Lab Results  Component Value Date   TSH 3.05 02/04/2021   Lab Results  Component Value Date   VITAMINB12 164 (L) 02/04/2021

## 2021-02-07 NOTE — Assessment & Plan Note (Signed)
Noted on Chest CT . Referral to PUlmonology in progress

## 2021-02-07 NOTE — Assessment & Plan Note (Signed)
Her B12 is low . She has been advised to return  for b12 injections weely for 3 weeks,  As untreated B12 deficiency can lead to irreversible dementia and neuropathy .  RC folate and IF ab ordered

## 2021-02-09 ENCOUNTER — Ambulatory Visit (INDEPENDENT_AMBULATORY_CARE_PROVIDER_SITE_OTHER): Payer: PPO

## 2021-02-09 ENCOUNTER — Other Ambulatory Visit: Payer: Self-pay

## 2021-02-09 DIAGNOSIS — E538 Deficiency of other specified B group vitamins: Secondary | ICD-10-CM | POA: Diagnosis not present

## 2021-02-09 MED ORDER — CYANOCOBALAMIN 1000 MCG/ML IJ SOLN
1000.0000 ug | Freq: Once | INTRAMUSCULAR | Status: AC
  Administered 2021-02-09: 1000 ug via INTRAMUSCULAR

## 2021-02-09 NOTE — Progress Notes (Signed)
Patient presented for B 12 injection to left deltoid, patient voiced no concerns nor showed any signs of distress during injection. 

## 2021-02-10 ENCOUNTER — Telehealth: Payer: Self-pay | Admitting: Cardiovascular Disease

## 2021-02-10 ENCOUNTER — Other Ambulatory Visit: Payer: Self-pay | Admitting: Internal Medicine

## 2021-02-10 DIAGNOSIS — E7800 Pure hypercholesterolemia, unspecified: Secondary | ICD-10-CM

## 2021-02-10 NOTE — Telephone Encounter (Signed)
Attempted to call pt back, LDM on VM (DPR approved) based on record pt has not been seen since March 2021, it is time for a yearly f/u visit. Also, pt had called in based on a CT scan from December from outside provider and wanted to change medications based on the results. Left message explaining pt needs to have an appt schedule for her yearly f/u and to discuss CT results and possible medications changes. Secure message sent to scheduling as well so they may try and reach pt for scheduling an upcoming appt.

## 2021-02-10 NOTE — Telephone Encounter (Signed)
Patient calling to discuss December ct results and possible need to increase statin based on results.  Please call.

## 2021-02-17 ENCOUNTER — Ambulatory Visit (INDEPENDENT_AMBULATORY_CARE_PROVIDER_SITE_OTHER): Payer: PPO | Admitting: *Deleted

## 2021-02-17 ENCOUNTER — Ambulatory Visit: Payer: PPO

## 2021-02-17 ENCOUNTER — Other Ambulatory Visit: Payer: Self-pay

## 2021-02-17 DIAGNOSIS — E7800 Pure hypercholesterolemia, unspecified: Secondary | ICD-10-CM | POA: Diagnosis not present

## 2021-02-17 DIAGNOSIS — E538 Deficiency of other specified B group vitamins: Secondary | ICD-10-CM | POA: Diagnosis not present

## 2021-02-17 LAB — LIPID PANEL
Cholesterol: 190 mg/dL (ref 0–200)
HDL: 68.1 mg/dL (ref >39.00)
LDL Cholesterol: 105 mg/dL — ABNORMAL HIGH (ref 0–99)
NonHDL: 121.83
Total CHOL/HDL Ratio: 3
Triglycerides: 85 mg/dL (ref 0.0–149.0)
VLDL: 17 mg/dL (ref 0.0–40.0)

## 2021-02-17 MED ORDER — CYANOCOBALAMIN 1000 MCG/ML IJ SOLN
1000.0000 ug | Freq: Once | INTRAMUSCULAR | Status: AC
  Administered 2021-02-17: 1000 ug via INTRAMUSCULAR

## 2021-02-17 NOTE — Progress Notes (Signed)
Patient presented for B 12 injection to right deltoid, patient voiced no concerns nor showed any signs of distress during injection. 

## 2021-02-19 ENCOUNTER — Telehealth: Payer: Self-pay

## 2021-02-19 ENCOUNTER — Other Ambulatory Visit: Payer: Self-pay | Admitting: Internal Medicine

## 2021-02-19 DIAGNOSIS — E7800 Pure hypercholesterolemia, unspecified: Secondary | ICD-10-CM

## 2021-02-19 LAB — INTRINSIC FACTOR ANTIBODIES: Intrinsic Factor: POSITIVE — AB

## 2021-02-19 LAB — FOLATE RBC: RBC Folate: 616 ng/mL RBC (ref >280)

## 2021-02-19 MED ORDER — ATORVASTATIN CALCIUM 20 MG PO TABS: 20.0000 mg | ORAL_TABLET | Freq: Every day | ORAL | 3 refills | Status: DC

## 2021-02-19 NOTE — Telephone Encounter (Signed)
LMTCB. Need to schedule pt a non fasting lab appt in 6 weeks.

## 2021-02-20 NOTE — Telephone Encounter (Signed)
Pt called back to get results wanted to talk with you before scheduling labs

## 2021-02-23 ENCOUNTER — Other Ambulatory Visit: Payer: Self-pay | Admitting: Internal Medicine

## 2021-02-23 NOTE — Telephone Encounter (Signed)
Patient returned office phone call for results 

## 2021-02-23 NOTE — Telephone Encounter (Signed)
LMTCB

## 2021-02-25 ENCOUNTER — Ambulatory Visit: Payer: PPO

## 2021-02-25 NOTE — Telephone Encounter (Signed)
See result note message 

## 2021-03-03 ENCOUNTER — Ambulatory Visit (INDEPENDENT_AMBULATORY_CARE_PROVIDER_SITE_OTHER): Payer: PPO

## 2021-03-03 ENCOUNTER — Other Ambulatory Visit: Payer: Self-pay

## 2021-03-03 DIAGNOSIS — E538 Deficiency of other specified B group vitamins: Secondary | ICD-10-CM | POA: Diagnosis not present

## 2021-03-03 MED ORDER — CYANOCOBALAMIN 1000 MCG/ML IJ SOLN
1000.0000 ug | Freq: Once | INTRAMUSCULAR | Status: AC
  Administered 2021-03-03: 1000 ug via INTRAMUSCULAR

## 2021-03-03 NOTE — Progress Notes (Signed)
Patient presented for B 12 injection to left deltoid, patient voiced no concerns nor showed any signs of distress during injection. 

## 2021-03-12 ENCOUNTER — Other Ambulatory Visit: Payer: Self-pay | Admitting: Internal Medicine

## 2021-03-17 ENCOUNTER — Other Ambulatory Visit: Payer: Self-pay

## 2021-03-17 ENCOUNTER — Ambulatory Visit: Payer: PPO | Admitting: Pulmonary Disease

## 2021-03-17 ENCOUNTER — Encounter: Payer: Self-pay | Admitting: Pulmonary Disease

## 2021-03-17 VITALS — BP 114/58 | HR 67 | Temp 97.7°F | Ht 62.5 in | Wt 133.8 lb

## 2021-03-17 DIAGNOSIS — J479 Bronchiectasis, uncomplicated: Secondary | ICD-10-CM

## 2021-03-17 DIAGNOSIS — R918 Other nonspecific abnormal finding of lung field: Secondary | ICD-10-CM

## 2021-03-17 NOTE — Progress Notes (Signed)
Cardiology Office Note  Date:  03/18/2021   ID:  Alexis James, DOB July 10, 1951, MRN [id number]  PCP:  Alexis Mc, MD   Chief Complaint  Patient presents with  . Other    12 month follow up. Patient stated that her PCP that her CT showed plaque build up. Meds reviewed verbally with patient.     HPI:  [name] is a 70 year old woman with past medical history of Prediabetes Trivial aortic atherosclerosis presents for f/u for palpitations, irregular rhythm, bradycardia, vasovagal syncope, aortic atherosclerosis  Last seen in clinic by myself March 2021  She is concerned about recent findings detailed on her CT scan chest CT scan December 2021, report reviewed Aortic atherosclerosis Images pulled up and reviewed with her in detail today Minimal aortic atherosclerosis noted in the arch No significant plaque noted in the ascending or descending aorta, no significant coronary calcification  Active baseline, denies angina, no significant shortness of breath on exertion  Lab work reviewed from March 2022 total cholesterol 190, LDL 105, on lovastatin 40 A1c 6.1  Reports that lovastatin recently changed to Lipitor 20, has not started yet Denies any side effects on the lovastatin  EKG personally reviewed by myself on todays visit Shows normal sinus rhythm rate 64 bpm, no ST or T wave changes  Other past medical history reviewed [facility] admission 06/2019 for evaluation of syncope with noted suspicion for vasovagal syncope exacerbated by dehydration and UTI as underlying cause.  Low risk stress test: 07/04/2019  There is no significant coronary artery calcification.  Echo 06/2019 reviewed 1. The left ventricle has normal systolic function with an ejection  fraction of 60-65%. The cavity size was normal. There is mildly increased  left ventricular wall thickness. Left ventricular diastolic Doppler  parameters are consistent with impaired  relaxation.  2. The right ventricle has  normal systolic function.  Tricuspid valve regurgitation  is mild-moderate.   Event monitor Normal sinus rhythm Patient triggered events were not associated with significant arrhythmia.   Avg HR of 77 bpm. 1 run of Ventricular Tachycardia occurred lasting 4 beats with a max rate of 144 bpm (avg 142 bpm).  5 Supraventricular Tachycardia/atrial tachycardia runs occurred, the run with the fastest interval lasting 6 beats with a max rate of 179 bpm,  the longest lasting 7 beats with an avg rate of 154 bpm.  Isolated SVEs were rare (<1.0%), SVE Couplets were rare (<1.0%), and SVE Triplets were rare (<1.0%). Isolated VEs were rare (<1.0%), VE Couplets were rare (<1.0%), and no VE Triplets were present.  Lab work reviewed in detail Hemoglobin A1c 5.5 TSH 2.9   PMH:   has a past medical history of Anxiety, Cancer ([place]), Diabetes mellitus without complication ([place]), Hyperlipidemia, and Syncope and collapse (05/02/2019).  PSH:    Past Surgical History:  Procedure Laterality Date  . BREAST SURGERY     breast reduction  . CATARACT EXTRACTION W/PHACO Right 10/18/2017   Procedure: CATARACT EXTRACTION PHACO AND INTRAOCULAR LENS PLACEMENT (IOC);  Surgeon: Birder Robson, MD;  Location: [facility] ORS;  Service: Ophthalmology;  Laterality: Right;  [place] 00:27 AP% 19.7 CDE5.32 fluid pack lot # [id number]  . CATARACT EXTRACTION W/PHACO Left 11/21/2018   Procedure: CATARACT EXTRACTION PHACO AND INTRAOCULAR LENS PLACEMENT (IOC);  Surgeon: Birder Robson, MD;  Location: [facility] ORS;  Service: Ophthalmology;  Laterality: Left;  [place] 00:31.3 CDE 5.38 Fluid Pack Lot # [id number]  . CESAREAN SECTION    . REDUCTION MAMMAPLASTY    . smr  Current Outpatient Medications  Medication Sig Dispense Refill  . acetaminophen (TYLENOL) 500 MG tablet Take 1,000 mg by mouth every 6 (six) hours as needed for moderate pain.    . Calcium Carbonate-Vitamin D3 600-400 MG-UNIT TABS Take 1 tablet by mouth daily.    .  cholecalciferol (VITAMIN D) 25 MCG (1000 UNIT) tablet Take 1,000 Units by mouth daily.    . Coenzyme Q10 (COQ-10) 100 MG CAPS Take 100 mg by mouth daily.    Marland Kitchen docusate sodium (COLACE) 100 MG capsule Take 100 mg by mouth daily as needed for mild constipation.    Marland Kitchen doxylamine, Sleep, (UNISOM) 25 MG tablet Take 25 mg by mouth at bedtime as needed for sleep.    Marland Kitchen escitalopram (LEXAPRO) 10 MG tablet Take 1 tablet by mouth once daily 90 tablet 2  . lovastatin (MEVACOR) 40 MG tablet Take 1 tablet (40 mg total) by mouth at bedtime. 90 tablet 3   No current facility-administered medications for this visit.    Allergies:   Patient has no known allergies.   Social History:  The patient  reports that she is a non-smoker but has been exposed to tobacco smoke. She has never used smokeless tobacco. She reports current alcohol use. She reports that she does not use drugs.   Family History:   family history includes Alzheimer's disease in her father; Colitis (age of onset: 59) in her mother; Heart Problems in her brother; Mental retardation (age of onset: 45) in her father; Transient ischemic attack in her sister.   Review of Systems: Review of Systems  Constitutional: Negative.   Respiratory: Negative.   Cardiovascular: Negative.   Gastrointestinal: Negative.   Musculoskeletal: Negative.   Neurological: Negative.   Psychiatric/Behavioral: Negative.   All other systems reviewed and are negative.   PHYSICAL EXAM: VS:  BP 120/62 (BP Location: Left Arm, Patient Position: Sitting, Cuff Size: Normal)   Pulse 64   Ht 5\' 2"  (1.575 m)   Wt 132 lb 8 oz (60.1 kg)   SpO2 97%   BMI 24.23 kg/m  , BMI Body mass index is 24.23 kg/m. Constitutional:  oriented to person, place, and time. No distress.  HENT:  Head: Grossly normal Eyes:  no discharge. No scleral icterus.  Neck: No JVD, no carotid bruits  Cardiovascular: Regular rate and rhythm, no murmurs appreciated Pulmonary/Chest: Clear to auscultation  bilaterally, no wheezes or rails Abdominal: Soft.  no distension.  no tenderness.  Musculoskeletal: Normal range of motion Neurological:  normal muscle tone. Coordination normal. No atrophy Skin: Skin warm and dry Psychiatric: normal affect, pleasant  Recent Labs: 02/04/2021: ALT 16; BUN 20; Creatinine, Ser 0.81; Potassium 4.4; Sodium 139; TSH 3.05    Lipid Panel Lab Results  Component Value Date   CHOL 190 02/17/2021   HDL 68.10 02/17/2021   LDLCALC 105 (H) 02/17/2021   TRIG 85.0 02/17/2021      Wt Readings from Last 3 Encounters:  03/18/21 132 lb 8 oz (60.1 kg)  03/17/21 133 lb 12.8 oz (60.7 kg)  02/04/21 130 lb 6.4 oz (59.1 kg)      ASSESSMENT AND PLAN:  Aortic atherosclerosis Noted on CT scan images pulled up with her today, minimal any aortic arch She feels comfortable continuing the lovastatin 40, does not want to add Zetia or change to Lipitor at this time Reassurance provided given minimal amount of plaque on imaging  PVC's (premature ventricular contractions) - No significant sx Rare on event monitor  Bradycardia  Maintaining adequate  rate on today's visit  Palpitations Rare PVCs, minimally symptomatic  Anxiety Stable, reassurance provided, she is doing well  Syncope Vagovagal, No recent episodes, recommend she stay hydrated in the summertime  Hyperlipidemia At her request she has been placed back on lovastatin, she did not even try the Lipitor   Total encounter time more than 25 minutes  Greater than 50% was spent in counseling and coordination of care with the patient   Orders Placed This Encounter  Procedures  . EKG 12-Lead     Signed, Esmond Plants, M.D., Ph.D. 03/18/2021  [place], [place]

## 2021-03-17 NOTE — Progress Notes (Signed)
Subjective:    Patient ID: Alexis James, female    DOB: 1951/08/13, 70 y.o.   MRN: [id number] Chief Complaint  Patient presents with   Consult    Bronchiectasis - no issues   HPI The patient is a 70 year old lifelong never smoker though with significant secondhand smoke exposure who presents for evaluation of a cough of 3 months duration.  She is kindly referred by Dr. Deborra Medina.  The patient states that twice a year she gets episodes were she has difficulties with cough most recently she had an episode that lasted for 3 months.  She states that usually this happens when she moves about but resolves when she lays down.  Previously she had sinus surgery and this resolved the issue.  She has not had any fevers, chills or sweats.  No hemoptysis and no sputum production.  She notes that her most recent episode has now totally resolved.  Today she is fully asymptomatic.  A CT scan on November 18, 2020 that showed stable multiple lung nodules 3 mm or less mild bandlike scaring at the bases with some tubular bronchiectasis.  This appears to be sequela from prior infection/inflammation.  All of these findings are stable and no further CT follow-up was recommended.  She does have issues with significant gastroesophageal reflux symptoms.  She is referred mostly for the bronchiectatic changes on the CT which are very mild.  Not had PFTs recently cannot recall the last time she had PFTs.  Not interested in pursuing these.  She has not had occupational exposure, no military history.  Has always resided in [place].  Is not on ACE inhibitor's.   Review of Systems  A 10 point review of systems was performed and it is as noted above otherwise negative.  Past Medical History:  Diagnosis Date   Anxiety    Cancer ([place])    skin   Diabetes mellitus without complication ([place])    Hyperlipidemia    Syncope and collapse 05/02/2019   Past Surgical History:  Procedure Laterality Date   BREAST SURGERY      breast reduction   CATARACT EXTRACTION W/PHACO Right 10/18/2017   Procedure: CATARACT EXTRACTION PHACO AND INTRAOCULAR LENS PLACEMENT ([place]);  Surgeon: Birder Robson, MD;  Location: [facility] ORS;  Service: Ophthalmology;  Laterality: Right;  [place] 00:27 AP% 19.7 CDE5.32 fluid pack lot # [id number]   CATARACT EXTRACTION W/PHACO Left 11/21/2018   Procedure: CATARACT EXTRACTION PHACO AND INTRAOCULAR LENS PLACEMENT (IOC);  Surgeon: Birder Robson, MD;  Location: [facility] ORS;  Service: Ophthalmology;  Laterality: Left;  [place] 00:31.3 CDE 5.38 Fluid Pack Lot # [id number]   CESAREAN SECTION     REDUCTION MAMMAPLASTY     smr     Family History  Problem Relation Age of Onset   Colitis Mother 22   Mental retardation Father 73   Alzheimer's disease Father    Heart Problems Brother    Transient ischemic attack Sister    Cancer Neg Hx    Breast cancer Neg Hx    Social History   Tobacco Use   Smoking status: Passive Smoke Exposure - Never Smoker   Smokeless tobacco: Never Used   Tobacco comment: Husband smoked x 38 years (quit 4 years ago)  Substance Use Topics   Alcohol use: Yes    Comment: Occasional   No Known Allergies Current Meds  Medication Sig   acetaminophen (TYLENOL) 500 MG tablet Take 1,000 mg by mouth every 6 (six) hours as needed  for moderate pain.   atorvastatin (LIPITOR) 20 MG tablet Take 1 tablet (20 mg total) by mouth daily.   Calcium Carb-Cholecalciferol 600-800 MG-UNIT TABS Take 1 tablet by mouth 2 (two) times daily.   calcium carbonate (TUMS - DOSED IN MG ELEMENTAL CALCIUM) 500 MG chewable tablet Chew 2 tablets by mouth daily as needed for indigestion or heartburn.   Calcium Carbonate-Vitamin D3 600-400 MG-UNIT TABS Take 1 tablet by mouth daily.   cholecalciferol (VITAMIN D) 25 MCG (1000 UNIT) tablet Take 1,000 Units by mouth daily.   Coenzyme Q10 (COQ-10) 100 MG CAPS Take 100 mg by mouth daily.   docusate sodium (COLACE) 100 MG capsule Take 100 mg by mouth daily as needed for  mild constipation.   doxylamine, Sleep, (UNISOM) 25 MG tablet Take 25 mg by mouth at bedtime as needed for sleep.   escitalopram (LEXAPRO) 10 MG tablet Take 1 tablet by mouth once daily   Immunization History  Administered Date(s) Administered   Influenza Split 10/15/2011, 10/30/2014, 11/03/2015   Influenza, High Dose Seasonal PF 09/28/2017, 09/01/2019   Influenza-Unspecified 08/13/2012, 09/05/2018, 09/10/2020   PFIZER(Purple Top)SARS-COV-2 Vaccination 01/24/2020, 02/18/2020, 10/10/2020   Pneumococcal Conjugate-13 04/05/2018   Pneumococcal Polysaccharide-23 04/02/2016, 09/01/2019   Tdap 04/17/2015   Zoster 03/26/2010   Zoster Recombinat (Shingrix) 04/05/2017, 06/28/2017      Objective:   Physical Exam Blood pressure (!) 114/58, pulse 67, temperature 97.7 F (36.5 C), temperature source Temporal, height 5' 2.5" (1.588 m), weight 133 lb 12.8 oz (60.7 kg), SpO2 96 %. GENERAL: Well-developed well-nourished woman, no acute distress.  Fully ambulatory, no conversational dyspnea. HEAD: Normocephalic, atraumatic.  EYES: Pupils equal, round, reactive to light.  No scleral icterus.  MOUTH: Nose/mouth/throat not examined due to masking requirements for COVID 19. NECK: Supple. No thyromegaly. Trachea midline. No JVD.  No adenopathy. PULMONARY: Good air entry bilaterally.  No adventitious sounds. CARDIOVASCULAR: S1 and S2. Regular rate and rhythm.  No rubs, murmurs or gallops heard. ABDOMEN: Benign MUSCULOSKELETAL: No joint deformity, no clubbing, no edema.  NEUROLOGIC: No focal deficit, no gait disturbance, speech is fluent. SKIN: Intact,warm,dry. PSYCH: Mood and behavior normal.  Representative slices of CT performed 18 November 2020 study performed for follow-up on lung nodules:        Assessment & Plan:     ICD-10-CM   1. Bronchiectasis without complication (HCC) - mild  J47.9    Currently asymptomatic Patient does not wish further work-up at present Has been advised to call if  problems    2. Multiple lung nodules on CT  R91.8    Stable appear inflammatory/infectious Multiple, less than 3 mm Benign      Patient had issues with cough that lasted for 3 months this spontaneously resolved.  Currently does not voice any complaints.  She does have mild tubular bronchiectasis on CT likely sequela from prior infection/inflammation.  Currently she feels she has no need for further testing.  She has been encouraged to return if her symptoms recur.  Renold Don, MD Advanced Bronchoscopy PCCM [facility]    *This note was dictated using voice recognition software/Dragon.  Despite best efforts to proofread, errors can occur which can change the meaning.  Any change was purely unintentional.

## 2021-03-17 NOTE — Patient Instructions (Signed)
It was a pleasure meeting you today.  Monitor your symptoms.  If you start having cough again let us know so that you can be seen again.  Your CT findings are very common I would not worry about these at present.  Should you notice any change in cough or if you start producing a lot of mucus when you cough let us know.  Otherwise we will see you here as needed.

## 2021-03-18 ENCOUNTER — Ambulatory Visit (INDEPENDENT_AMBULATORY_CARE_PROVIDER_SITE_OTHER): Payer: PPO | Admitting: Cardiovascular Disease

## 2021-03-18 ENCOUNTER — Encounter: Payer: Self-pay | Admitting: Cardiovascular Disease

## 2021-03-18 VITALS — BP 120/62 | HR 64 | Ht 62.0 in | Wt 132.5 lb

## 2021-03-18 DIAGNOSIS — I493 Ventricular premature depolarization: Secondary | ICD-10-CM

## 2021-03-18 DIAGNOSIS — R001 Bradycardia, unspecified: Secondary | ICD-10-CM | POA: Diagnosis not present

## 2021-03-18 DIAGNOSIS — R002 Palpitations: Secondary | ICD-10-CM | POA: Diagnosis not present

## 2021-03-18 DIAGNOSIS — I951 Orthostatic hypotension: Secondary | ICD-10-CM

## 2021-03-18 DIAGNOSIS — E785 Hyperlipidemia, unspecified: Secondary | ICD-10-CM

## 2021-03-18 MED ORDER — LOVASTATIN 40 MG PO TABS
40.0000 mg | ORAL_TABLET | Freq: Every day | ORAL | 3 refills | Status: DC
Start: 2021-03-18 — End: 2021-12-03

## 2021-03-18 NOTE — Patient Instructions (Signed)
Medication Instructions:  No changes  If you need a refill on your cardiac medications before your next appointment, please call your pharmacy.    Lab work: No new labs needed   If you have labs (blood work) drawn today and your tests are completely normal, you will receive your results only by: . MyChart Message (if you have MyChart) OR . A paper copy in the mail If you have any lab test that is abnormal or we need to change your treatment, we will call you to review the results.   Testing/Procedures: No new testing needed   Follow-Up: At [facility], you and your health needs are our priority.  As part of our continuing mission to provide you with exceptional heart care, we have created designated Provider Care Teams.  These Care Teams include your primary Cardiologist (physician) and Advanced Practice Providers (APPs -  Physician Assistants and Nurse Practitioners) who all work together to provide you with the care you need, when you need it.  . You will need a follow up appointment in 12 months  . Providers on your designated Care Team:   . Christopher Berge, NP . Ryan Dunn, PA-C . Jacquelyn Visser, PA-C  Any Other Special Instructions Will Be Listed Below (If Applicable).  COVID-19 Vaccine Information can be found at: [email or web address] For questions related to vaccine distribution or appointments, please email [email or web address] or call [phone]     

## 2021-04-01 ENCOUNTER — Ambulatory Visit: Payer: PPO

## 2021-04-06 ENCOUNTER — Ambulatory Visit (INDEPENDENT_AMBULATORY_CARE_PROVIDER_SITE_OTHER): Payer: PPO

## 2021-04-06 ENCOUNTER — Other Ambulatory Visit: Payer: Self-pay

## 2021-04-06 DIAGNOSIS — E538 Deficiency of other specified B group vitamins: Secondary | ICD-10-CM | POA: Diagnosis not present

## 2021-04-06 MED ORDER — CYANOCOBALAMIN 1000 MCG/ML IJ SOLN
1000.0000 ug | Freq: Once | INTRAMUSCULAR | Status: AC
  Administered 2021-04-06: 1000 ug via INTRAMUSCULAR

## 2021-04-06 NOTE — Progress Notes (Signed)
Patient presented for B 12 injection to left deltoid, patient voiced no concerns nor showed any signs of distress during injection. 

## 2021-05-07 ENCOUNTER — Ambulatory Visit: Payer: PPO

## 2021-05-14 ENCOUNTER — Ambulatory Visit: Payer: PPO

## 2021-05-18 ENCOUNTER — Ambulatory Visit (INDEPENDENT_AMBULATORY_CARE_PROVIDER_SITE_OTHER): Payer: PPO

## 2021-05-18 ENCOUNTER — Other Ambulatory Visit: Payer: Self-pay

## 2021-05-18 VITALS — BP 106/63 | HR 66 | Temp 98.0°F | Resp 14 | Ht 62.0 in | Wt 133.0 lb

## 2021-05-18 DIAGNOSIS — Z1211 Encounter for screening for malignant neoplasm of colon: Secondary | ICD-10-CM | POA: Diagnosis not present

## 2021-05-18 DIAGNOSIS — Z0000 Encounter for general adult medical examination without abnormal findings: Secondary | ICD-10-CM | POA: Diagnosis not present

## 2021-05-18 DIAGNOSIS — E538 Deficiency of other specified B group vitamins: Secondary | ICD-10-CM | POA: Diagnosis not present

## 2021-05-18 MED ORDER — CYANOCOBALAMIN 1000 MCG/ML IJ SOLN
1000.0000 ug | Freq: Once | INTRAMUSCULAR | Status: AC
  Administered 2021-05-18: 1000 ug via INTRAMUSCULAR

## 2021-05-18 NOTE — Progress Notes (Signed)
Patient presented for B 12 injection to right deltoid, patient voiced no concerns nor showed any signs of distress during injection. 

## 2021-05-18 NOTE — Patient Instructions (Addendum)
Alexis James , Thank you for taking time to come for your Medicare Wellness Visit. I appreciate your ongoing commitment to your health goals. Please review the following plan we discussed and let me know if I can assist you in the future.   These are the goals we discussed: Goals    . DIET - INCREASE LEAN PROTEINS     Healthy diet Low carb foods       This is a list of the screening recommended for you and due dates:  Health Maintenance  Topic Date Due  . Cologuard (Stool DNA test)  Never done  . Flu Shot  07/13/2021  . Mammogram  08/25/2022  . Tetanus Vaccine  04/16/2025  . DEXA scan (bone density measurement)  Completed  . COVID-19 Vaccine  Completed  . Hepatitis C Screening: USPSTF Recommendation to screen - Ages 85-79 yo.  Completed  . Pneumonia vaccines  Completed  . Zoster (Shingles) Vaccine  Completed  . Pneumococcal Vaccination  Aged Out  . HPV Vaccine  Aged Out    Immunizations Immunization History  Administered Date(s) Administered  . Influenza Split 10/15/2011, 10/30/2014, 11/03/2015  . Influenza, High Dose Seasonal PF 09/28/2017, 09/01/2019  . Influenza-Unspecified 08/13/2012, 09/05/2018, 09/10/2020  . PFIZER(Purple Top)SARS-COV-2 Vaccination 01/24/2020, 02/18/2020, 10/10/2020  . Pneumococcal Conjugate-13 04/05/2018  . Pneumococcal Polysaccharide-23 04/02/2016, 09/01/2019  . Tdap 04/17/2015  . Zoster Recombinat (Shingrix) 04/05/2017, 06/28/2017  . Zoster, Live 03/26/2010   Advanced directives: not yet completed  Conditions/risks identified: none new  Follow up in one year for your annual wellness visit   Preventive Care 65 Years and Older, Female Preventive care refers to lifestyle choices and visits with your health care provider that can promote health and wellness. What does preventive care include?  A yearly physical exam. This is also called an annual well check.  Dental exams once or twice a year.  Routine eye exams. Ask your health care  provider how often you should have your eyes checked.  Personal lifestyle choices, including:  Daily care of your teeth and gums.  Regular physical activity.  Eating a healthy diet.  Avoiding tobacco and drug use.  Limiting alcohol use.  Practicing safe sex.  Taking low-dose aspirin every day.  Taking vitamin and mineral supplements as recommended by your health care provider. What happens during an annual well check? The services and screenings done by your health care provider during your annual well check will depend on your age, overall health, lifestyle risk factors, and family history of disease. Counseling  Your health care provider may ask you questions about your:  Alcohol use.  Tobacco use.  Drug use.  Emotional well-being.  Home and relationship well-being.  Sexual activity.  Eating habits.  History of falls.  Memory and ability to understand (cognition).  Work and work Statistician.  Reproductive health. Screening  You may have the following tests or measurements:  Height, weight, and BMI.  Blood pressure.  Lipid and cholesterol levels. These may be checked every 5 years, or more frequently if you are over 24 years old.  Skin check.  Lung cancer screening. You may have this screening every year starting at age 88 if you have a 30-pack-year history of smoking and currently smoke or have quit within the past 15 years.  Fecal occult blood test (FOBT) of the stool. You may have this test every year starting at age 50.  Flexible sigmoidoscopy or colonoscopy. You may have a sigmoidoscopy every 5 years or a colonoscopy  every 10 years starting at age 62.  Hepatitis C blood test.  Hepatitis B blood test.  Sexually transmitted disease (STD) testing.  Diabetes screening. This is done by checking your blood sugar (glucose) after you have not eaten for a while (fasting). You may have this done every 1-3 years.  Bone density scan. This is done to  screen for osteoporosis. You may have this done starting at age 49.  Mammogram. This may be done every 1-2 years. Talk to your health care provider about how often you should have regular mammograms. Talk with your health care provider about your test results, treatment options, and if necessary, the need for more tests. Vaccines  Your health care provider may recommend certain vaccines, such as:  Influenza vaccine. This is recommended every year.  Tetanus, diphtheria, and acellular pertussis (Tdap, Td) vaccine. You may need a Td booster every 10 years.  Zoster vaccine. You may need this after age 56.  Pneumococcal 13-valent conjugate (PCV13) vaccine. One dose is recommended after age 60.  Pneumococcal polysaccharide (PPSV23) vaccine. One dose is recommended after age 32. Talk to your health care provider about which screenings and vaccines you need and how often you need them. This information is not intended to replace advice given to you by your health care provider. Make sure you discuss any questions you have with your health care provider. Document Released: 12/26/2015 Document Revised: 08/18/2016 Document Reviewed: 09/30/2015 Elsevier Interactive Patient Education  2017 [place] Prevention in the Home Falls can cause injuries. They can happen to people of all ages. There are many things you can do to make your home safe and to help prevent falls. What can I do on the outside of my home?  Regularly fix the edges of walkways and driveways and fix any cracks.  Remove anything that might make you trip as you walk through a door, such as a raised step or threshold.  Trim any bushes or trees on the path to your home.  Use bright outdoor lighting.  Clear any walking paths of anything that might make someone trip, such as rocks or tools.  Regularly check to see if handrails are loose or broken. Make sure that both sides of any steps have handrails.  Any raised decks  and porches should have guardrails on the edges.  Have any leaves, snow, or ice cleared regularly.  Use sand or salt on walking paths during winter.  Clean up any spills in your garage right away. This includes oil or grease spills. What can I do in the bathroom?  Use night lights.  Install grab bars by the toilet and in the tub and shower. Do not use towel bars as grab bars.  Use non-skid mats or decals in the tub or shower.  If you need to sit down in the shower, use a plastic, non-slip stool.  Keep the floor dry. Clean up any water that spills on the floor as soon as it happens.  Remove soap buildup in the tub or shower regularly.  Attach bath mats securely with double-sided non-slip rug tape.  Do not have throw rugs and other things on the floor that can make you trip. What can I do in the bedroom?  Use night lights.  Make sure that you have a light by your bed that is easy to reach.  Do not use any sheets or blankets that are too big for your bed. They should not hang down onto the floor.  Have a firm chair that has side arms. You can use this for support while you get dressed.  Do not have throw rugs and other things on the floor that can make you trip. What can I do in the kitchen?  Clean up any spills right away.  Avoid walking on wet floors.  Keep items that you use a lot in easy-to-reach places.  If you need to reach something above you, use a strong step stool that has a grab bar.  Keep electrical cords out of the way.  Do not use floor polish or wax that makes floors slippery. If you must use wax, use non-skid floor wax.  Do not have throw rugs and other things on the floor that can make you trip. What can I do with my stairs?  Do not leave any items on the stairs.  Make sure that there are handrails on both sides of the stairs and use them. Fix handrails that are broken or loose. Make sure that handrails are as long as the stairways.  Check any  carpeting to make sure that it is firmly attached to the stairs. Fix any carpet that is loose or worn.  Avoid having throw rugs at the top or bottom of the stairs. If you do have throw rugs, attach them to the floor with carpet tape.  Make sure that you have a light switch at the top of the stairs and the bottom of the stairs. If you do not have them, ask someone to add them for you. What else can I do to help prevent falls?  Wear shoes that:  Do not have high heels.  Have rubber bottoms.  Are comfortable and fit you well.  Are closed at the toe. Do not wear sandals.  If you use a stepladder:  Make sure that it is fully opened. Do not climb a closed stepladder.  Make sure that both sides of the stepladder are locked into place.  Ask someone to hold it for you, if possible.  Clearly mark and make sure that you can see:  Any grab bars or handrails.  First and last steps.  Where the edge of each step is.  Use tools that help you move around (mobility aids) if they are needed. These include:  Canes.  Walkers.  Scooters.  Crutches.  Turn on the lights when you go into a dark area. Replace any light bulbs as soon as they burn out.  Set up your furniture so you have a clear path. Avoid moving your furniture around.  If any of your floors are uneven, fix them.  If there are any pets around you, be aware of where they are.  Review your medicines with your doctor. Some medicines can make you feel dizzy. This can increase your chance of falling. Ask your doctor what other things that you can do to help prevent falls. This information is not intended to replace advice given to you by your health care provider. Make sure you discuss any questions you have with your health care provider. Document Released: 09/25/2009 Document Revised: 05/06/2016 Document Reviewed: 01/03/2015 Elsevier Interactive Patient Education  2017 [employer].

## 2021-05-18 NOTE — Progress Notes (Addendum)
Subjective:   Alexis James is a 70 y.o. female who presents for Medicare Annual (Subsequent) preventive examination.  Review of Systems    No ROS.  Medicare Wellness   Cardiac Risk Factors include: advanced age (>68men, >68 women);diabetes mellitus     Objective:    Today's Vitals   05/18/21 1410  BP: 106/63  Pulse: 66  Resp: 14  Temp: 98 F (36.7 C)  SpO2: 94%  Weight: 133 lb (60.3 kg)  Height: 5\' 2"  (1.575 m)   Body mass index is 24.33 kg/m.  Advanced Directives 05/18/2021 11/19/2020 03/28/2020 08/03/2019 07/03/2019 07/03/2019  Does Patient Have a Medical Advance Directive? No No No No No No  Would patient like information on creating a medical advance directive? No - Patient declined No - Patient declined Yes (MAU/Ambulatory/Procedural Areas - Information given) No - Patient declined No - Patient declined No - Patient declined    Current Medications (verified) Outpatient Encounter Medications as of 05/18/2021  Medication Sig   acetaminophen (TYLENOL) 500 MG tablet Take 1,000 mg by mouth every 6 (six) hours as needed for moderate pain.   Calcium Carbonate-Vitamin D3 600-400 MG-UNIT TABS Take 1 tablet by mouth daily.   cholecalciferol (VITAMIN D) 25 MCG (1000 UNIT) tablet Take 1,000 Units by mouth daily.   Coenzyme Q10 (COQ-10) 100 MG CAPS Take 100 mg by mouth daily.   docusate sodium (COLACE) 100 MG capsule Take 100 mg by mouth daily as needed for mild constipation.   doxylamine, Sleep, (UNISOM) 25 MG tablet Take 25 mg by mouth at bedtime as needed for sleep.   escitalopram (LEXAPRO) 10 MG tablet Take 1 tablet by mouth once daily   lovastatin (MEVACOR) 40 MG tablet Take 1 tablet (40 mg total) by mouth at bedtime.   No facility-administered encounter medications on file as of 05/18/2021.    Allergies (verified) Patient has no known allergies.   History: Past Medical History:  Diagnosis Date   Anxiety    Cancer ([place])    skin   Diabetes mellitus without complication  ([place])    Hyperlipidemia    Syncope and collapse 05/02/2019   Past Surgical History:  Procedure Laterality Date   BREAST SURGERY     breast reduction   CATARACT EXTRACTION W/PHACO Right 10/18/2017   Procedure: CATARACT EXTRACTION PHACO AND INTRAOCULAR LENS PLACEMENT ([place]);  Surgeon: Birder Robson, MD;  Location: [facility] ORS;  Service: Ophthalmology;  Laterality: Right;  [place] 00:27 AP% 19.7 CDE5.32 fluid pack lot # [id number]   CATARACT EXTRACTION W/PHACO Left 11/21/2018   Procedure: CATARACT EXTRACTION PHACO AND INTRAOCULAR LENS PLACEMENT ([facility]);  Surgeon: Birder Robson, MD;  Location: [facility] ORS;  Service: Ophthalmology;  Laterality: Left;  [place] 00:31.3 CDE 5.38 Fluid Pack Lot # [id number]   CESAREAN SECTION     REDUCTION MAMMAPLASTY     smr     Family History  Problem Relation Age of Onset   Colitis Mother 91   Mental retardation Father 35   Alzheimer's disease Father    Heart Problems Brother    Transient ischemic attack Sister    Cancer Neg Hx    Breast cancer Neg Hx    Social History   Socioeconomic History   Marital status: Married    Spouse name: Not on file   Number of children: 3   Years of education: Not on file   Highest education level: Not on file  Occupational History   Not on file  Tobacco Use   Smoking status:  Passive Smoke Exposure - Never Smoker   Smokeless tobacco: Never Used   Tobacco comment: Husband smoked x 38 years (quit 4 years ago)  Vaping Use   Vaping Use: Never used  Substance and Sexual Activity   Alcohol use: Yes    Comment: Occasional   Drug use: No   Sexual activity: Not on file  Other Topics Concern   Not on file  Social History Narrative   [place] with husband; never smoked; ocassional wine; 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    Social Determinants of Radio broadcast assistant Strain: Low Risk    Difficulty of Paying Living Expenses: Not hard at all  Food Insecurity: No Food Insecurity   Worried About Charity fundraiser in the  Last Year: Never true   Arboriculturist in the Last Year: Never true  Transportation Needs: No Transportation Needs   Lack of Transportation (Medical): No   Lack of Transportation (Non-Medical): No  Physical Activity: Sufficiently Active   Days of Exercise per Week: 5 days   Minutes of Exercise per Session: 30 min  Stress: No Stress Concern Present   Feeling of Stress : Not at all  Social Connections: Unknown   Frequency of Communication with Friends and Family: More than three times a week   Frequency of Social Gatherings with Friends and Family: More than three times a week   Attends Religious Services: Not on Electrical engineer or Organizations: Not on file   Attends Archivist Meetings: Not on file   Marital Status: Not on file    Tobacco Counseling Counseling given: Not Answered Comment: Husband smoked x 38 years (quit 4 years ago)   Clinical Intake:  Pre-visit preparation completed: Yes        Diabetes:  (Diet controlled. Followed by pcp.)  Denies wounds that are not healing, chronic care management, financial strain with medical device/medications, need for nutritional assistance.   How often do you need to have someone help you when you read instructions, pamphlets, or other written materials from your doctor or pharmacy?: 1 - Never  Interpreter Needed?: No      Activities of Daily Living In your present state of health, do you have any difficulty performing the following activities: 05/18/2021  Hearing? N  Vision? N  Difficulty concentrating or making decisions? N  Walking or climbing stairs? N  Dressing or bathing? N  Doing errands, shopping? N  Preparing Food and eating ? N  Using the Toilet? N  In the past six months, have you accidently leaked urine? N  Do you have problems with loss of bowel control? N  Managing your Medications? N  Managing your Finances? N  Housekeeping or managing your Housekeeping? N  Some recent data might  be hidden    Patient Care Team: Crecencio Mc, MD as PCP - General (Internal Medicine) Minna Merritts, MD as PCP - Cardiology (Cardiology) Telford Nab, RN as Registered Nurse  Indicate any recent Medical Services you may have received from other than [facility] providers in the past year (date may be approximate).     Assessment:   This is a routine wellness examination for Alexis James.  Hearing/Vision screen  Hearing Screening   125Hz  250Hz  500Hz  1000Hz  2000Hz  3000Hz  4000Hz  6000Hz  8000Hz   Right ear:           Left ear:           Comments: Patient is able to hear  conversational tones without difficulty.  No issues reported.   Vision Screening Comments: Wears corrective lenses when reading Cataract extraction, bilateral They have seen their ophthalmologist in the last 12 months.     Dietary issues and exercise activities discussed: Current Exercise Habits: Home exercise routine, Type of exercise: walking, Time (Minutes): 30, Frequency (Times/Week): 5, Weekly Exercise (Minutes/Week): 150, Intensity: Mild  Regular diet Good fluid intake  Goals Addressed               This Visit's Progress     Patient Stated     COMPLETED: I need to walk more (pt-stated)   On track     Walk for exercise 5 days weekly, 30 minutes       COMPLETED: I should do better with drinking water (pt-stated)        Stay hydrated. Increase water intake.        Depression Screen PHQ 2/9 Scores 05/18/2021 02/04/2021 03/28/2020 06/07/2019 04/05/2018 11/23/2016 03/28/2015  PHQ - 2 Score 0 0 0 0 0 0 0  PHQ- 9 Score - - - - 1 - -    Fall Risk Fall Risk  05/18/2021 02/04/2021 07/03/2020 03/28/2020 01/30/2020  Falls in the past year? 0 1 1 0 0  Comment - - - - -  Number falls in past yr: 0 0 0 - -  Comment - - - - -  Injury with Fall? 0 0 0 - -  Risk for fall due to : - - - - -  Follow up Falls evaluation completed Falls evaluation completed Falls evaluation completed Falls evaluation completed Falls evaluation  completed    [place]: Handrails in use when climbing stars? Yes Home free of loose throw rugs in walkways, pet beds, electrical cords, etc? Yes  Adequate lighting in your home to reduce risk of falls? Yes   ASSISTIVE DEVICES UTILIZED TO PREVENT FALLS: Life alert? No  Use of a cane, walker or w/c? No   TIMED UP AND GO: Was the test performed? Yes .  Length of time to ambulate 10 feet: 10 sec.   Gait steady and fast without use of assistive device  Cognitive Function: Patient is alert and oriented x3.  Denies difficulty focusing, making decisions, memory loss.  MMSE/6CIT deferred. Normal by direct communication/observation.      6CIT Screen 03/28/2020  What Year? 0 points  What month? 0 points  What time? 0 points  Months in reverse 0 points    Immunizations Immunization History  Administered Date(s) Administered   Influenza Split 10/15/2011, 10/30/2014, 11/03/2015   Influenza, High Dose Seasonal PF 09/28/2017, 09/01/2019   Influenza-Unspecified 08/13/2012, 09/05/2018, 09/10/2020   PFIZER(Purple Top)SARS-COV-2 Vaccination 01/24/2020, 02/18/2020, 10/10/2020   Pneumococcal Conjugate-13 04/05/2018   Pneumococcal Polysaccharide-23 04/02/2016, 09/01/2019   Tdap 04/17/2015   Zoster Recombinat (Shingrix) 04/05/2017, 06/28/2017   Zoster, Live 03/26/2010   Health Maintenance Health Maintenance  Topic Date Due   Fecal DNA (Cologuard)  Never done   INFLUENZA VACCINE  07/13/2021   MAMMOGRAM  08/25/2022   TETANUS/TDAP  04/16/2025   DEXA SCAN  Completed   COVID-19 Vaccine  Completed   Hepatitis C Screening  Completed   PNA vac Low Risk Adult  Completed   Zoster Vaccines- Shingrix  Completed   Pneumococcal Vaccine 42-60 Years old  Aged Out   HPV VACCINES  Aged Out   Cologuard- patient consent given. Information provided.   Mammogram status: Completed 08/2020. Ordered per consent. Marland Kitchen  Repeat every year  CT CHEST WO contrast -completed  08/2020  Vision Screening: Recommended annual ophthalmology exams for early detection of glaucoma and other disorders of the eye. Is the patient up to date with their annual eye exam?  Yes   Dental Screening: Recommended annual dental exams for proper oral hygiene.  Community Resource Referral / Chronic Care Management: CRR required this visit?  No   CCM required this visit?  No      Plan:   Keep all routine maintenance appointments.   I have personally reviewed and noted the following in the patient's chart:   Medical and social history Use of alcohol, tobacco or illicit drugs  Current medications and supplements including opioid prescriptions.  Functional ability and status Nutritional status Physical activity Advanced directives List of other physicians Hospitalizations, surgeries, and ER visits in previous 12 months Vitals Screenings to include cognitive, depression, and falls Referrals and appointments  In addition, I have reviewed and discussed with patient certain preventive protocols, quality metrics, and best practice recommendations. A written personalized care plan for preventive services as well as general preventive health recommendations were provided to patient via mychart.     OBrien-Blaney, Patty Leitzke L, LPN   05/15/7857      I have reviewed the above information and agree with above.   Deborra Medina, MD

## 2021-05-25 ENCOUNTER — Telehealth: Payer: Self-pay | Admitting: Internal Medicine

## 2021-05-25 DIAGNOSIS — E7800 Pure hypercholesterolemia, unspecified: Secondary | ICD-10-CM

## 2021-05-25 NOTE — Telephone Encounter (Signed)
Pt wanted a call back to discuss the lovastatin (MEVACOR) 40 MG tablet

## 2021-05-26 NOTE — Telephone Encounter (Signed)
PT called to advise that she is returning the missed call

## 2021-05-26 NOTE — Telephone Encounter (Signed)
Pt called to let Dr. Derrel Nip know that Dr. Rockey Situ switched pt back to Lovastatin instead of the Atorvastatin.

## 2021-05-26 NOTE — Telephone Encounter (Signed)
LMTCB

## 2021-06-22 ENCOUNTER — Ambulatory Visit (INDEPENDENT_AMBULATORY_CARE_PROVIDER_SITE_OTHER): Payer: PPO

## 2021-06-22 ENCOUNTER — Other Ambulatory Visit: Payer: Self-pay

## 2021-06-22 DIAGNOSIS — E538 Deficiency of other specified B group vitamins: Secondary | ICD-10-CM

## 2021-06-22 MED ORDER — CYANOCOBALAMIN 1000 MCG/ML IJ SOLN
1000.0000 ug | Freq: Once | INTRAMUSCULAR | Status: AC
  Administered 2021-06-22: 1000 ug via INTRAMUSCULAR

## 2021-06-22 NOTE — Progress Notes (Signed)
Patient presented for B 12 injection to left deltoid, patient voiced no concerns nor showed any signs of distress during injection. 

## 2021-07-23 ENCOUNTER — Other Ambulatory Visit: Payer: Self-pay

## 2021-07-23 ENCOUNTER — Ambulatory Visit (INDEPENDENT_AMBULATORY_CARE_PROVIDER_SITE_OTHER): Payer: PPO

## 2021-07-23 DIAGNOSIS — E538 Deficiency of other specified B group vitamins: Secondary | ICD-10-CM | POA: Diagnosis not present

## 2021-07-23 MED ORDER — CYANOCOBALAMIN 1000 MCG/ML IJ SOLN
1000.0000 ug | Freq: Once | INTRAMUSCULAR | Status: AC
  Administered 2021-07-23: 1000 ug via INTRAMUSCULAR

## 2021-07-23 NOTE — Progress Notes (Signed)
Patient presented for B 12 injection to right deltoid, patient voiced no concerns nor showed any signs of distress during injection. 

## 2021-07-24 ENCOUNTER — Encounter: Payer: Self-pay | Admitting: Internal Medicine

## 2021-08-05 ENCOUNTER — Ambulatory Visit (INDEPENDENT_AMBULATORY_CARE_PROVIDER_SITE_OTHER): Payer: PPO | Admitting: Internal Medicine

## 2021-08-05 ENCOUNTER — Encounter: Payer: Self-pay | Admitting: Internal Medicine

## 2021-08-05 ENCOUNTER — Other Ambulatory Visit: Payer: Self-pay

## 2021-08-05 VITALS — BP 110/58 | HR 70 | Temp 96.4°F | Resp 14 | Ht 62.0 in | Wt 133.6 lb

## 2021-08-05 DIAGNOSIS — R600 Localized edema: Secondary | ICD-10-CM | POA: Diagnosis not present

## 2021-08-05 DIAGNOSIS — I700 Atherosclerosis of aorta: Secondary | ICD-10-CM | POA: Diagnosis not present

## 2021-08-05 DIAGNOSIS — E538 Deficiency of other specified B group vitamins: Secondary | ICD-10-CM

## 2021-08-05 DIAGNOSIS — E782 Mixed hyperlipidemia: Secondary | ICD-10-CM

## 2021-08-05 DIAGNOSIS — Z9229 Personal history of other drug therapy: Secondary | ICD-10-CM | POA: Diagnosis not present

## 2021-08-05 DIAGNOSIS — R7303 Prediabetes: Secondary | ICD-10-CM | POA: Diagnosis not present

## 2021-08-05 DIAGNOSIS — Z1211 Encounter for screening for malignant neoplasm of colon: Secondary | ICD-10-CM

## 2021-08-05 DIAGNOSIS — N951 Menopausal and female climacteric states: Secondary | ICD-10-CM | POA: Diagnosis not present

## 2021-08-05 DIAGNOSIS — E785 Hyperlipidemia, unspecified: Secondary | ICD-10-CM | POA: Diagnosis not present

## 2021-08-05 LAB — COMPREHENSIVE METABOLIC PANEL
ALT: 16 U/L (ref 0–35)
AST: 21 U/L (ref 0–37)
Albumin: 3.9 g/dL (ref 3.5–5.2)
Alkaline Phosphatase: 61 U/L (ref 39–117)
BUN: 22 mg/dL (ref 6–23)
CO2: 28 mEq/L (ref 19–32)
Calcium: 9.3 mg/dL (ref 8.4–10.5)
Chloride: 104 mEq/L (ref 96–112)
Creatinine, Ser: 0.78 mg/dL (ref 0.40–1.20)
GFR: 76.52 mL/min (ref >60.00)
Glucose, Bld: 82 mg/dL (ref 70–99)
Potassium: 4.2 mEq/L (ref 3.5–5.1)
Sodium: 140 mEq/L (ref 135–145)
Total Bilirubin: 0.6 mg/dL (ref 0.2–1.2)
Total Protein: 6.2 g/dL (ref 6.0–8.3)

## 2021-08-05 LAB — LIPID PANEL
Cholesterol: 177 mg/dL (ref 0–200)
HDL: 62.3 mg/dL (ref >39.00)
LDL Cholesterol: 94 mg/dL (ref 0–99)
NonHDL: 114.5
Total CHOL/HDL Ratio: 3
Triglycerides: 103 mg/dL (ref 0.0–149.0)
VLDL: 20.6 mg/dL (ref 0.0–40.0)

## 2021-08-05 LAB — VITAMIN B12: Vitamin B-12: 320 pg/mL (ref 211–911)

## 2021-08-05 LAB — HEMOGLOBIN A1C: Hgb A1c MFr Bld: 6.2 % (ref 4.6–6.5)

## 2021-08-05 MED ORDER — FUROSEMIDE 20 MG PO TABS: 20.0000 mg | ORAL_TABLET | Freq: Every day | ORAL | 1 refills | Status: DC

## 2021-08-05 NOTE — Progress Notes (Signed)
Subjective:  Patient ID: Alexis James, female    DOB: 08-28-1951  Age: 69 y.o. MRN: [id number]  CC: The primary encounter diagnosis was Screening for colon cancer. Diagnoses of Aortic atherosclerosis ([place]), B12 deficiency, COVID-19 vaccine series completed, Prediabetes, Hyperlipidemia LDL goal <100, Menopausal flushing, Edema of both lower extremities, and Mixed hyperlipidemia were also pertinent to this visit.  HPI Alexis James presents for 6 moth follow up on chronic issues  This visit occurred during the SARS-CoV-2 public health emergency.  Safety protocols were in place, including screening questions prior to the visit, additional usage of staff PPE, and extensive cleaning of exam room while observing appropriate contact time as indicated for disinfecting solutions.    1)  Aortic atherosclerosis:  REVIEWED recent  CT with patient and Dr Rockey Situ who felt that due to minimal placque,  a change to a higher potency statin was not necessary   1) right ear fullness and hearing loss.  Hearing test at last cleaning several years ago  was fine  2) fluid retention  making ankles look fatter , also has fatty ankles  3) anxiety and menopause: aggravated by life stressors.   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     cholecalciferol (VITAMIN D) 25 MCG (1000 UNIT) tablet Take 1,000 Units by mouth daily.     escitalopram (LEXAPRO) 10 MG tablet Take 1 tablet by mouth once daily 90 tablet 2   lovastatin (MEVACOR) 40 MG tablet Take 1 tablet (40 mg total) by mouth at bedtime. 90 tablet 3   acetaminophen (TYLENOL) 500 MG tablet Take 1,000 mg by mouth every 6 (six) hours as needed for moderate pain. (Patient not taking: Reported on 08/05/2021)      Coenzyme Q10 (COQ-10) 100 MG CAPS Take 100 mg by mouth daily. (Patient not taking: Reported on 08/05/2021)     docusate sodium (COLACE) 100 MG capsule Take 100 mg by mouth daily as needed for mild constipation. (Patient not taking: Reported on 08/05/2021)     doxylamine, Sleep, (UNISOM) 25 MG tablet Take 25 mg by mouth at bedtime as needed for sleep. (Patient not taking: Reported on 08/05/2021)     No facility-administered medications prior to visit.    Review of Systems;  Patient denies headache, fevers, malaise, unintentional weight loss, skin rash, eye pain, sinus congestion and sinus pain, sore throat, dysphagia,  hemoptysis , cough, dyspnea, wheezing, chest pain, palpitations, orthopnea, edema, abdominal pain, nausea, melena, diarrhea, constipation, flank pain, dysuria, hematuria, urinary  Frequency, nocturia, numbness, tingling, seizures,  Focal weakness, Loss of consciousness,  Tremor, insomnia, depression,  , and suicidal ideation.      Objective:  BP (!) 110/58 (BP Location: Left Arm, Patient Position: Sitting, Cuff Size: Normal)   Pulse 70   Temp (!) 96.4 F (35.8 C) (Temporal)   Resp 14   Ht '5\' 2"'$  (1.575 m)   Wt 133 lb 9.6 oz (60.6 kg)   SpO2 92%   BMI 24.44 kg/m   BP Readings from Last 3 Encounters:  08/05/21 (!) 110/58  05/18/21 106/63  03/18/21 120/62    Wt Readings from Last 3 Encounters:  08/05/21 133 lb 9.6 oz (60.6 kg)  05/18/21 133 lb (60.3 kg)  03/18/21  132 lb 8 oz (60.1 kg)    General appearance: alert, cooperative and appears stated age Ears: normal TM's and external ear canals both ears Throat: lips, mucosa, and tongue normal; teeth and gums normal Neck: no adenopathy, no carotid bruit, supple, symmetrical, trachea midline and thyroid not enlarged, symmetric, no tenderness/mass/nodules Back: symmetric, no curvature. ROM normal. No CVA tenderness. Lungs: clear to auscultation bilaterally Heart: regular rate and rhythm, S1, S2 normal, no murmur, click,  rub or gallop Abdomen: soft, non-tender; bowel sounds normal; no masses,  no organomegaly Pulses: 2+ and symmetric Skin: Skin color, texture, turgor normal. No rashes or lesions Lymph nodes: Cervical, supraclavicular, and axillary nodes normal.  Lab Results  Component Value Date   HGBA1C 6.2 08/05/2021   HGBA1C 6.1 02/04/2021   HGBA1C 6.2 07/03/2020    Lab Results  Component Value Date   CREATININE 0.78 08/05/2021   CREATININE 0.81 02/04/2021   CREATININE 0.86 07/03/2020    Lab Results  Component Value Date   WBC 3.8 (L) 07/04/2019   HGB 12.5 07/04/2019   HCT 37.8 07/04/2019   PLT 241 07/04/2019   GLUCOSE 82 08/05/2021   CHOL 177 08/05/2021   TRIG 103.0 08/05/2021   HDL 62.30 08/05/2021   LDLDIRECT 94.3 03/26/2014   LDLCALC 94 08/05/2021   ALT 16 08/05/2021   AST 21 08/05/2021   NA 140 08/05/2021   K 4.2 08/05/2021   CL 104 08/05/2021   CREATININE 0.78 08/05/2021   BUN 22 08/05/2021   CO2 28 08/05/2021   TSH 3.05 02/04/2021   HGBA1C 6.2 08/05/2021    CT Chest Wo Contrast  Result Date: 11/19/2020 CLINICAL DATA:  Lung nodule EXAM: CT CHEST WITHOUT CONTRAST TECHNIQUE: Multidetector CT imaging of the chest was performed following the standard protocol without IV contrast. COMPARISON:  11/16/2019, 07/04/2019 FINDINGS: Cardiovascular: Aortic atherosclerosis. Normal heart size. No pericardial effusion. Mediastinum/Nodes: No enlarged mediastinal, hilar, or axillary lymph nodes. Thyroid gland, trachea, and esophagus demonstrate no significant findings. Lungs/Pleura: Redemonstrated bandlike scarring of the bilateral lung bases with mild tubular bronchiectasis. Stable small pulmonary nodules measuring 3 mm and smaller, including a 3 mm nodule of the posterior right pulmonary apex (series 3, image 49) and a 3 mm nodule of the posterior right upper lobe (series 3, image 96). No pleural effusion or pneumothorax. Upper Abdomen: No acute abnormality. Musculoskeletal: No chest wall mass  or suspicious bone lesions identified. IMPRESSION: 1. Stable small pulmonary nodules measuring 3 mm and smaller, definitively benign. No further routine CT follow-up is required. 2. Redemonstrated bandlike scarring of the bilateral lung bases with mild tubular bronchiectasis, consistent with sequelae of prior infection or inflammation. Aortic Atherosclerosis (ICD10-I70.0). Electronically Signed   By: Eddie Candle M.D.   On: 11/19/2020 09:03    Assessment & Plan:   Problem List Items Addressed This Visit       Unprioritized   Aortic atherosclerosis ([place])    Taking lovastatin 40 mg .  The plaque is minimal per Dr Rockey Situ,  No change to high potency statin needed       Relevant Medications   furosemide (LASIX) 20 MG tablet   B12 deficiency    Managed parenterally due to positive  IF antibody .  Continue monthly injections.  Lab Results  Component Value Date   VITAMINB12 320 08/05/2021        Relevant Orders   Vitamin B12 (Completed)   Edema of both lower extremities    Limited to her ankles.  Non pitting,  Normal ECHO within the last 3 years.  Prn furosemide not more than 2/week      Hyperlipidemia    Managed with lovastatin.  Goal LDL < 100.  No changes needed.   Lab Results  Component Value Date   CHOL 177 08/05/2021   HDL 62.30 08/05/2021   LDLCALC 94 08/05/2021   LDLDIRECT 94.3 03/26/2014   TRIG 103.0 08/05/2021   CHOLHDL 3 08/05/2021   Lab Results  Component Value Date   ALT 16 08/05/2021   AST 21 08/05/2021   ALKPHOS 61 08/05/2021   BILITOT 0.6 08/05/2021         Relevant Medications   furosemide (LASIX) 20 MG tablet   Menopausal flushing    Continue lexapro 10 mg daily  For mitigation of hot flashes and management of anxiety       Relevant Medications   furosemide (LASIX) 20 MG tablet   Prediabetes    Her  random glucose is not  elevated but her A1c continues to suggest she is at increased  risk for developing diabetes.  I recommend she follow a low  glycemic index diet and participate regularly in an aerobic  exercise activity.  We should check an A1c in 6 months.    Lab Results  Component Value Date   HGBA1C 6.2 08/05/2021         Relevant Orders   Comprehensive metabolic panel (Completed)   Hemoglobin A1c (Completed)   Screening for colon cancer - Primary   Relevant Orders   Cologuard   Other Visit Diagnoses     COVID-19 vaccine series completed       Relevant Orders   SARS-CoV-2 Semi-Quantitative Total Antibody, Spike (Completed)   Hyperlipidemia LDL goal <100       Relevant Medications   furosemide (LASIX) 20 MG tablet   Other Relevant Orders   Lipid panel (Completed)      I spent 30 minutes dedicated to the care of this patient on the date of this encounter to include pre-visit review of his medical history,  Face-to-face time with the patient , and post visit ordering of testing and therapeutics.  Meds ordered this encounter  Medications   furosemide (LASIX) 20 MG tablet    Sig: Take 1 tablet (20 mg total) by mouth daily. As needed for fluid retention    Dispense:  15 tablet    Refill:  1    Medications Discontinued During This Encounter  Medication Reason   doxylamine, Sleep, (UNISOM) 25 MG tablet    docusate sodium (COLACE) 100 MG capsule    Coenzyme Q10 (COQ-10) 100 MG CAPS    acetaminophen (TYLENOL) 500 MG tablet     Follow-up: No follow-ups on file.   Crecencio Mc, MD

## 2021-08-05 NOTE — Assessment & Plan Note (Signed)
Taking lovastatin 40 mg .  The plaque is minimal per Dr Rockey Situ,  No change to high potency statin needed

## 2021-08-05 NOTE — Patient Instructions (Signed)
Cologuard has been ordered   B12 level and COVID antibody levels have been ordered   Continue lexapro for anxiety and hot flashes    Use the furosemide not more than every other day flor fluid retention

## 2021-08-08 ENCOUNTER — Encounter: Payer: Self-pay | Admitting: Internal Medicine

## 2021-08-08 DIAGNOSIS — R600 Localized edema: Secondary | ICD-10-CM | POA: Insufficient documentation

## 2021-08-08 LAB — SARS-COV-2 SEMI-QUANTITATIVE TOTAL ANTIBODY, SPIKE: SARS COV2 AB, Total Spike Semi QN: 2063 U/mL — ABNORMAL HIGH (ref <0.8)

## 2021-08-08 NOTE — Assessment & Plan Note (Addendum)
Managed parenterally due to positive  IF antibody .  Continue monthly injections.  Lab Results  Component Value Date   VITAMINB12 320 08/05/2021

## 2021-08-08 NOTE — Assessment & Plan Note (Signed)
Her  random glucose is not  elevated but her A1c continues to suggest she is at increased  risk for developing diabetes.  I recommend she follow a low glycemic index diet and participate regularly in an aerobic  exercise activity.  We should check an A1c in 6 months.    Lab Results  Component Value Date   HGBA1C 6.2 08/05/2021

## 2021-08-08 NOTE — Assessment & Plan Note (Addendum)
Continue lexapro 10 mg daily  For mitigation of hot flashes and management of anxiety

## 2021-08-08 NOTE — Assessment & Plan Note (Signed)
Limited to her ankles.  Non pitting,  Normal ECHO within the last 3 years.  Prn furosemide not more than 2/week

## 2021-08-08 NOTE — Assessment & Plan Note (Signed)
Managed with lovastatin.  Goal LDL < 100.  No changes needed.   Lab Results  Component Value Date   CHOL 177 08/05/2021   HDL 62.30 08/05/2021   LDLCALC 94 08/05/2021   LDLDIRECT 94.3 03/26/2014   TRIG 103.0 08/05/2021   CHOLHDL 3 08/05/2021   Lab Results  Component Value Date   ALT 16 08/05/2021   AST 21 08/05/2021   ALKPHOS 61 08/05/2021   BILITOT 0.6 08/05/2021

## 2021-08-10 DIAGNOSIS — H903 Sensorineural hearing loss, bilateral: Secondary | ICD-10-CM | POA: Diagnosis not present

## 2021-08-10 DIAGNOSIS — H6123 Impacted cerumen, bilateral: Secondary | ICD-10-CM | POA: Diagnosis not present

## 2021-08-18 DIAGNOSIS — Z1211 Encounter for screening for malignant neoplasm of colon: Secondary | ICD-10-CM | POA: Diagnosis not present

## 2021-08-20 ENCOUNTER — Other Ambulatory Visit: Payer: Self-pay | Admitting: Internal Medicine

## 2021-08-21 LAB — COLOGUARD: Cologuard: POSITIVE — AB

## 2021-08-25 ENCOUNTER — Other Ambulatory Visit: Payer: Self-pay

## 2021-08-25 ENCOUNTER — Ambulatory Visit: Payer: PPO

## 2021-08-25 ENCOUNTER — Ambulatory Visit (INDEPENDENT_AMBULATORY_CARE_PROVIDER_SITE_OTHER): Payer: PPO

## 2021-08-25 VITALS — Temp 98.5°F

## 2021-08-25 DIAGNOSIS — E538 Deficiency of other specified B group vitamins: Secondary | ICD-10-CM

## 2021-08-25 MED ORDER — CYANOCOBALAMIN 1000 MCG/ML IJ SOLN
1000.0000 ug | Freq: Once | INTRAMUSCULAR | Status: AC
  Administered 2021-08-25: 1000 ug via INTRAMUSCULAR

## 2021-08-25 NOTE — Progress Notes (Signed)
Patient came in today for B-12 injection given in left deltoid IM. Patient tolerated well with no signs of distress.

## 2021-08-26 ENCOUNTER — Other Ambulatory Visit: Payer: Self-pay | Admitting: Internal Medicine

## 2021-08-26 DIAGNOSIS — R195 Other fecal abnormalities: Secondary | ICD-10-CM | POA: Insufficient documentation

## 2021-08-26 NOTE — Progress Notes (Signed)
GI REFERRAL IN PROGRESS '

## 2021-08-27 ENCOUNTER — Other Ambulatory Visit: Payer: Self-pay

## 2021-08-27 ENCOUNTER — Ambulatory Visit
Admission: RE | Admit: 2021-08-27 | Discharge: 2021-08-27 | Disposition: A | Discharge status: Home / Self Care | Payer: PPO | Source: Ambulatory Visit | Attending: Internal Medicine | Admitting: Internal Medicine

## 2021-08-27 DIAGNOSIS — Z1231 Encounter for screening mammogram for malignant neoplasm of breast: Secondary | ICD-10-CM | POA: Diagnosis not present

## 2021-08-27 DIAGNOSIS — Z1211 Encounter for screening for malignant neoplasm of colon: Secondary | ICD-10-CM

## 2021-08-31 ENCOUNTER — Other Ambulatory Visit: Payer: Self-pay

## 2021-08-31 ENCOUNTER — Telehealth: Payer: Self-pay

## 2021-08-31 NOTE — Telephone Encounter (Signed)
Called patient no answer voicemail was left and letter sent

## 2021-09-04 ENCOUNTER — Telehealth: Payer: Self-pay

## 2021-09-04 ENCOUNTER — Other Ambulatory Visit: Payer: Self-pay | Admitting: Internal Medicine

## 2021-09-04 NOTE — Telephone Encounter (Signed)
Pt. Calling to schedule colonoscopy 

## 2021-09-08 ENCOUNTER — Other Ambulatory Visit: Payer: Self-pay

## 2021-09-08 DIAGNOSIS — R195 Other fecal abnormalities: Secondary | ICD-10-CM

## 2021-09-08 MED ORDER — CLENPIQ 10-3.5-12 MG-GM -GM/160ML PO SOLN: 1.0000 | Freq: Once | ORAL | 0 refills | Status: AC

## 2021-09-08 NOTE — Progress Notes (Signed)
Gastroenterology Pre-Procedure Review  Request Date: 10/13/21 Requesting Physician: Dr. Allen Norris  PATIENT REVIEW QUESTIONS: The patient responded to the following health history questions as indicated:    1. Are you having any GI issues?  Some constipation  2. Do you have a personal history of Polyps?  Last colonoscopy was 02/11/2011. Positive Cologuard Test 3. Do you have a family history of Colon Cancer or Polyps? no 4. Diabetes Mellitus? no 5. Joint replacements in the past 12 months?no 6. Major health problems in the past 3 months?no 7. Any artificial heart valves, MVP, or defibrillator?no    MEDICATIONS & ALLERGIES:    Patient reports the following regarding taking any anticoagulation/antiplatelet therapy:   Plavix, Coumadin, Eliquis, Xarelto, Lovenox, Pradaxa, Brilinta, or Effient? no Aspirin? no  Patient confirms/reports the following medications:  Current Outpatient Medications  Medication Sig Dispense Refill   Calcium Carbonate-Vitamin D3 600-400 MG-UNIT TABS Take 1 tablet by mouth daily.     cholecalciferol (VITAMIN D) 25 MCG (1000 UNIT) tablet Take 1,000 Units by mouth daily.     escitalopram (LEXAPRO) 10 MG tablet Take 1 tablet by mouth once daily 90 tablet 0   furosemide (LASIX) 20 MG tablet Take 1 tablet (20 mg total) by mouth daily. As needed for fluid retention 15 tablet 1   lovastatin (MEVACOR) 40 MG tablet Take 1 tablet (40 mg total) by mouth at bedtime. 90 tablet 3   No current facility-administered medications for this visit.    Patient confirms/reports the following allergies:  No Known Allergies  No orders of the defined types were placed in this encounter.   AUTHORIZATION INFORMATION Primary Insurance: 1D#: Group #:  Secondary Insurance: 1D#: Group #:  SCHEDULE INFORMATION: Date: 10/13/21 Time: Location: [facility]

## 2021-09-08 NOTE — Telephone Encounter (Signed)
Procedure has been scheduled for 10/13/21.

## 2021-09-14 ENCOUNTER — Telehealth: Payer: Self-pay | Admitting: Internal Medicine

## 2021-09-14 ENCOUNTER — Encounter: Payer: Self-pay | Admitting: Pulmonary Disease

## 2021-09-14 NOTE — Telephone Encounter (Signed)
Positive home covid test patient has Temp 99.7 ,scratchy throat ,tired loss of appetite, dull headache

## 2021-09-14 NOTE — Telephone Encounter (Signed)
Access nurse instructed patient on home care advise. Also stated pagtient needs to be evaluated with PCP within 24 hours. Patient has an appointment with [facility] tomorrow.

## 2021-09-15 ENCOUNTER — Telehealth: Payer: Self-pay | Admitting: Internal Medicine

## 2021-09-15 ENCOUNTER — Telehealth: Payer: PPO | Admitting: Family

## 2021-09-15 NOTE — Telephone Encounter (Signed)
Chart updated

## 2021-09-15 NOTE — Telephone Encounter (Signed)
Spoken to patient, she refused to have a virtual appointment currently to receive the antiviral. She stated her only sx is sinus drainage and feeling like she has a slight cold that is it.

## 2021-09-15 NOTE — Telephone Encounter (Signed)
Providing access nurse documentation.      

## 2021-09-15 NOTE — Telephone Encounter (Signed)
Patient would like for Dr.Tullo to know that she got her flu shot on 09/03/21 at the [facility] in [place].

## 2021-09-15 NOTE — Telephone Encounter (Signed)
Patient has been informed.

## 2021-09-15 NOTE — Telephone Encounter (Signed)
Patient called in stating that she talked with Ms.Alexis James yesterday,she is feeling better and wants to cancel her appointment.She is also calling in to request the antiviral medicine,please advise.

## 2021-09-16 ENCOUNTER — Telehealth: Payer: Self-pay

## 2021-09-16 NOTE — Telephone Encounter (Signed)
Procedure rescheduled to 10/27/21. Per request of patient. Endo unit has been notified and updated instructions will be sent.

## 2021-09-16 NOTE — Telephone Encounter (Signed)
Pt. Calling to reschedule colonoscopy. She says something "may" come up then.

## 2021-09-25 ENCOUNTER — Ambulatory Visit (INDEPENDENT_AMBULATORY_CARE_PROVIDER_SITE_OTHER): Payer: PPO

## 2021-09-25 ENCOUNTER — Other Ambulatory Visit: Payer: Self-pay

## 2021-09-25 DIAGNOSIS — E538 Deficiency of other specified B group vitamins: Secondary | ICD-10-CM

## 2021-09-25 MED ORDER — CYANOCOBALAMIN 1000 MCG/ML IJ SOLN
1000.0000 ug | Freq: Once | INTRAMUSCULAR | Status: AC
  Administered 2021-09-25: 1000 ug via INTRAMUSCULAR

## 2021-09-25 NOTE — Progress Notes (Signed)
Patient presented for B 12 injection to left deltoid, patient voiced no concerns nor showed any signs of distress during injection. Krisann Mckenna,cma  

## 2021-10-23 DIAGNOSIS — D2261 Melanocytic nevi of right upper limb, including shoulder: Secondary | ICD-10-CM | POA: Diagnosis not present

## 2021-10-23 DIAGNOSIS — D2271 Melanocytic nevi of right lower limb, including hip: Secondary | ICD-10-CM | POA: Diagnosis not present

## 2021-10-23 DIAGNOSIS — C44319 Basal cell carcinoma of skin of other parts of face: Secondary | ICD-10-CM | POA: Diagnosis not present

## 2021-10-23 DIAGNOSIS — D485 Neoplasm of uncertain behavior of skin: Secondary | ICD-10-CM | POA: Diagnosis not present

## 2021-10-23 DIAGNOSIS — D2262 Melanocytic nevi of left upper limb, including shoulder: Secondary | ICD-10-CM | POA: Diagnosis not present

## 2021-10-23 DIAGNOSIS — R58 Hemorrhage, not elsewhere classified: Secondary | ICD-10-CM | POA: Diagnosis not present

## 2021-10-23 DIAGNOSIS — L820 Inflamed seborrheic keratosis: Secondary | ICD-10-CM | POA: Diagnosis not present

## 2021-10-23 DIAGNOSIS — Z85828 Personal history of other malignant neoplasm of skin: Secondary | ICD-10-CM | POA: Diagnosis not present

## 2021-10-26 ENCOUNTER — Encounter: Payer: Self-pay | Admitting: Gastroenterology

## 2021-10-27 ENCOUNTER — Encounter
Admission: RE | Disposition: A | Discharge status: Home / Self Care | Payer: Self-pay | Source: Home / Self Care | Attending: Gastroenterology

## 2021-10-27 ENCOUNTER — Other Ambulatory Visit: Payer: Self-pay

## 2021-10-27 ENCOUNTER — Encounter: Payer: Self-pay | Admitting: Gastroenterology

## 2021-10-27 ENCOUNTER — Ambulatory Visit: Payer: PPO | Admitting: Registered Nurse

## 2021-10-27 ENCOUNTER — Ambulatory Visit
Admission: RE | Admit: 2021-10-27 | Discharge: 2021-10-27 | Disposition: A | Discharge status: Home / Self Care | Payer: PPO | Attending: Gastroenterology | Admitting: Gastroenterology

## 2021-10-27 DIAGNOSIS — K5730 Diverticulosis of large intestine without perforation or abscess without bleeding: Secondary | ICD-10-CM | POA: Diagnosis not present

## 2021-10-27 DIAGNOSIS — K635 Polyp of colon: Secondary | ICD-10-CM | POA: Diagnosis not present

## 2021-10-27 DIAGNOSIS — R195 Other fecal abnormalities: Secondary | ICD-10-CM | POA: Diagnosis not present

## 2021-10-27 DIAGNOSIS — K640 First degree hemorrhoids: Secondary | ICD-10-CM | POA: Insufficient documentation

## 2021-10-27 DIAGNOSIS — D126 Benign neoplasm of colon, unspecified: Secondary | ICD-10-CM | POA: Diagnosis not present

## 2021-10-27 DIAGNOSIS — D123 Benign neoplasm of transverse colon: Secondary | ICD-10-CM | POA: Diagnosis not present

## 2021-10-27 HISTORY — PX: COLONOSCOPY WITH PROPOFOL: SHX5780

## 2021-10-27 SURGERY — COLONOSCOPY WITH PROPOFOL
Anesthesia: General

## 2021-10-27 MED ORDER — SODIUM CHLORIDE 0.9 % IV SOLN: INTRAVENOUS | Status: DC

## 2021-10-27 MED ORDER — PROPOFOL 500 MG/50ML IV EMUL
INTRAVENOUS | Status: DC | PRN
Start: 2021-10-27 — End: 2021-10-27
  Administered 2021-10-27: 140 ug/kg/min via INTRAVENOUS

## 2021-10-27 MED ORDER — PROPOFOL 10 MG/ML IV BOLUS
INTRAVENOUS | Status: DC | PRN
  Administered 2021-10-27: 70 mg via INTRAVENOUS

## 2021-10-27 NOTE — Anesthesia Postprocedure Evaluation (Signed)
Anesthesia Post Note  Patient: Alexis James  Procedure(s) Performed: COLONOSCOPY WITH PROPOFOL  Patient location during evaluation: Phase II Anesthesia Type: General Level of consciousness: awake and alert, awake and oriented Pain management: pain level controlled Vital Signs Assessment: post-procedure vital signs reviewed and stable Respiratory status: spontaneous breathing, nonlabored ventilation and respiratory function stable Cardiovascular status: blood pressure returned to baseline and stable Postop Assessment: no apparent nausea or vomiting Anesthetic complications: no   No notable events documented.   Last Vitals:  Vitals:   10/27/21 0910 10/27/21 0920  BP: 122/63 129/67  Pulse: 72 67  Resp: 13 14  Temp:    SpO2: 93% 100%    Last Pain:  Vitals:   10/27/21 0900  TempSrc: Temporal  PainSc:                  Phill Mutter

## 2021-10-27 NOTE — Anesthesia Preprocedure Evaluation (Signed)
Anesthesia Evaluation  Patient identified by MRN, date of birth, ID band Patient awake    Reviewed: Allergy & Precautions, NPO status , Patient's Chart, lab work & pertinent test results  History of Anesthesia Complications Negative for: history of anesthetic complications  Airway Mallampati: II  TM Distance: >3 FB Neck ROM: Full    Dental no notable dental hx.    Pulmonary neg pulmonary ROS, neg sleep apnea, neg COPD,    breath sounds clear to auscultation- rhonchi (-) wheezing      Cardiovascular Exercise Tolerance: Good (-) hypertension(-) CAD and (-) Past MI negative cardio ROS   Rhythm:Regular Rate:Normal - Systolic murmurs and - Diastolic murmurs    Neuro/Psych Anxiety negative neurological ROS     GI/Hepatic negative GI ROS, Neg liver ROS, Bowel prep,  Endo/Other  negative endocrine ROSdiabetes, Well Controlled  Renal/GU negative Renal ROS     Musculoskeletal negative musculoskeletal ROS (+)   Abdominal (+) - obese,   Peds  Hematology negative hematology ROS (+)   Anesthesia Other Findings Anxiety    Cancer (HCC)  skin  Diabetes mellitus without complication (HCC)    Hyperlipidemia    Orthostatic hypotension 07/10/2019 Syncope and collapse 05/02/2019       Reproductive/Obstetrics                            Anesthesia Physical  Anesthesia Plan  ASA: 2  Anesthesia Plan: General   Post-op Pain Management:    Induction: Intravenous  PONV Risk Score and Plan: 2 and TIVA and Propofol infusion  Airway Management Planned: Natural Airway and Nasal Cannula  Additional Equipment:   Intra-op Plan:   Post-operative Plan:   Informed Consent: I have reviewed the patients History and Physical, chart, labs and discussed the procedure including the risks, benefits and alternatives for the proposed anesthesia with the patient or authorized representative who has indicated his/her  understanding and acceptance.       Plan Discussed with: CRNA, Anesthesiologist and Surgeon  Anesthesia Plan Comments:        Anesthesia Quick Evaluation

## 2021-10-27 NOTE — Op Note (Signed)
[facility] Gastroenterology Patient Name: Alexis James Procedure Date: 10/27/2021 8:46 AM MRN: [id number] Account #: [id number] Date of Birth: [birth year] Admit Type: Outpatient Age: 70 Room: [facility] ENDO ROOM 4 Gender: Female Note Status: Finalized Instrument Name: Park Meo [id number] Procedure:             Colonoscopy Indications:           Positive Cologuard test Providers:             [name] MD, MD Referring MD:          Deborra Medina, MD (Referring MD) Medicines:             Propofol per Anesthesia Complications:         No immediate complications. Procedure:             Pre-Anesthesia Assessment:                        - Prior to the procedure, a History and Physical was                         performed, and patient medications and allergies were                         reviewed. The patient's tolerance of previous                         anesthesia was also reviewed. The risks and benefits                         of the procedure and the sedation options and risks                         were discussed with the patient. All questions were                         answered, and informed consent was obtained. Prior                         Anticoagulants: The patient has taken no previous                         anticoagulant or antiplatelet agents. ASA Grade                         Assessment: II - A patient with mild systemic disease.                         After reviewing the risks and benefits, the patient                         was deemed in satisfactory condition to undergo the                         procedure.                        After obtaining informed consent, the colonoscope was  passed under direct vision. Throughout the procedure,                         the patient's blood pressure, pulse, and oxygen                         saturations were monitored continuously. The                         Colonoscope was introduced  through the anus and                         advanced to the the cecum, identified by appendiceal                         orifice and ileocecal valve. The colonoscopy was                         performed without difficulty. The patient tolerated                         the procedure well. The quality of the bowel                         preparation was excellent. Findings:      The perianal and digital rectal examinations were normal.      A 7 mm polyp was found in the transverse colon. The polyp was       pedunculated. The polyp was removed with a cold snare. Resection and       retrieval were complete.      Multiple small-mouthed diverticula were found in the entire colon.      Non-bleeding internal hemorrhoids were found during retroflexion. The       hemorrhoids were Grade I (internal hemorrhoids that do not prolapse). Impression:            - One 7 mm polyp in the transverse colon, removed with                         a cold snare. Resected and retrieved.                        - Diverticulosis in the entire examined colon.                        - Non-bleeding internal hemorrhoids. Recommendation:        - Discharge patient to home.                        - Resume previous diet.                        - Continue present medications.                        - Await pathology results.                        - Repeat colonoscopy is not recommended for  surveillance. Procedure Code(s):     --- Professional ---                        [phone], Colonoscopy, flexible; with removal of                         tumor(s), polyp(s), or other lesion(s) by snare                         technique Diagnosis Code(s):     --- Professional ---                        R19.5, Other fecal abnormalities                        K63.5, Polyp of colon CPT copyright 2019 [employer]. All rights reserved. The codes documented in this report are preliminary and upon coder  review may  be revised to meet current compliance requirements. [name] MD, MD 10/27/2021 9:06:25 AM This report has been signed electronically. Number of Addenda: 0 Note Initiated On: 10/27/2021 8:46 AM Scope Withdrawal Time: 0 hours 6 minutes 5 seconds  Total Procedure Duration: 0 hours 11 minutes 51 seconds  Estimated Blood Loss:  Estimated blood loss: none.      [facility]

## 2021-10-27 NOTE — H&P (Signed)
Alexis Lame, MD [address] [place], [place] [address] [phone] Fax : [phone]  Primary Care Physician:  Crecencio Mc, MD Primary Gastroenterologist:  Dr. Allen Norris  Pre-Procedure History & Physical: HPI:  Alexis James is a 70 y.o. female is here for an colonoscopy.   Past Medical History:  Diagnosis Date   Anxiety    Cancer ([place])    skin   Diabetes mellitus without complication ([place])    Hyperlipidemia    Orthostatic hypotension 07/10/2019   Syncope and collapse 05/02/2019    Past Surgical History:  Procedure Laterality Date   BREAST SURGERY     breast reduction   CATARACT EXTRACTION W/PHACO Right 10/18/2017   Procedure: CATARACT EXTRACTION PHACO AND INTRAOCULAR LENS PLACEMENT ([place]);  Surgeon: Birder Robson, MD;  Location: [facility] ORS;  Service: Ophthalmology;  Laterality: Right;  [place] 00:27 AP% 19.7 CDE5.32 fluid pack lot # [id number]   CATARACT EXTRACTION W/PHACO Left 11/21/2018   Procedure: CATARACT EXTRACTION PHACO AND INTRAOCULAR LENS PLACEMENT ([facility]);  Surgeon: Birder Robson, MD;  Location: [facility] ORS;  Service: Ophthalmology;  Laterality: Left;  [place] 00:31.3 CDE 5.38 Fluid Pack Lot # [id number]   CESAREAN SECTION     NOSE SURGERY     REDUCTION MAMMAPLASTY     smr      Prior to Admission medications   Medication Sig Start Date End Date Taking? Authorizing Provider  escitalopram (LEXAPRO) 10 MG tablet Take 1 tablet by mouth once daily 09/07/21  Yes Crecencio Mc, MD  lovastatin (MEVACOR) 40 MG tablet Take 1 tablet (40 mg total) by mouth at bedtime. 03/18/21  Yes Minna Merritts, MD  Calcium Carbonate-Vitamin D3 600-400 MG-UNIT TABS Take 1 tablet by mouth daily.    [provider]  cholecalciferol (VITAMIN D) 25 MCG (1000 UNIT) tablet Take 1,000 Units by mouth daily.    [provider]  furosemide (LASIX) 20 MG tablet Take 1 tablet (20 mg total) by mouth daily. As needed for fluid retention 08/05/21   Crecencio Mc, MD     Allergies as of 09/08/2021   (No Known Allergies)    Family History  Problem Relation Age of Onset   Colitis Mother 88   Mental retardation Father 78   Alzheimer's disease Father    Heart Problems Brother    Transient ischemic attack Sister    Cancer Neg Hx    Breast cancer Neg Hx     Social History   Socioeconomic History   Marital status: Married    Spouse name: Not on file   Number of children: 3   Years of education: Not on file   Highest education level: Not on file  Occupational History   Not on file  Tobacco Use   Smoking status: Never    Passive exposure: Yes   Smokeless tobacco: Never   Tobacco comments:    Husband smoked x 38 years (quit 4 years ago)  Vaping Use   Vaping Use: Never used  Substance and Sexual Activity   Alcohol use: Yes    Comment: Occasional   Drug use: No   Sexual activity: Not on file  Other Topics Concern   Not on file  Social History Narrative   [place] with husband; never smoked; ocassional wine; 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    Social Determinants of Health   Financial Resource Strain: Low Risk    Difficulty of Paying Living Expenses: Not hard at all  Food Insecurity: No [place]  Worried About Charity fundraiser in the Last Year: Never true   [place] in the Last Year: Never true  Transportation Needs: No Transportation Needs   Lack of Transportation (Medical): No   Lack of Transportation (Non-Medical): No  Physical Activity: Sufficiently Active   Days of Exercise per Week: 5 days   Minutes of Exercise per Session: 30 min  Stress: No Stress Concern Present   Feeling of Stress : Not at all  Social Connections: Unknown   Frequency of Communication with Friends and Family: More than three times a week   Frequency of Social Gatherings with Friends and Family: More than three times a week   Attends Religious Services: Not on Electrical engineer or Organizations: Not on file   Attends English as a second language teacher Meetings: Not on file   Marital Status: Not on file  Intimate Partner Violence: Not At Risk   Fear of Current or Ex-Partner: No   Emotionally Abused: No   Physically Abused: No   Sexually Abused: No    Review of Systems: See HPI, otherwise negative ROS  Physical Exam: BP 107/62   Pulse 69   Temp (!) 96.8 F (36 C) (Temporal)   Resp 17   Ht 5' 2.5" (1.588 m)   Wt 58.5 kg   SpO2 98%   BMI 23.22 kg/m  General:   Alert,  pleasant and cooperative in NAD Head:  Normocephalic and atraumatic. Neck:  Supple; no masses or thyromegaly. Lungs:  Clear throughout to auscultation.    Heart:  Regular rate and rhythm. Abdomen:  Soft, nontender and nondistended. Normal bowel sounds, without guarding, and without rebound.   Neurologic:  Alert and  oriented x4;  grossly normal neurologically.  Impression/Plan: Alexis James is here for an colonoscopy to be performed for positive cologuard  Risks, benefits, limitations, and alternatives regarding  colonoscopy have been reviewed with the patient.  Questions have been answered.  All parties agreeable.   Alexis Lame, MD  10/27/2021, 8:42 AM

## 2021-10-27 NOTE — Transfer of Care (Signed)
Immediate Anesthesia Transfer of Care Note  Patient: Alexis James  Procedure(s) Performed: COLONOSCOPY WITH PROPOFOL  Patient Location: PACU  Anesthesia Type:General  Level of Consciousness: sedated  Airway & Oxygen Therapy: Patient Spontanous Breathing  Post-op Assessment: Report given to RN and Post -op Vital signs reviewed and stable  Post vital signs: Reviewed and stable  Last Vitals:  Vitals Value Taken Time  BP 122/63 10/27/21 0908  Temp    Pulse 71 10/27/21 0910  Resp 13 10/27/21 0910  SpO2 93 % 10/27/21 0910  Vitals shown include unvalidated device data.  Last Pain:  Vitals:   10/27/21 0900  TempSrc: Temporal  PainSc:          Complications: No notable events documented.

## 2021-10-28 ENCOUNTER — Encounter: Payer: Self-pay | Admitting: Gastroenterology

## 2021-10-28 LAB — SURGICAL PATHOLOGY

## 2021-10-29 ENCOUNTER — Other Ambulatory Visit: Payer: Self-pay

## 2021-10-29 ENCOUNTER — Encounter: Payer: Self-pay | Admitting: Gastroenterology

## 2021-10-29 ENCOUNTER — Ambulatory Visit (INDEPENDENT_AMBULATORY_CARE_PROVIDER_SITE_OTHER): Payer: PPO

## 2021-10-29 DIAGNOSIS — D126 Benign neoplasm of colon, unspecified: Secondary | ICD-10-CM

## 2021-10-29 DIAGNOSIS — E538 Deficiency of other specified B group vitamins: Secondary | ICD-10-CM | POA: Diagnosis not present

## 2021-10-29 MED ORDER — CYANOCOBALAMIN 1000 MCG/ML IJ SOLN
1000.0000 ug | Freq: Once | INTRAMUSCULAR | Status: AC
Start: 2021-10-29 — End: 2021-10-29
  Administered 2021-10-29: 09:00:00 1000 ug via INTRAMUSCULAR

## 2021-10-29 NOTE — Progress Notes (Signed)
Patient presented for B 12 injection to right deltoid, patient voiced no concerns nor showed any signs of distress during injection. 

## 2021-11-30 DIAGNOSIS — C44319 Basal cell carcinoma of skin of other parts of face: Secondary | ICD-10-CM | POA: Diagnosis not present

## 2021-12-01 ENCOUNTER — Ambulatory Visit (INDEPENDENT_AMBULATORY_CARE_PROVIDER_SITE_OTHER): Payer: PPO

## 2021-12-01 ENCOUNTER — Other Ambulatory Visit: Payer: Self-pay

## 2021-12-01 DIAGNOSIS — E538 Deficiency of other specified B group vitamins: Secondary | ICD-10-CM

## 2021-12-01 MED ORDER — CYANOCOBALAMIN 1000 MCG/ML IJ SOLN
1000.0000 ug | Freq: Once | INTRAMUSCULAR | Status: AC
  Administered 2021-12-01: 09:00:00 1000 ug via INTRAMUSCULAR

## 2021-12-01 NOTE — Progress Notes (Signed)
Patient presented for B 12 injection to left deltoid, patient voiced no concerns nor showed any signs of distress during injection. 

## 2021-12-03 ENCOUNTER — Other Ambulatory Visit: Payer: Self-pay | Admitting: Internal Medicine

## 2021-12-03 ENCOUNTER — Other Ambulatory Visit: Payer: Self-pay

## 2021-12-03 MED ORDER — LOVASTATIN 40 MG PO TABS: 40.0000 mg | ORAL_TABLET | Freq: Every day | ORAL | 3 refills | Status: DC

## 2022-01-01 ENCOUNTER — Other Ambulatory Visit: Payer: Self-pay

## 2022-01-01 ENCOUNTER — Ambulatory Visit: Payer: PPO

## 2022-01-01 ENCOUNTER — Ambulatory Visit (INDEPENDENT_AMBULATORY_CARE_PROVIDER_SITE_OTHER): Payer: PPO

## 2022-01-01 DIAGNOSIS — E538 Deficiency of other specified B group vitamins: Secondary | ICD-10-CM

## 2022-01-01 MED ORDER — CYANOCOBALAMIN 1000 MCG/ML IJ SOLN
1000.0000 ug | Freq: Once | INTRAMUSCULAR | Status: AC
  Administered 2022-01-01: 1000 ug via INTRAMUSCULAR

## 2022-01-01 NOTE — Progress Notes (Signed)
Patient came in today for B-12 injection given in right deltoid IM. Patient tolerated well with no signs of distress.  °

## 2022-01-04 ENCOUNTER — Ambulatory Visit: Payer: PPO

## 2022-02-01 ENCOUNTER — Ambulatory Visit (INDEPENDENT_AMBULATORY_CARE_PROVIDER_SITE_OTHER): Payer: PPO | Admitting: *Deleted

## 2022-02-01 ENCOUNTER — Other Ambulatory Visit: Payer: Self-pay

## 2022-02-01 DIAGNOSIS — E538 Deficiency of other specified B group vitamins: Secondary | ICD-10-CM | POA: Diagnosis not present

## 2022-02-01 MED ORDER — CYANOCOBALAMIN 1000 MCG/ML IJ SOLN
1000.0000 ug | Freq: Once | INTRAMUSCULAR | Status: AC
  Administered 2022-02-01: 1000 ug via INTRAMUSCULAR

## 2022-02-01 NOTE — Progress Notes (Addendum)
Pt arrived for B12 injection, given in L deltoid. Pt tolerated injection well, showed no signs of distress nor voiced any concerns.  ?

## 2022-02-11 ENCOUNTER — Telehealth: Payer: Self-pay | Admitting: Internal Medicine

## 2022-02-11 DIAGNOSIS — R5383 Other fatigue: Secondary | ICD-10-CM

## 2022-02-11 DIAGNOSIS — R7303 Prediabetes: Secondary | ICD-10-CM

## 2022-02-11 DIAGNOSIS — E782 Mixed hyperlipidemia: Secondary | ICD-10-CM

## 2022-02-11 NOTE — Telephone Encounter (Signed)
Pt stated that it says in my chart she need to have blood work checked every six months. Pt want to know if she can do all the blood work before her appointment with provider. Pt said if so let her know In my chart ?

## 2022-02-15 NOTE — Telephone Encounter (Signed)
Pt is coming in tomorrow for fasting blood work before her appt on Thursday. I have ordered TSH, CBC, Microalbumin, lipid panel, CMP and A1c. Is there anything else that needs to be ordered?  ?

## 2022-02-16 ENCOUNTER — Other Ambulatory Visit: Payer: Self-pay

## 2022-02-16 ENCOUNTER — Other Ambulatory Visit (INDEPENDENT_AMBULATORY_CARE_PROVIDER_SITE_OTHER): Payer: PPO

## 2022-02-16 DIAGNOSIS — R5383 Other fatigue: Secondary | ICD-10-CM

## 2022-02-16 DIAGNOSIS — E782 Mixed hyperlipidemia: Secondary | ICD-10-CM | POA: Diagnosis not present

## 2022-02-16 DIAGNOSIS — R7303 Prediabetes: Secondary | ICD-10-CM

## 2022-02-16 LAB — LIPID PANEL
Cholesterol: 188 mg/dL (ref 0–200)
HDL: 71.7 mg/dL (ref >39.00)
LDL Cholesterol: 97 mg/dL (ref 0–99)
NonHDL: 116.18
Total CHOL/HDL Ratio: 3
Triglycerides: 98 mg/dL (ref 0.0–149.0)
VLDL: 19.6 mg/dL (ref 0.0–40.0)

## 2022-02-16 LAB — COMPREHENSIVE METABOLIC PANEL
ALT: 16 U/L (ref 0–35)
AST: 22 U/L (ref 0–37)
Albumin: 4.1 g/dL (ref 3.5–5.2)
Alkaline Phosphatase: 63 U/L (ref 39–117)
BUN: 19 mg/dL (ref 6–23)
CO2: 30 mEq/L (ref 19–32)
Calcium: 9 mg/dL (ref 8.4–10.5)
Chloride: 103 mEq/L (ref 96–112)
Creatinine, Ser: 0.79 mg/dL (ref 0.40–1.20)
GFR: 75.08 mL/min (ref >60.00)
Glucose, Bld: 90 mg/dL (ref 70–99)
Potassium: 4.3 mEq/L (ref 3.5–5.1)
Sodium: 139 mEq/L (ref 135–145)
Total Bilirubin: 0.5 mg/dL (ref 0.2–1.2)
Total Protein: 6.6 g/dL (ref 6.0–8.3)

## 2022-02-16 LAB — CBC WITH DIFFERENTIAL/PLATELET
Basophils Absolute: 0 10*3/uL (ref 0.0–0.1)
Basophils Relative: 1.1 % (ref 0.0–3.0)
Eosinophils Absolute: 0.1 10*3/uL (ref 0.0–0.7)
Eosinophils Relative: 2.8 % (ref 0.0–5.0)
HCT: 37.9 % (ref 36.0–46.0)
Hemoglobin: 12.9 g/dL (ref 12.0–15.0)
Lymphocytes Relative: 27 % (ref 12.0–46.0)
Lymphs Abs: 0.9 10*3/uL (ref 0.7–4.0)
MCHC: 34.1 g/dL (ref 30.0–36.0)
MCV: 95.4 fl (ref 78.0–100.0)
Monocytes Absolute: 0.3 10*3/uL (ref 0.1–1.0)
Monocytes Relative: 8.2 % (ref 3.0–12.0)
Neutro Abs: 1.9 10*3/uL (ref 1.4–7.7)
Neutrophils Relative %: 60.9 % (ref 43.0–77.0)
Platelets: 274 10*3/uL (ref 150.0–400.0)
RBC: 3.97 Mil/uL (ref 3.87–5.11)
RDW: 13.8 % (ref 11.5–15.5)
WBC: 3.2 10*3/uL — ABNORMAL LOW (ref 4.0–10.5)

## 2022-02-16 LAB — LDL CHOLESTEROL, DIRECT: Direct LDL: 97 mg/dL

## 2022-02-16 LAB — TSH: TSH: 4.4 u[IU]/mL (ref 0.35–5.50)

## 2022-02-16 LAB — HEMOGLOBIN A1C: Hgb A1c MFr Bld: 6.2 % (ref 4.6–6.5)

## 2022-02-17 ENCOUNTER — Encounter: Payer: Self-pay | Admitting: Internal Medicine

## 2022-02-18 ENCOUNTER — Other Ambulatory Visit: Payer: Self-pay

## 2022-02-18 ENCOUNTER — Ambulatory Visit (INDEPENDENT_AMBULATORY_CARE_PROVIDER_SITE_OTHER): Payer: PPO | Admitting: Internal Medicine

## 2022-02-18 VITALS — BP 130/68 | HR 84 | Temp 98.4°F | Wt 132.0 lb

## 2022-02-18 DIAGNOSIS — R7303 Prediabetes: Secondary | ICD-10-CM | POA: Diagnosis not present

## 2022-02-18 DIAGNOSIS — R5383 Other fatigue: Secondary | ICD-10-CM

## 2022-02-18 DIAGNOSIS — E538 Deficiency of other specified B group vitamins: Secondary | ICD-10-CM | POA: Diagnosis not present

## 2022-02-18 DIAGNOSIS — E782 Mixed hyperlipidemia: Secondary | ICD-10-CM

## 2022-02-18 DIAGNOSIS — I700 Atherosclerosis of aorta: Secondary | ICD-10-CM | POA: Diagnosis not present

## 2022-02-18 DIAGNOSIS — J479 Bronchiectasis, uncomplicated: Secondary | ICD-10-CM | POA: Diagnosis not present

## 2022-02-18 DIAGNOSIS — F419 Anxiety disorder, unspecified: Secondary | ICD-10-CM

## 2022-02-18 DIAGNOSIS — D709 Neutropenia, unspecified: Secondary | ICD-10-CM | POA: Diagnosis not present

## 2022-02-18 DIAGNOSIS — L821 Other seborrheic keratosis: Secondary | ICD-10-CM | POA: Diagnosis not present

## 2022-02-18 DIAGNOSIS — Z8669 Personal history of other diseases of the nervous system and sense organs: Secondary | ICD-10-CM | POA: Diagnosis not present

## 2022-02-18 DIAGNOSIS — R351 Nocturia: Secondary | ICD-10-CM | POA: Diagnosis not present

## 2022-02-18 DIAGNOSIS — Z1231 Encounter for screening mammogram for malignant neoplasm of breast: Secondary | ICD-10-CM

## 2022-02-18 MED ORDER — ROSUVASTATIN CALCIUM 10 MG PO TABS: 10.0000 mg | ORAL_TABLET | Freq: Every day | ORAL | 3 refills | Status: DC

## 2022-02-18 MED ORDER — SOLIFENACIN SUCCINATE 5 MG PO TABS: 5.0000 mg | ORAL_TABLET | Freq: Every day | ORAL | 1 refills | Status: DC

## 2022-02-18 NOTE — Assessment & Plan Note (Signed)
Small buildup in right ear only..  rec debrox  ?

## 2022-02-18 NOTE — Assessment & Plan Note (Signed)
She has no purulent sputum or productive cough currently ?

## 2022-02-18 NOTE — Assessment & Plan Note (Signed)
Changing statin to Crestor 10 mg for goal LDL 70 ?

## 2022-02-18 NOTE — Progress Notes (Addendum)
? ?Subjective:  ?Patient ID: Alexis James, female    DOB: 1951/03/20  Age: 71 y.o. MRN: [id number] ? ?CC: The primary encounter diagnosis was Encounter for screening mammogram for malignant neoplasm of breast. Diagnoses of Mixed hyperlipidemia, Prediabetes, Other fatigue, B12 deficiency, Hx of impacted cerumen, Aortic atherosclerosis ([place]), Nocturia, Bronchiectasis without complication ([place]), Neutropenia, unspecified type ([place]), Anxiety, and Seborrheic keratosis of scalp were also pertinent to this visit. ? ? ?This visit occurred during the SARS-CoV-2 public health emergency.  Safety protocols were in place, including screening questions prior to the visit, additional usage of staff PPE, and extensive cleaning of exam room while observing appropriate contact time as indicated for disinfecting solutions.   ? ?HPI ?Alexis James presents for  ?Chief Complaint  ?Patient presents with  ? Follow-up  ?  6 month follow up   ? ? ?1) Aortic atherosclerosis  Reviewed findings of prior CT scan today..  Patient is tolerating medium  potency statin therapy   ? ?2) nocturia x 3-4  interrupting sleep.  Doesn't occur during the day  .  Reviewed diet,  including caffeine and water intake ? ?3)  large SK on occipital scalp , right side .  Has been removed once by Dr Kellie Moor but has grown back . Large enough to become irritated by brushing hair,  currint hair.  ? ?Outpatient Medications Prior to Visit  ?Medication Sig Dispense Refill  ? Calcium Carbonate-Vitamin D3 600-400 MG-UNIT TABS Take 1 tablet by mouth daily.    ? cholecalciferol (VITAMIN D) 25 MCG (1000 UNIT) tablet Take 1,000 Units by mouth daily.    ? escitalopram (LEXAPRO) 10 MG tablet Take 1 tablet by mouth once daily 90 tablet 0  ? lovastatin (MEVACOR) 40 MG tablet Take 1 tablet (40 mg total) by mouth at bedtime. 90 tablet 3  ? furosemide (LASIX) 20 MG tablet Take 1 tablet (20 mg total) by mouth daily. As needed for fluid retention (Patient not taking: Reported on  02/17/2022) 15 tablet 1  ? ?No facility-administered medications prior to visit.  ? ? ?Review of Systems; ? ?Patient denies headache, fevers, malaise, unintentional weight loss, skin rash, eye pain, sinus congestion and sinus pain, sore throat, dysphagia,  hemoptysis , cough, dyspnea, wheezing, chest pain, palpitations, orthopnea, edema, abdominal pain, nausea, melena, diarrhea, constipation, flank pain, dysuria, hematuria, urinary  Frequency, nocturia, numbness, tingling, seizures,  Focal weakness, Loss of consciousness,  Tremor, insomnia, depression, anxiety, and suicidal ideation.   ? ? ? ?Objective:  ?There were no vitals taken for this visit. ? ?BP Readings from Last 3 Encounters:  ?10/27/21 129/67  ?08/05/21 (!) 110/58  ?05/18/21 106/63  ? ? ?Wt Readings from Last 3 Encounters:  ?10/27/21 129 lb (58.5 kg)  ?08/05/21 133 lb 9.6 oz (60.6 kg)  ?05/18/21 133 lb (60.3 kg)  ? ? ?General appearance: alert, cooperative and appears stated age ?Scalp:  quarter sized papular lesion on occipital scalp consistent with SK ? ?Ears: normal TM's and external ear canals both ears ?Throat: lips, mucosa, and tongue normal; teeth and gums normal ?Neck: no adenopathy, no carotid bruit, supple, symmetrical, trachea midline and thyroid not enlarged, symmetric, no tenderness/mass/nodules ?Back: symmetric, no curvature. ROM normal. No CVA tenderness. ?Lungs: clear to auscultation bilaterally ?Heart: regular rate and rhythm, S1, S2 normal, no murmur, click, rub or gallop ?Abdomen: soft, non-tender; bowel sounds normal; no masses,  no organomegaly ?Pulses: 2+ and symmetric ?Skin: Skin color, texture, turgor normal. No rashes or lesions ?Lymph nodes: Cervical, supraclavicular,  and axillary nodes normal. ? ?Lab Results  ?Component Value Date  ? HGBA1C 6.2 02/16/2022  ? HGBA1C 6.2 08/05/2021  ? HGBA1C 6.1 02/04/2021  ? ? ?Lab Results  ?Component Value Date  ? CREATININE 0.79 02/16/2022  ? CREATININE 0.78 08/05/2021  ? CREATININE 0.81  02/04/2021  ? ? ?Lab Results  ?Component Value Date  ? WBC 3.2 (L) 02/16/2022  ? HGB 12.9 02/16/2022  ? HCT 37.9 02/16/2022  ? PLT 274.0 02/16/2022  ? GLUCOSE 90 02/16/2022  ? CHOL 188 02/16/2022  ? TRIG 98.0 02/16/2022  ? HDL 71.70 02/16/2022  ? LDLDIRECT 97.0 02/16/2022  ? [place] 97 02/16/2022  ? ALT 16 02/16/2022  ? AST 22 02/16/2022  ? NA 139 02/16/2022  ? K 4.3 02/16/2022  ? CL 103 02/16/2022  ? CREATININE 0.79 02/16/2022  ? BUN 19 02/16/2022  ? CO2 30 02/16/2022  ? TSH 4.40 02/16/2022  ? HGBA1C 6.2 02/16/2022  ? ? ?No results found. ? ?Assessment & Plan:  ? ?Problem List Items Addressed This Visit   ? ? Seborrheic keratosis of scalp  ?  Quite large,  And irritated  .  Reviewed natural history of SKw with patient.  She is aware that surgical removal will likely result in a permanent loss of hair follicles  ?  ?  ? Prediabetes  ?  Her  random glucose is not  elevated but her A1c continues to suggest she is at increased  risk for developing diabetes.  I recommend she follow a low glycemic index diet and participate regularly in an aerobic  exercise activity.  We should check an A1c in 6 months.  ? ? ?Lab Results  ?Component Value Date  ? HGBA1C 6.2 02/16/2022  ? ? ?  ?  ? Relevant Orders  ? Comprehensive metabolic panel  ? Nocturia  ?  Chronic 3-4 times per night.  Trial of vesicare.  Dietary restriction discussed ? ?  ?  ? Neutropenia ([place])  ?  No recurrent infections.  Discussed.  Will follow  ?  ?  ? Hyperlipidemia  ?  Managed with lovastatin.  willig to  Switch to high potency statin given presence of AA on CT scan  ? ?Lab Results  ?Component Value Date  ? CHOL 188 02/16/2022  ? HDL 71.70 02/16/2022  ? [place] 97 02/16/2022  ? LDLDIRECT 97.0 02/16/2022  ? TRIG 98.0 02/16/2022  ? CHOLHDL 3 02/16/2022  ? ?Lab Results  ?Component Value Date  ? ALT 16 02/16/2022  ? AST 22 02/16/2022  ? ALKPHOS 63 02/16/2022  ? BILITOT 0.5 02/16/2022  ? ? ?  ?  ? Relevant Medications  ? rosuvastatin (CRESTOR) 10 MG tablet  ?  Other Relevant Orders  ? Lipid panel  ? LDL cholesterol, direct  ? Hx of impacted cerumen  ?  Small buildup in right ear only..  rec debrox  ?  ?  ? Bronchiectasis ([place])  ?  She has no purulent sputum or productive cough currently ?  ?  ? B12 deficiency  ?  She would like to change to oral.  rec 2500 mcg daily and recheck b12 level in 3 months  ?  ?  ? Relevant Orders  ? Vitamin B12  ? Aortic atherosclerosis ([place])  ?  Changing statin to Crestor 10 mg for goal LDL 70 ?  ?  ? Relevant Medications  ? rosuvastatin (CRESTOR) 10 MG tablet  ? Anxiety  ?  Continue lexapro 10 mg daily  For mitigation of hot flashes and management of anxiety  ?  ?  ? ?Other Visit Diagnoses   ? ? Encounter for screening mammogram for malignant neoplasm of breast    -  Primary  ? Relevant Orders  ? MM 3D SCREEN BREAST BILATERAL  ? Other fatigue      ? Relevant Orders  ? CBC with Differential/Platelet  ? ?  ? ? ?I spent 30 minutes dedicated to the care of this patient on the date of this encounter to include pre-visit review of patient's medical history,  most recent imaging studies, Face-to-face time with the patient , and post visit ordering of testing and therapeutics.   ? ?Follow-up: Return in about 3 months (around 05/21/2022). ? ? ?Crecencio Mc, MD ?

## 2022-02-18 NOTE — Assessment & Plan Note (Addendum)
She would like to change to oral.  rec 2500 mcg daily and recheck b12 level in 3 months  ?

## 2022-02-18 NOTE — Assessment & Plan Note (Signed)
Chronic 3-4 times per night.  Trial of vesicare.  Dietary restriction discussed ? ?

## 2022-02-18 NOTE — Assessment & Plan Note (Signed)
No recurrent infections.  Discussed.  Will follow  ?

## 2022-02-18 NOTE — Patient Instructions (Signed)
Ok to stop b12 shots and start B12 supplements 2500 mcg or more daily ? ?We will recheck your level in 3 months ? ?We are changing your statin from lovastatin  to rosuvastatin because of your aortic atherosclerosis ? ?For the nighttime urinary frequency ? ?Adding vesicare in the evening for overactive bladder  ? ?Limit caffeine to morning only ? ?Increase your  morning and afternoon water intake and decrease evening intake  ? ?To prevent dementia : ? ?Alexis James new tasks of  computer and practice moderation in all things  ? ?"Luminosity" is a Building services engineer that makes your brain work  ? ? A low white count in the absence of frequent infections is not treatable or serious   we will contineu to monitor for now ? ?

## 2022-02-19 NOTE — Addendum Note (Signed)
Addended by: Leeanne Rio on: 02/19/2022 02:14 PM ? ? Modules accepted: Orders ? ?

## 2022-02-20 ENCOUNTER — Encounter: Payer: Self-pay | Admitting: Internal Medicine

## 2022-02-20 DIAGNOSIS — L821 Other seborrheic keratosis: Secondary | ICD-10-CM | POA: Insufficient documentation

## 2022-02-20 NOTE — Assessment & Plan Note (Signed)
Her  random glucose is not  elevated but her A1c continues to suggest she is at increased  risk for developing diabetes.  I recommend she follow a low glycemic index diet and participate regularly in an aerobic  exercise activity.  We should check an A1c in 6 months.  ? ? ?Lab Results  ?Component Value Date  ? HGBA1C 6.2 02/16/2022  ? ? ?

## 2022-02-20 NOTE — Assessment & Plan Note (Signed)
Quite large,  And irritated  .  Reviewed natural history of SKw with patient.  She is aware that surgical removal will likely result in a permanent loss of hair follicles  ?

## 2022-02-20 NOTE — Assessment & Plan Note (Signed)
Continue lexapro 10 mg daily  For mitigation of hot flashes and management of anxiety  ?

## 2022-02-20 NOTE — Assessment & Plan Note (Signed)
Managed with lovastatin.  willig to  Switch to high potency statin given presence of AA on CT scan  ? ?Lab Results  ?Component Value Date  ? CHOL 188 02/16/2022  ? HDL 71.70 02/16/2022  ? [place] 97 02/16/2022  ? LDLDIRECT 97.0 02/16/2022  ? TRIG 98.0 02/16/2022  ? CHOLHDL 3 02/16/2022  ? ?Lab Results  ?Component Value Date  ? ALT 16 02/16/2022  ? AST 22 02/16/2022  ? ALKPHOS 63 02/16/2022  ? BILITOT 0.5 02/16/2022  ? ? ?

## 2022-03-02 ENCOUNTER — Ambulatory Visit: Payer: PPO

## 2022-03-03 ENCOUNTER — Other Ambulatory Visit: Payer: Self-pay | Admitting: Internal Medicine

## 2022-03-15 ENCOUNTER — Telehealth: Payer: Self-pay | Admitting: Internal Medicine

## 2022-03-15 MED ORDER — SOLIFENACIN SUCCINATE 5 MG PO TABS: 5.0000 mg | ORAL_TABLET | Freq: Every day | ORAL | 1 refills | Status: DC

## 2022-03-15 NOTE — Telephone Encounter (Signed)
Patient needs a refill on the following: solifenacin (VESICARE) 5 MG tablet, patient would like a 90 supply,  ?

## 2022-03-15 NOTE — Telephone Encounter (Signed)
90 day supply has been sent in 

## 2022-03-15 NOTE — Addendum Note (Signed)
Addended by: Adair Laundry on: 03/15/2022 05:17 PM ? ? Modules accepted: Orders ? ?

## 2022-03-24 DIAGNOSIS — L820 Inflamed seborrheic keratosis: Secondary | ICD-10-CM | POA: Diagnosis not present

## 2022-03-24 DIAGNOSIS — L538 Other specified erythematous conditions: Secondary | ICD-10-CM | POA: Diagnosis not present

## 2022-05-18 ENCOUNTER — Telehealth: Payer: Self-pay | Admitting: Cardiovascular Disease

## 2022-05-18 NOTE — Telephone Encounter (Signed)
3 attempts to schedule fu appt from recall list.   Deleting recall.   

## 2022-05-20 ENCOUNTER — Ambulatory Visit: Payer: PPO

## 2022-05-21 ENCOUNTER — Other Ambulatory Visit (INDEPENDENT_AMBULATORY_CARE_PROVIDER_SITE_OTHER): Payer: PPO

## 2022-05-21 ENCOUNTER — Other Ambulatory Visit: Payer: Self-pay | Admitting: Lab

## 2022-05-21 DIAGNOSIS — R5383 Other fatigue: Secondary | ICD-10-CM

## 2022-05-21 DIAGNOSIS — E782 Mixed hyperlipidemia: Secondary | ICD-10-CM

## 2022-05-21 DIAGNOSIS — E538 Deficiency of other specified B group vitamins: Secondary | ICD-10-CM

## 2022-05-21 DIAGNOSIS — R7303 Prediabetes: Secondary | ICD-10-CM | POA: Diagnosis not present

## 2022-05-21 LAB — CBC WITH DIFFERENTIAL/PLATELET
Basophils Absolute: 0 10*3/uL (ref 0.0–0.1)
Basophils Relative: 0.9 % (ref 0.0–3.0)
Eosinophils Absolute: 0.1 10*3/uL (ref 0.0–0.7)
Eosinophils Relative: 3.4 % (ref 0.0–5.0)
HCT: 38 % (ref 36.0–46.0)
Hemoglobin: 12.9 g/dL (ref 12.0–15.0)
Lymphocytes Relative: 30.1 % (ref 12.0–46.0)
Lymphs Abs: 1 10*3/uL (ref 0.7–4.0)
MCHC: 33.9 g/dL (ref 30.0–36.0)
MCV: 95.4 fl (ref 78.0–100.0)
Monocytes Absolute: 0.3 10*3/uL (ref 0.1–1.0)
Monocytes Relative: 8 % (ref 3.0–12.0)
Neutro Abs: 1.9 10*3/uL (ref 1.4–7.7)
Neutrophils Relative %: 57.6 % (ref 43.0–77.0)
Platelets: 272 10*3/uL (ref 150.0–400.0)
RBC: 3.98 Mil/uL (ref 3.87–5.11)
RDW: 13.7 % (ref 11.5–15.5)
WBC: 3.3 10*3/uL — ABNORMAL LOW (ref 4.0–10.5)

## 2022-05-21 LAB — COMPREHENSIVE METABOLIC PANEL
ALT: 16 U/L (ref 0–35)
AST: 22 U/L (ref 0–37)
Albumin: 4 g/dL (ref 3.5–5.2)
Alkaline Phosphatase: 56 U/L (ref 39–117)
BUN: 22 mg/dL (ref 6–23)
CO2: 29 mEq/L (ref 19–32)
Calcium: 9.2 mg/dL (ref 8.4–10.5)
Chloride: 104 mEq/L (ref 96–112)
Creatinine, Ser: 0.79 mg/dL (ref 0.40–1.20)
GFR: 74.95 mL/min (ref >60.00)
Glucose, Bld: 84 mg/dL (ref 70–99)
Potassium: 4.1 mEq/L (ref 3.5–5.1)
Sodium: 140 mEq/L (ref 135–145)
Total Bilirubin: 0.5 mg/dL (ref 0.2–1.2)
Total Protein: 6.2 g/dL (ref 6.0–8.3)

## 2022-05-21 LAB — LIPID PANEL
Cholesterol: 180 mg/dL (ref 0–200)
HDL: 63.5 mg/dL (ref >39.00)
LDL Cholesterol: 94 mg/dL (ref 0–99)
NonHDL: 116.11
Total CHOL/HDL Ratio: 3
Triglycerides: 109 mg/dL (ref 0.0–149.0)
VLDL: 21.8 mg/dL (ref 0.0–40.0)

## 2022-05-21 LAB — MICROALBUMIN / CREATININE URINE RATIO
Creatinine,U: 60.3 mg/dL
Microalb Creat Ratio: 1.2 mg/g (ref 0.0–30.0)
Microalb, Ur: <0.7 mg/dL (ref 0.0–1.9)

## 2022-05-21 LAB — LDL CHOLESTEROL, DIRECT: Direct LDL: 92 mg/dL

## 2022-05-21 LAB — VITAMIN B12: Vitamin B-12: >1504 pg/mL — ABNORMAL HIGH (ref 211–911)

## 2022-05-21 NOTE — Addendum Note (Signed)
Addended by: Leeanne Rio on: 05/21/2022 08:23 AM   Modules accepted: Orders

## 2022-05-24 ENCOUNTER — Encounter: Payer: Self-pay | Admitting: Internal Medicine

## 2022-08-06 ENCOUNTER — Telehealth: Payer: Self-pay | Admitting: Family

## 2022-08-09 ENCOUNTER — Ambulatory Visit (INDEPENDENT_AMBULATORY_CARE_PROVIDER_SITE_OTHER): Payer: PPO

## 2022-08-09 VITALS — Ht 62.5 in | Wt 132.0 lb

## 2022-08-09 DIAGNOSIS — Z0000 Encounter for general adult medical examination without abnormal findings: Secondary | ICD-10-CM | POA: Diagnosis not present

## 2022-08-09 NOTE — Patient Instructions (Addendum)
Alexis James , Thank you for taking time to come for your Medicare Wellness Visit. I appreciate your ongoing commitment to your health goals. Please review the following plan we discussed and let me know if I can assist you in the future.   These are the goals we discussed:  Goals       Patient Stated     I need to walk more (pt-stated)      Walk for exercise       Other     DIET - INCREASE LEAN PROTEINS      Healthy diet Low carb foods        This is a list of the screening recommended for you and due dates:  Health Maintenance  Topic Date Due   COVID-19 Vaccine (4 - Pfizer risk series) 08/25/2022*   Flu Shot  03/13/2023*   Mammogram  08/27/2022   Cologuard (Stool DNA test)  08/18/2024   Tetanus Vaccine  04/16/2025   Pneumonia Vaccine  Completed   DEXA scan (bone density measurement)  Completed   Hepatitis C Screening: USPSTF Recommendation to screen - Ages 70-79 yo.  Completed   Zoster (Shingles) Vaccine  Completed   HPV Vaccine  Aged Out   Colon Cancer Screening  Discontinued  *Topic was postponed. The date shown is not the original due date.     Preventive Care 9 Years and Older, Female Preventive care refers to lifestyle choices and visits with your health care provider that can promote health and wellness. What does preventive care include? A yearly physical exam. This is also called an annual well check. Dental exams once or twice a year. Routine eye exams. Ask your health care provider how often you should have your eyes checked. Personal lifestyle choices, including: Daily care of your teeth and gums. Regular physical activity. Eating a healthy diet. Avoiding tobacco and drug use. Limiting alcohol use. Practicing safe sex. Taking low-dose aspirin every day. Taking vitamin and mineral supplements as recommended by your health care provider. What happens during an annual well check? The services and screenings done by your health care provider during your  annual well check will depend on your age, overall health, lifestyle risk factors, and family history of disease. Counseling  Your health care provider may ask you questions about your: Alcohol use. Tobacco use. Drug use. Emotional well-being. Home and relationship well-being. Sexual activity. Eating habits. History of falls. Memory and ability to understand (cognition). Work and work Statistician. Reproductive health. Screening  You may have the following tests or measurements: Height, weight, and BMI. Blood pressure. Lipid and cholesterol levels. These may be checked every 5 years, or more frequently if you are over 47 years old. Skin check. Lung cancer screening. You may have this screening every year starting at age 70 if you have a 30-pack-year history of smoking and currently smoke or have quit within the past 15 years. Fecal occult blood test (FOBT) of the stool. You may have this test every year starting at age 17. Flexible sigmoidoscopy or colonoscopy. You may have a sigmoidoscopy every 5 years or a colonoscopy every 10 years starting at age 64. Hepatitis C blood test. Hepatitis B blood test. Sexually transmitted disease (STD) testing. Diabetes screening. This is done by checking your blood sugar (glucose) after you have not eaten for a while (fasting). You may have this done every 1-3 years. Bone density scan. This is done to screen for osteoporosis. You may have this done starting at age  65. Mammogram. This may be done every 1-2 years. Talk to your health care provider about how often you should have regular mammograms. Talk with your health care provider about your test results, treatment options, and if necessary, the need for more tests. Vaccines  Your health care provider may recommend certain vaccines, such as: Influenza vaccine. This is recommended every year. Tetanus, diphtheria, and acellular pertussis (Tdap, Td) vaccine. You may need a Td booster every 10  years. Zoster vaccine. You may need this after age 79. Pneumococcal 13-valent conjugate (PCV13) vaccine. One dose is recommended after age 67. Pneumococcal polysaccharide (PPSV23) vaccine. One dose is recommended after age 73. Talk to your health care provider about which screenings and vaccines you need and how often you need them. This information is not intended to replace advice given to you by your health care provider. Make sure you discuss any questions you have with your health care provider. Document Released: 12/26/2015 Document Revised: 08/18/2016 Document Reviewed: 09/30/2015 Elsevier Interactive Patient Education  2017 [place] Prevention in the Home Falls can cause injuries. They can happen to people of all ages. There are many things you can do to make your home safe and to help prevent falls. What can I do on the outside of my home? Regularly fix the edges of walkways and driveways and fix any cracks. Remove anything that might make you trip as you walk through a door, such as a raised step or threshold. Trim any bushes or trees on the path to your home. Use bright outdoor lighting. Clear any walking paths of anything that might make someone trip, such as rocks or tools. Regularly check to see if handrails are loose or broken. Make sure that both sides of any steps have handrails. Any raised decks and porches should have guardrails on the edges. Have any leaves, snow, or ice cleared regularly. Use sand or salt on walking paths during winter. Clean up any spills in your garage right away. This includes oil or grease spills. What can I do in the bathroom? Use night lights. Install grab bars by the toilet and in the tub and shower. Do not use towel bars as grab bars. Use non-skid mats or decals in the tub or shower. If you need to sit down in the shower, use a plastic, non-slip stool. Keep the floor dry. Clean up any water that spills on the floor as soon as it  happens. Remove soap buildup in the tub or shower regularly. Attach bath mats securely with double-sided non-slip rug tape. Do not have throw rugs and other things on the floor that can make you trip. What can I do in the bedroom? Use night lights. Make sure that you have a light by your bed that is easy to reach. Do not use any sheets or blankets that are too big for your bed. They should not hang down onto the floor. Have a firm chair that has side arms. You can use this for support while you get dressed. Do not have throw rugs and other things on the floor that can make you trip. What can I do in the kitchen? Clean up any spills right away. Avoid walking on wet floors. Keep items that you use a lot in easy-to-reach places. If you need to reach something above you, use a strong step stool that has a grab bar. Keep electrical cords out of the way. Do not use floor polish or wax that makes floors slippery.  If you must use wax, use non-skid floor wax. Do not have throw rugs and other things on the floor that can make you trip. What can I do with my stairs? Do not leave any items on the stairs. Make sure that there are handrails on both sides of the stairs and use them. Fix handrails that are broken or loose. Make sure that handrails are as long as the stairways. Check any carpeting to make sure that it is firmly attached to the stairs. Fix any carpet that is loose or worn. Avoid having throw rugs at the top or bottom of the stairs. If you do have throw rugs, attach them to the floor with carpet tape. Make sure that you have a light switch at the top of the stairs and the bottom of the stairs. If you do not have them, ask someone to add them for you. What else can I do to help prevent falls? Wear shoes that: Do not have high heels. Have rubber bottoms. Are comfortable and fit you well. Are closed at the toe. Do not wear sandals. If you use a stepladder: Make sure that it is fully opened.  Do not climb a closed stepladder. Make sure that both sides of the stepladder are locked into place. Ask someone to hold it for you, if possible. Clearly mark and make sure that you can see: Any grab bars or handrails. First and last steps. Where the edge of each step is. Use tools that help you move around (mobility aids) if they are needed. These include: Canes. Walkers. Scooters. Crutches. Turn on the lights when you go into a dark area. Replace any light bulbs as soon as they burn out. Set up your furniture so you have a clear path. Avoid moving your furniture around. If any of your floors are uneven, fix them. If there are any pets around you, be aware of where they are. Review your medicines with your doctor. Some medicines can make you feel dizzy. This can increase your chance of falling. Ask your doctor what other things that you can do to help prevent falls. This information is not intended to replace advice given to you by your health care provider. Make sure you discuss any questions you have with your health care provider. Document Released: 09/25/2009 Document Revised: 05/06/2016 Document Reviewed: 01/03/2015 Elsevier Interactive Patient Education  2017 [employer].

## 2022-08-09 NOTE — Progress Notes (Addendum)
Subjective:   Alexis James is a 71 y.o. female who presents for Medicare Annual (Subsequent) preventive examination.  Review of Systems    No ROS.  Medicare Wellness Virtual Visit.  Visual/audio telehealth visit, UTA vital signs.   See social history for additional risk factors.    Cardiac Risk Factors include: advanced age (>71mn, >>77women)     Objective:    Today's Vitals   08/09/22 1037  Weight: 132 lb (59.9 kg)  Height: 5' 2.5" (1.588 m)   Body mass index is 23.76 kg/m.     08/09/2022   10:33 AM 10/27/2021    8:19 AM 05/18/2021    2:22 PM 11/19/2020    1:54 PM 03/28/2020    9:08 AM 08/03/2019   11:19 AM 07/03/2019    9:25 PM  Advanced Directives  Does Patient Have a Medical Advance Directive? No No No No No No No  Does patient want to make changes to medical advance directive? No - Patient declined        Would patient like information on creating a medical advance directive?  No - Patient declined No - Patient declined No - Patient declined Yes (MAU/Ambulatory/Procedural Areas - Information given) No - Patient declined No - Patient declined    Current Medications (verified) Outpatient Encounter Medications as of 08/09/2022  Medication Sig   Calcium Carbonate-Vitamin D3 600-400 MG-UNIT TABS Take 1 tablet by mouth daily.   cholecalciferol (VITAMIN D) 25 MCG (1000 UNIT) tablet Take 1,000 Units by mouth daily.   escitalopram (LEXAPRO) 10 MG tablet Take 1 tablet by mouth once daily   furosemide (LASIX) 20 MG tablet Take 1 tablet (20 mg total) by mouth daily. As needed for fluid retention (Patient not taking: Reported on 02/17/2022)   rosuvastatin (CRESTOR) 10 MG tablet Take 1 tablet (10 mg total) by mouth daily.   solifenacin (VESICARE) 5 MG tablet Take 1 tablet (5 mg total) by mouth daily.   No facility-administered encounter medications on file as of 08/09/2022.    Allergies (verified) Patient has no known allergies.   History: Past Medical History:  Diagnosis  Date   Anxiety    Cancer ([facility]    skin   Diabetes mellitus without complication (HBroadus    Hyperlipidemia    Orthostatic hypotension 07/10/2019   Syncope and collapse 05/02/2019   Past Surgical History:  Procedure Laterality Date   BREAST SURGERY     breast reduction   CATARACT EXTRACTION W/PHACO Right 10/18/2017   Procedure: CATARACT EXTRACTION PHACO AND INTRAOCULAR LENS PLACEMENT (IG[place];  Surgeon: PBirder Robson MD;  Location: [facility] ORS;  Service: Ophthalmology;  Laterality: Right;  [place]:27 AP% 19.7 CDE5.32 fluid pack lot # [id number]   CATARACT EXTRACTION W/PHACO Left 11/21/2018   Procedure: CATARACT EXTRACTION PHACO AND INTRAOCULAR LENS PLACEMENT (IOC);  Surgeon: PBirder Robson MD;  Location: [facility] ORS;  Service: Ophthalmology;  Laterality: Left;  [place]00:31.3 CDE 5.38 Fluid Pack Lot # [id number]   CESAREAN SECTION     COLONOSCOPY WITH PROPOFOL N/A 10/27/2021   Procedure: COLONOSCOPY WITH PROPOFOL;  Surgeon: WLucilla Lame MD;  [facility];  Service: Endoscopy;  Laterality: N/A;   NOSE SURGERY     REDUCTION MAMMAPLASTY     smr     Family History  Problem Relation Age of Onset   Colitis Mother 957  Mental retardation Father 731  Alzheimer's disease Father    Heart Problems Brother    Transient ischemic attack Sister  Cancer Neg Hx    Breast cancer Neg Hx    Social History   Socioeconomic History   Marital status: Married    Spouse name: Not on file   Number of children: 3   Years of education: Not on file   Highest education level: Not on file  Occupational History   Not on file  Tobacco Use   Smoking status: Never    Passive exposure: Yes   Smokeless tobacco: Never   Tobacco comments:    Husband smoked x 38 years (quit 4 years ago)  Vaping Use   Vaping Use: Never used  Substance and Sexual Activity   Alcohol use: Yes    Comment: Occasional   Drug use: No   Sexual activity: Not on file  Other Topics Concern   Not on file  Social History  Narrative   Normal with husband; never smoked; ocassional wine; 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    Social Determinants of Health   Financial Resource Strain: Low Risk  (08/09/2022)   Overall Financial Resource Strain (CARDIA)    Difficulty of Paying Living Expenses: Not hard at all  Food Insecurity: No [place] (08/09/2022)   Hunger Vital Sign    Worried About Running Out of Food in the Last Year: Never true    [place] in the Last Year: Never true  Transportation Needs: No Transportation Needs (08/09/2022)   PRAPARE - Hydrologist (Medical): No    Lack of Transportation (Non-Medical): No  Physical Activity: Sufficiently Active (05/18/2021)   Exercise Vital Sign    Days of Exercise per Week: 5 days    Minutes of Exercise per Session: 30 min  Stress: No Stress Concern Present (08/09/2022)   [place]    Feeling of Stress : Not at all  Social Connections: Unknown (08/09/2022)   Social Connection and Isolation Panel [NHANES]    Frequency of Communication with Friends and Family: More than three times a week    Frequency of Social Gatherings with Friends and Family: More than three times a week    Attends Religious Services: Not on Advertising copywriter or Organizations: Not on file    Attends Archivist Meetings: Not on file    Marital Status: Not on file    Tobacco Counseling Counseling given: Not Answered Tobacco comments: Husband smoked x 38 years (quit 4 years ago)   Clinical Intake:  Pre-visit preparation completed: Yes        Diabetes: No  How often do you need to have someone help you when you read instructions, pamphlets, or other written materials from your doctor or pharmacy?: 1 - Never    Interpreter Needed?: No      Activities of Daily Living    08/09/2022   11:04 AM  In your present state of health, do you have any difficulty  performing the following activities:  Hearing? 0  Vision? 0  Difficulty concentrating or making decisions? 0  Walking or climbing stairs? 0  Dressing or bathing? 0  Doing errands, shopping? 0  Preparing Food and eating ? N  Using the Toilet? N  In the past six months, have you accidently leaked urine? N  Do you have problems with loss of bowel control? N  Managing your Medications? N  Managing your Finances? N  Housekeeping or managing your Housekeeping? N    Patient Care  Team: Crecencio Mc, MD as PCP - General (Internal Medicine) Minna Merritts, MD as PCP - Cardiology (Cardiology) Telford Nab, RN as Registered Nurse  Indicate any recent Medical Services you may have received from other than [facility] providers in the past year (date may be approximate).     Assessment:   This is a routine wellness examination for Alexis James.  Virtual Visit via Telephone Note  I connected with  Alexis James on 08/09/22 at 10:30 AM EDT by telephone and verified that I am speaking with the correct person using two identifiers.  Location: Patient: home Provider: office Persons participating in the virtual visit: patient/Nurse Health Advisor   I discussed the limitations of performing an evaluation and management service by telehealth. We continued and completed visit with audio only. Some vital signs may be absent or patient reported.   Hearing/Vision screen Hearing Screening - Comments:: Patient is able to hear conversational tones without difficulty. No issues reported.  Vision Screening - Comments:: Followed by Dr. Matilde Sprang  Wears corrective lenses when reading  Cataract extraction, bilateral  They have seen their ophthalmologist in the last 12 months.   Dietary issues and exercise activities discussed: Current Exercise Habits: Home exercise routine, Type of exercise: walking, Intensity: Mild Healthy diet Fair water intake   Goals Addressed               This Visit's Progress      Patient Stated     I need to walk more (pt-stated)        Walk for exercise       Other     DIET - INCREASE LEAN PROTEINS   On track     Healthy diet Low carb foods       Depression Screen    08/09/2022   10:40 AM 02/17/2022    5:24 PM 08/05/2021    8:26 AM 05/18/2021    2:20 PM 02/04/2021    9:01 AM 03/28/2020    9:26 AM 06/07/2019    7:20 PM  PHQ 2/9 Scores  PHQ - 2 Score 0 0 0 0 0 0 0    Fall Risk    08/09/2022   10:43 AM 02/17/2022    5:24 PM 08/05/2021    8:26 AM 05/18/2021    2:23 PM 02/04/2021    9:00 AM  Fall Risk   Falls in the past year? 0 0 0 0 1  Number falls in past yr: 0   0 0  Injury with Fall? 0   0 0  Risk for fall due to :  No Fall Risks     Follow up Falls evaluation completed Falls evaluation completed Falls evaluation completed Falls evaluation completed Falls evaluation completed    [place]: Home free of loose throw rugs in walkways, pet beds, electrical cords, etc? Yes  Adequate lighting in your home to reduce risk of falls? Yes   ASSISTIVE DEVICES UTILIZED TO PREVENT FALLS: Life alert? No  Use of a cane, walker or w/c? No   TIMED UP AND GO: Was the test performed? No .   Cognitive Function:        08/09/2022   11:04 AM 03/28/2020    9:32 AM  6CIT Screen  What Year? 0 points 0 points  What month? 0 points 0 points  What time? 0 points 0 points  Count back from 20 0 points   Months in reverse 0 points 0 points  Repeat phrase 0 points   Total Score 0 points     Immunizations Immunization History  Administered Date(s) Administered   Influenza Split 10/15/2011, 10/30/2014, 11/03/2015   Influenza, High Dose Seasonal PF 09/28/2017, 09/01/2019   Influenza-Unspecified 08/13/2012, 09/05/2018, 09/10/2020, 09/03/2021   PFIZER(Purple Top)SARS-COV-2 Vaccination 01/24/2020, 02/18/2020, 10/10/2020   Pneumococcal Conjugate-13 04/05/2018   Pneumococcal Polysaccharide-23 04/02/2016, 09/01/2019   Tdap 04/17/2015    Zoster Recombinat (Shingrix) 04/05/2017, 06/28/2017   Zoster, Live 03/26/2010   Covid-19 vaccine status: Completed vaccines x3.  Screening Tests Health Maintenance  Topic Date Due   COVID-19 Vaccine (4 - Pfizer risk series) 08/25/2022 (Originally 12/05/2020)   INFLUENZA VACCINE  03/13/2023 (Originally 07/13/2022)   MAMMOGRAM  08/27/2022   Fecal DNA (Cologuard)  08/18/2024   TETANUS/TDAP  04/16/2025   Pneumonia Vaccine 25+ Years old  Completed   DEXA SCAN  Completed   Hepatitis C Screening  Completed   Zoster Vaccines- Shingrix  Completed   HPV VACCINES  Aged Out   COLONOSCOPY (Pts 45-6yr Insurance coverage will need to be confirmed)  Discontinued   Health Maintenance There are no preventive care reminders to display for this patient.  Lung Cancer Screening: (Low Dose CT Chest recommended if Age 388-80years, 30 pack-year currently smoking OR have quit w/in 15years.) does not qualify.   Vision Screening: Recommended annual ophthalmology exams for early detection of glaucoma and other disorders of the eye.  Dental Screening: Recommended annual dental exams for proper oral hygiene  Community Resource Referral / Chronic Care Management: CRR required this visit?  No   CCM required this visit?  No      Plan:     I have personally reviewed and noted the following in the patient's chart:   Medical and social history Use of alcohol, tobacco or illicit drugs  Current medications and supplements including opioid prescriptions. Patient is not currently taking opioid prescriptions. Functional ability and status Nutritional status Physical activity Advanced directives List of other physicians Hospitalizations, surgeries, and ER visits in previous 12 months Vitals Screenings to include cognitive, depression, and falls Referrals and appointments  In addition, I have reviewed and discussed with patient certain preventive protocols, quality metrics, and best practice  recommendations. A written personalized care plan for preventive services as well as general preventive health recommendations were provided to patient.     OBrien-Blaney, Iriana Artley L, LPN   [id number]/06/[id number]     I have reviewed the above information and agree with above.   TDeborra Medina MD

## 2022-08-10 NOTE — Telephone Encounter (Signed)
Medication has been refilled. Pt is aware.  

## 2022-08-10 NOTE — Telephone Encounter (Signed)
Patient is going out of town today and needs her escitalopram (LEXAPRO) 10 MG tablet refilled.

## 2022-08-13 DIAGNOSIS — I959 Hypotension, unspecified: Secondary | ICD-10-CM | POA: Diagnosis not present

## 2022-08-13 DIAGNOSIS — A059 Bacterial foodborne intoxication, unspecified: Secondary | ICD-10-CM | POA: Diagnosis not present

## 2022-08-13 DIAGNOSIS — R197 Diarrhea, unspecified: Secondary | ICD-10-CM | POA: Diagnosis not present

## 2022-08-13 DIAGNOSIS — Z880 Allergy status to penicillin: Secondary | ICD-10-CM | POA: Diagnosis not present

## 2022-08-13 DIAGNOSIS — R1111 Vomiting without nausea: Secondary | ICD-10-CM | POA: Diagnosis not present

## 2022-08-26 ENCOUNTER — Ambulatory Visit: Payer: PPO | Admitting: Internal Medicine

## 2022-08-27 ENCOUNTER — Ambulatory Visit (INDEPENDENT_AMBULATORY_CARE_PROVIDER_SITE_OTHER): Payer: PPO | Admitting: Internal Medicine

## 2022-08-27 ENCOUNTER — Encounter: Payer: Self-pay | Admitting: Internal Medicine

## 2022-08-27 VITALS — BP 144/74 | HR 80 | Temp 97.7°F | Ht 62.5 in | Wt 136.6 lb

## 2022-08-27 DIAGNOSIS — I700 Atherosclerosis of aorta: Secondary | ICD-10-CM | POA: Diagnosis not present

## 2022-08-27 DIAGNOSIS — R7303 Prediabetes: Secondary | ICD-10-CM

## 2022-08-27 DIAGNOSIS — Z23 Encounter for immunization: Secondary | ICD-10-CM

## 2022-08-27 DIAGNOSIS — R1319 Other dysphagia: Secondary | ICD-10-CM

## 2022-08-27 DIAGNOSIS — E782 Mixed hyperlipidemia: Secondary | ICD-10-CM

## 2022-08-27 MED ORDER — OMEPRAZOLE 40 MG PO CPDR: 40.0000 mg | DELAYED_RELEASE_CAPSULE | Freq: Every day | ORAL | 3 refills | Status: DC

## 2022-08-27 NOTE — Patient Instructions (Signed)
I believe you are having esophageal SPASMS which are caused by reflux. (See below)  They will cause food to STOP  for a while  I am placing you on omeprazole to reduce your reflux and and ordering a barium x ray of your esophagus to get done before you see Dr Allen Norris

## 2022-08-27 NOTE — Progress Notes (Unsigned)
Subjective:  Patient ID: Alexis James, female    DOB: Feb 28, 1951  Age: 71 y.o. MRN: [id number]  CC: The primary encounter diagnosis was Mixed hyperlipidemia. Diagnoses of Prediabetes, Need for pneumococcal 20-valent conjugate vaccination, Esophageal dysphagia, Need for immunization against influenza, and Aortic atherosclerosis ([place]) were also pertinent to this visit.   HPI Tameeka Luo Plantz presents for follow up on multiple issues.  Chief Complaint  Patient presents with   Follow-up    6 month follow up. Pt would also like to discuss hernia.    1) Had a "dizzy spell" recently ,  denies vertigo,  just felt unsteady on feet. Lasted about 30 minutes while at a friend's house.    2) infrequent episodes of chest pain occurring when eating certain foods   feels like the food stops at her xiphoid process  , followed by the occurrence of chest pain.  Tshe has had 2 episodes in the past month,  some months not at all.  Chewing a Tums helps.  No prior EGD.  No history of GERD or use of PPI   3) Aortic atherosclerosis:  tolerating rosuvastatin   Outpatient Medications Prior to Visit  Medication Sig Dispense Refill   Calcium Carbonate-Vitamin D3 600-400 MG-UNIT TABS Take 1 tablet by mouth daily.     cholecalciferol (VITAMIN D) 25 MCG (1000 UNIT) tablet Take 1,000 Units by mouth daily.     escitalopram (LEXAPRO) 10 MG tablet Take 1 tablet by mouth once daily 90 tablet 1   rosuvastatin (CRESTOR) 10 MG tablet Take 1 tablet (10 mg total) by mouth daily. 90 tablet 3   solifenacin (VESICARE) 5 MG tablet Take 1 tablet (5 mg total) by mouth daily. 90 tablet 1   furosemide (LASIX) 20 MG tablet Take 1 tablet (20 mg total) by mouth daily. As needed for fluid retention (Patient not taking: Reported on 02/17/2022) 15 tablet 1   No facility-administered medications prior to visit.    Review of Systems;  Patient denies headache, fevers, malaise, unintentional weight loss, skin rash, eye pain, sinus congestion  and sinus pain, sore throat, dysphagia,  hemoptysis , cough, dyspnea, wheezing, chest pain, palpitations, orthopnea, edema, abdominal pain, nausea, melena, diarrhea, constipation, flank pain, dysuria, hematuria, urinary  Frequency, nocturia, numbness, tingling, seizures,  Focal weakness, Loss of consciousness,  Tremor, insomnia, depression, anxiety, and suicidal ideation.      Objective:  BP (!) 144/74 (BP Location: Left Arm, Patient Position: Sitting, Cuff Size: Normal)   Pulse 80   Temp 97.7 F (36.5 C) (Oral)   Ht 5' 2.5" (1.588 m)   Wt 136 lb 9.6 oz (62 kg)   SpO2 95%   BMI 24.59 kg/m   BP Readings from Last 3 Encounters:  08/27/22 (!) 144/74  02/20/22 130/68  10/27/21 129/67    Wt Readings from Last 3 Encounters:  08/27/22 136 lb 9.6 oz (62 kg)  08/09/22 132 lb (59.9 kg)  02/20/22 132 lb (59.9 kg)    General appearance: alert, cooperative and appears stated age Ears: normal TM's and external ear canals both ears Throat: lips, mucosa, and tongue normal; teeth and gums normal Neck: no adenopathy, no carotid bruit, supple, symmetrical, trachea midline and thyroid not enlarged, symmetric, no tenderness/mass/nodules Back: symmetric, no curvature. ROM normal. No CVA tenderness. Lungs: clear to auscultation bilaterally Heart: regular rate and rhythm, S1, S2 normal, no murmur, click, rub or gallop Abdomen: soft, non-tender; bowel sounds normal; no masses,  no organomegaly Pulses: 2+ and symmetric Skin:  Skin color, texture, turgor normal. No rashes or lesions Lymph nodes: Cervical, supraclavicular, and axillary nodes normal. Neuro:  awake and interactive with normal mood and affect. Higher cortical functions are normal. Speech is clear without word-finding difficulty or dysarthria. Extraocular movements are intact. Visual fields of both eyes are grossly intact. Sensation to light touch is grossly intact bilaterally of upper and lower extremities. Motor examination shows 4+/5  symmetric hand grip and upper extremity and 5/5 lower extremity strength. There is no pronation or drift. Gait is non-ataxic   Lab Results  Component Value Date   HGBA1C 6.2 02/16/2022   HGBA1C 6.2 08/05/2021   HGBA1C 6.1 02/04/2021    Lab Results  Component Value Date   CREATININE 0.79 05/21/2022   CREATININE 0.79 02/16/2022   CREATININE 0.78 08/05/2021    Lab Results  Component Value Date   WBC 3.3 (L) 05/21/2022   HGB 12.9 05/21/2022   HCT 38.0 05/21/2022   PLT 272.0 05/21/2022   GLUCOSE 84 05/21/2022   CHOL 180 05/21/2022   TRIG 109.0 05/21/2022   HDL 63.50 05/21/2022   LDLDIRECT 92.0 05/21/2022   LDLCALC 94 05/21/2022   ALT 16 05/21/2022   AST 22 05/21/2022   NA 140 05/21/2022   K 4.1 05/21/2022   CL 104 05/21/2022   CREATININE 0.79 05/21/2022   BUN 22 05/21/2022   CO2 29 05/21/2022   TSH 4.40 02/16/2022   HGBA1C 6.2 02/16/2022   MICROALBUR <0.7 05/21/2022    No results found.  Assessment & Plan:   Problem List Items Addressed This Visit     Aortic atherosclerosis ([place])    Tolerating  Crestor 10 mg daily  Lab Results  Component Value Date   CHOL 180 05/21/2022   HDL 63.50 05/21/2022   LDLCALC 94 05/21/2022   LDLDIRECT 92.0 05/21/2022   TRIG 109.0 05/21/2022   CHOLHDL 3 05/21/2022         Esophageal dysphagia    Suggested by history of chest pain occurring while eating certain foods (chicken). ddx suggest spasm rather than stricture.  No weight loss,  Random occurrences.  DG esophagus ordered,  PPI prescribed.  Referral to [name] for EGD       Relevant Orders   DG ESOPHAGUS W SINGLE CM (SOL OR THIN BA)   Ambulatory referral to Gastroenterology   Hyperlipidemia - Primary   Relevant Orders   Lipid panel   Comprehensive metabolic panel   Prediabetes   Relevant Orders   Hemoglobin A1c   Other Visit Diagnoses     Need for pneumococcal 20-valent conjugate vaccination       Relevant Orders   Pneumococcal conjugate vaccine 20-valent  (Prevnar 20) (Completed)   Need for immunization against influenza       Relevant Orders   Flu Vaccine QUAD High Dose(Fluad) (Completed)       I spent a total of  30  minutes with this patient in a face to face visit on the date of this encounter reviewing the last office visit with me ,  most recent visit with pulmonology and GI . patient's diet and exercise habits,  and post visit ordering of testing and therapeutics.    Follow-up: No follow-ups on file.   Crecencio Mc, MD

## 2022-08-28 DIAGNOSIS — R1319 Other dysphagia: Secondary | ICD-10-CM | POA: Insufficient documentation

## 2022-08-28 NOTE — Assessment & Plan Note (Signed)
Tolerating  Crestor 10 mg daily  Lab Results  Component Value Date   CHOL 180 05/21/2022   HDL 63.50 05/21/2022   LDLCALC 94 05/21/2022   LDLDIRECT 92.0 05/21/2022   TRIG 109.0 05/21/2022   CHOLHDL 3 05/21/2022

## 2022-08-28 NOTE — Assessment & Plan Note (Addendum)
Suggested by history of chest pain occurring while eating certain foods (chicken). ddx suggest spasm rather than stricture.  No weight loss,  Random occurrences.  DG esophagus ordered,  PPI prescribed.  Referral to [name] for EGD

## 2022-08-30 ENCOUNTER — Ambulatory Visit
Admission: RE | Admit: 2022-08-30 | Discharge: 2022-08-30 | Disposition: A | Discharge status: Home / Self Care | Payer: PPO | Source: Ambulatory Visit | Attending: Internal Medicine | Admitting: Internal Medicine

## 2022-08-30 DIAGNOSIS — Z1231 Encounter for screening mammogram for malignant neoplasm of breast: Secondary | ICD-10-CM | POA: Insufficient documentation

## 2022-09-02 DIAGNOSIS — C44712 Basal cell carcinoma of skin of right lower limb, including hip: Secondary | ICD-10-CM | POA: Diagnosis not present

## 2022-09-02 DIAGNOSIS — Z85828 Personal history of other malignant neoplasm of skin: Secondary | ICD-10-CM | POA: Diagnosis not present

## 2022-09-02 DIAGNOSIS — L298 Other pruritus: Secondary | ICD-10-CM | POA: Diagnosis not present

## 2022-09-02 DIAGNOSIS — D0471 Carcinoma in situ of skin of right lower limb, including hip: Secondary | ICD-10-CM | POA: Diagnosis not present

## 2022-09-02 DIAGNOSIS — L538 Other specified erythematous conditions: Secondary | ICD-10-CM | POA: Diagnosis not present

## 2022-09-02 DIAGNOSIS — D2272 Melanocytic nevi of left lower limb, including hip: Secondary | ICD-10-CM | POA: Diagnosis not present

## 2022-09-02 DIAGNOSIS — L820 Inflamed seborrheic keratosis: Secondary | ICD-10-CM | POA: Diagnosis not present

## 2022-09-02 DIAGNOSIS — D2271 Melanocytic nevi of right lower limb, including hip: Secondary | ICD-10-CM | POA: Diagnosis not present

## 2022-09-02 DIAGNOSIS — D2261 Melanocytic nevi of right upper limb, including shoulder: Secondary | ICD-10-CM | POA: Diagnosis not present

## 2022-09-02 DIAGNOSIS — D485 Neoplasm of uncertain behavior of skin: Secondary | ICD-10-CM | POA: Diagnosis not present

## 2022-09-20 DIAGNOSIS — D0471 Carcinoma in situ of skin of right lower limb, including hip: Secondary | ICD-10-CM | POA: Diagnosis not present

## 2022-09-20 DIAGNOSIS — C44712 Basal cell carcinoma of skin of right lower limb, including hip: Secondary | ICD-10-CM | POA: Diagnosis not present

## 2022-10-11 ENCOUNTER — Other Ambulatory Visit: Payer: Self-pay | Admitting: Internal Medicine

## 2022-11-12 ENCOUNTER — Other Ambulatory Visit: Payer: Self-pay | Admitting: Internal Medicine

## 2022-11-26 ENCOUNTER — Other Ambulatory Visit (INDEPENDENT_AMBULATORY_CARE_PROVIDER_SITE_OTHER): Payer: PPO

## 2022-11-26 DIAGNOSIS — R7303 Prediabetes: Secondary | ICD-10-CM

## 2022-11-26 DIAGNOSIS — E782 Mixed hyperlipidemia: Secondary | ICD-10-CM | POA: Diagnosis not present

## 2022-11-26 LAB — COMPREHENSIVE METABOLIC PANEL
ALT: 18 U/L (ref 0–35)
AST: 22 U/L (ref 0–37)
Albumin: 4 g/dL (ref 3.5–5.2)
Alkaline Phosphatase: 67 U/L (ref 39–117)
BUN: 17 mg/dL (ref 6–23)
CO2: 29 mEq/L (ref 19–32)
Calcium: 9.3 mg/dL (ref 8.4–10.5)
Chloride: 104 mEq/L (ref 96–112)
Creatinine, Ser: 0.74 mg/dL (ref 0.40–1.20)
GFR: 80.77 mL/min (ref >60.00)
Glucose, Bld: 87 mg/dL (ref 70–99)
Potassium: 4.4 mEq/L (ref 3.5–5.1)
Sodium: 142 mEq/L (ref 135–145)
Total Bilirubin: 0.4 mg/dL (ref 0.2–1.2)
Total Protein: 6.1 g/dL (ref 6.0–8.3)

## 2022-11-26 LAB — LIPID PANEL
Cholesterol: 162 mg/dL (ref 0–200)
HDL: 59.7 mg/dL (ref >39.00)
LDL Cholesterol: 85 mg/dL (ref 0–99)
NonHDL: 102.42
Total CHOL/HDL Ratio: 3
Triglycerides: 85 mg/dL (ref 0.0–149.0)
VLDL: 17 mg/dL (ref 0.0–40.0)

## 2022-11-26 LAB — HEMOGLOBIN A1C: Hgb A1c MFr Bld: 6.1 % (ref 4.6–6.5)

## 2023-01-05 ENCOUNTER — Other Ambulatory Visit: Payer: Self-pay | Admitting: Internal Medicine

## 2023-01-06 ENCOUNTER — Ambulatory Visit (INDEPENDENT_AMBULATORY_CARE_PROVIDER_SITE_OTHER): Payer: PPO | Admitting: Gastroenterology

## 2023-01-06 ENCOUNTER — Encounter: Payer: Self-pay | Admitting: Gastroenterology

## 2023-01-06 VITALS — BP 128/73 | HR 80 | Temp 97.9°F | Ht 62.5 in | Wt 135.0 lb

## 2023-01-06 DIAGNOSIS — K219 Gastro-esophageal reflux disease without esophagitis: Secondary | ICD-10-CM | POA: Diagnosis not present

## 2023-01-06 DIAGNOSIS — R1319 Other dysphagia: Secondary | ICD-10-CM | POA: Diagnosis not present

## 2023-01-06 MED ORDER — PANTOPRAZOLE SODIUM 40 MG PO TBEC: 40.0000 mg | DELAYED_RELEASE_TABLET | Freq: Every day | ORAL | 5 refills | Status: DC

## 2023-01-06 NOTE — Progress Notes (Signed)
Primary Care Physician: Crecencio Mc, MD  Primary Gastroenterologist:  Dr. [name]  Chief Complaint  Patient presents with   New Patient (Initial Visit)   Dysphagia    Pt reports some improvement since starting Omeprazole, but states she has noticed an increase of regurgitation    HPI: Alexis James is a 72 y.o. female here with a history of seeing me for a colonoscopy back in 2022.  The patient had a single polyp at that time.  The patient have a colonoscopy for positive Cologuard test.  The patient was recently seen by her PCP and reported infrequent episodes of chest pain with certain foods.  The chest pain was in the lower part of her chest.  She had had a few episodes of this over the next few months.  The patient had reported chewing Tums helped her symptoms.  She had no history of prior GERD. She says that food feels like it is getting stuck. It happened with corn bread and french fries.She has been on Omeprazole and the symptoms of dysphagia better but had had worse heart burn. The patient reports that she is lost a couple pounds without trying but denies any significant weight loss.  Past Medical History:  Diagnosis Date   Anxiety    Cancer ([place])    skin   Diabetes mellitus without complication ([place])    Hyperlipidemia    Orthostatic hypotension 07/10/2019   Syncope and collapse 05/02/2019    Current Outpatient Medications  Medication Sig Dispense Refill   Calcium Carbonate-Vitamin D3 600-400 MG-UNIT TABS Take 1 tablet by mouth daily.     cholecalciferol (VITAMIN D) 25 MCG (1000 UNIT) tablet Take 1,000 Units by mouth daily.     escitalopram (LEXAPRO) 10 MG tablet Take 1 tablet by mouth once daily 90 tablet 1   furosemide (LASIX) 20 MG tablet Take 1 tablet (20 mg total) by mouth daily. As needed for fluid retention 15 tablet 1   omeprazole (PRILOSEC) 40 MG capsule Take 1 capsule by mouth once daily 60 capsule 2   rosuvastatin (CRESTOR) 10 MG tablet Take 1 tablet (10  mg total) by mouth daily. 90 tablet 3   solifenacin (VESICARE) 5 MG tablet Take 1 tablet by mouth once daily 90 tablet 0   No current facility-administered medications for this visit.    Allergies as of 01/06/2023 - Review Complete 01/06/2023  Allergen Reaction Noted   Penicillins  08/13/2022    ROS:  General: Negative for anorexia, weight loss, fever, chills, fatigue, weakness. ENT: Negative for hoarseness, difficulty swallowing , nasal congestion. CV: Negative for chest pain, angina, palpitations, dyspnea on exertion, peripheral edema.  Respiratory: Negative for dyspnea at rest, dyspnea on exertion, cough, sputum, wheezing.  GI: See history of present illness. GU:  Negative for dysuria, hematuria, urinary incontinence, urinary frequency, nocturnal urination.  Endo: Negative for unusual weight change.    Physical Examination:   BP 128/73 (BP Location: Left Arm, Patient Position: Sitting, Cuff Size: Normal)   Pulse 80   Temp 97.9 F (36.6 C) (Oral)   Ht 5' 2.5" (1.588 m)   Wt 135 lb (61.2 kg)   BMI 24.30 kg/m   General: Well-nourished, well-developed in no acute distress.  Eyes: No icterus. Conjunctivae pink. Lungs: Clear to auscultation bilaterally. Non-labored. Heart: Regular rate and rhythm, no murmurs rubs or gallops.  Abdomen: Bowel sounds are normal, nontender, nondistended, no hepatosplenomegaly or masses, no abdominal bruits or hernia , no rebound or guarding.  Extremities: No lower extremity edema. No clubbing or deformities. Neuro: Alert and oriented x 3.  Grossly intact. Skin: Warm and dry, no jaundice.   Psych: Alert and cooperative, normal mood and affect.  Labs:    Imaging Studies: No results found.  Assessment and Plan:   Alexis James is a 72 y.o. y/o female who comes in today with a history of dysphagia where she states that food feels like it is getting stuck in her midesophagus.  She has not had to vomit the food down and it usually goes down by  itself.  The patient also is not taking a PPI at the present time.  The patient will be started on Protonix 40 mg a day.  The patient will also be set up for an EGD due to her dysphagia.  The patient has been explained the plan and agrees with it.     [name], MD. Marval Regal    Note: This dictation was prepared with Dragon dictation along with smaller phrase technology. Any transcriptional errors that result from this process are unintentional.

## 2023-01-17 ENCOUNTER — Encounter: Payer: Self-pay | Admitting: Gastroenterology

## 2023-01-20 ENCOUNTER — Encounter: Payer: Self-pay | Admitting: Gastroenterology

## 2023-01-20 ENCOUNTER — Encounter
Admission: RE | Disposition: A | Discharge status: Home / Self Care | Payer: Self-pay | Source: Ambulatory Visit | Attending: Gastroenterology

## 2023-01-20 ENCOUNTER — Other Ambulatory Visit: Payer: Self-pay

## 2023-01-20 ENCOUNTER — Ambulatory Visit
Admission: RE | Admit: 2023-01-20 | Discharge: 2023-01-20 | Disposition: A | Discharge status: Home / Self Care | Payer: PPO | Source: Ambulatory Visit | Attending: Gastroenterology | Admitting: Gastroenterology

## 2023-01-20 ENCOUNTER — Ambulatory Visit: Payer: PPO | Admitting: Anesthesiology

## 2023-01-20 DIAGNOSIS — K222 Esophageal obstruction: Secondary | ICD-10-CM | POA: Diagnosis not present

## 2023-01-20 DIAGNOSIS — R1310 Dysphagia, unspecified: Secondary | ICD-10-CM | POA: Diagnosis not present

## 2023-01-20 DIAGNOSIS — E119 Type 2 diabetes mellitus without complications: Secondary | ICD-10-CM | POA: Insufficient documentation

## 2023-01-20 DIAGNOSIS — K449 Diaphragmatic hernia without obstruction or gangrene: Secondary | ICD-10-CM | POA: Insufficient documentation

## 2023-01-20 DIAGNOSIS — E785 Hyperlipidemia, unspecified: Secondary | ICD-10-CM | POA: Diagnosis not present

## 2023-01-20 DIAGNOSIS — K219 Gastro-esophageal reflux disease without esophagitis: Secondary | ICD-10-CM | POA: Diagnosis not present

## 2023-01-20 DIAGNOSIS — Z8379 Family history of other diseases of the digestive system: Secondary | ICD-10-CM | POA: Diagnosis not present

## 2023-01-20 DIAGNOSIS — R1319 Other dysphagia: Secondary | ICD-10-CM

## 2023-01-20 HISTORY — PX: ESOPHAGOGASTRODUODENOSCOPY (EGD) WITH PROPOFOL: SHX5813

## 2023-01-20 HISTORY — PX: ESOPHAGEAL DILATION: SHX303

## 2023-01-20 SURGERY — ESOPHAGOGASTRODUODENOSCOPY (EGD) WITH PROPOFOL
Anesthesia: General | Site: Esophagus

## 2023-01-20 MED ORDER — PROPOFOL 10 MG/ML IV BOLUS
INTRAVENOUS | Status: DC | PRN
  Administered 2023-01-20: 100 mg via INTRAVENOUS

## 2023-01-20 MED ORDER — LACTATED RINGERS IV SOLN: INTRAVENOUS | Status: DC

## 2023-01-20 MED ORDER — SODIUM CHLORIDE 0.9 % IV SOLN: INTRAVENOUS | Status: DC

## 2023-01-20 MED ORDER — LIDOCAINE HCL (CARDIAC) PF 100 MG/5ML IV SOSY
PREFILLED_SYRINGE | INTRAVENOUS | Status: DC | PRN
  Administered 2023-01-20: 100 mg via INTRAVENOUS

## 2023-01-20 SURGICAL SUPPLY — 32 items
BALLN DILATOR 12-15 8 (BALLOONS) ×2
BALLN DILATOR 15-18 8 (BALLOONS)
BALLN DILATOR CRE 0-12 8 (BALLOONS)
BALLN DILATOR ESOPH 8 10 CRE (MISCELLANEOUS) IMPLANT
BALLOON DILATOR 12-15 8 (BALLOONS) IMPLANT
BALLOON DILATOR 15-18 8 (BALLOONS) IMPLANT
BALLOON DILATOR CRE 0-12 8 (BALLOONS) IMPLANT
BLOCK BITE 60FR ADLT L/F GRN (MISCELLANEOUS) ×3 IMPLANT
CLIP HMST 235XBRD CATH ROT (MISCELLANEOUS) IMPLANT
CLIP RESOLUTION 360 11X235 (MISCELLANEOUS)
ELECT REM PT RETURN 9FT ADLT (ELECTROSURGICAL)
ELECTRODE REM PT RTRN 9FT ADLT (ELECTROSURGICAL) IMPLANT
FCP ESCP3.2XJMB 240X2.8X (MISCELLANEOUS)
FORCEPS BIOP RAD 4 LRG CAP 4 (CUTTING FORCEPS) IMPLANT
FORCEPS BIOP RJ4 240 W/NDL (MISCELLANEOUS)
FORCEPS ESCP3.2XJMB 240X2.8X (MISCELLANEOUS) IMPLANT
GOWN CVR UNV OPN BCK APRN NK (MISCELLANEOUS) ×6 IMPLANT
GOWN ISOL THUMB LOOP REG UNIV (MISCELLANEOUS) ×4
INJECTOR VARIJECT VIN23 (MISCELLANEOUS) IMPLANT
KIT DEFENDO VALVE AND CONN (KITS) IMPLANT
KIT PRC NS LF DISP ENDO (KITS) ×3 IMPLANT
KIT PROCEDURE OLYMPUS (KITS) ×2
MANIFOLD NEPTUNE II (INSTRUMENTS) ×3 IMPLANT
MARKER SPOT ENDO TATTOO 5ML (MISCELLANEOUS) IMPLANT
RETRIEVER NET PLAT FOOD (MISCELLANEOUS) IMPLANT
SNARE SHORT THROW 13M SML OVAL (MISCELLANEOUS) IMPLANT
SNARE SHORT THROW 30M LRG OVAL (MISCELLANEOUS) IMPLANT
SYR INFLATION 60ML (SYRINGE) IMPLANT
TRAP ETRAP POLY (MISCELLANEOUS) IMPLANT
VARIJECT INJECTOR VIN23 (MISCELLANEOUS)
WATER STERILE IRR 250ML POUR (IV SOLUTION) ×3 IMPLANT
WIRE CRE 18-20MM 8CM F G (MISCELLANEOUS) IMPLANT

## 2023-01-20 NOTE — Anesthesia Postprocedure Evaluation (Signed)
Anesthesia Post Note  Patient: Alexis James  Procedure(s) Performed: ESOPHAGOGASTRODUODENOSCOPY (EGD) WITH PROPOFOL ESOPHAGEAL DILATION (Esophagus)  Patient location during evaluation: PACU Anesthesia Type: General Level of consciousness: awake and alert Pain management: pain level controlled Vital Signs Assessment: post-procedure vital signs reviewed and stable Respiratory status: spontaneous breathing, nonlabored ventilation, respiratory function stable and patient connected to nasal cannula oxygen Cardiovascular status: blood pressure returned to baseline and stable Postop Assessment: no apparent nausea or vomiting Anesthetic complications: no   No notable events documented.   Last Vitals:  Vitals:   01/20/23 1000 01/20/23 1005  BP: 123/60 122/61  Pulse: 67 63  Resp: 13 13  Temp:  36.9 C  SpO2: 94% 95%    Last Pain:  Vitals:   01/20/23 1005  TempSrc:   PainSc: 0-No pain                 Martha Clan

## 2023-01-20 NOTE — Transfer of Care (Signed)
Immediate Anesthesia Transfer of Care Note  Patient: Alexis James  Procedure(s) Performed: ESOPHAGOGASTRODUODENOSCOPY (EGD) WITH PROPOFOL ESOPHAGEAL DILATION (Esophagus)  Patient Location: PACU  Anesthesia Type: General  Level of Consciousness: awake, alert  and patient cooperative  Airway and Oxygen Therapy: Patient Spontanous Breathing and Patient connected to supplemental oxygen  Post-op Assessment: Post-op Vital signs reviewed, Patient's Cardiovascular Status Stable, Respiratory Function Stable, Patent Airway and No signs of Nausea or vomiting  Post-op Vital Signs: Reviewed and stable  Complications: No notable events documented.

## 2023-01-20 NOTE — H&P (Signed)
[name], MD [address] [place], [place] [address] Phone:[phone] Fax : [phone]  Primary Care Physician:  Crecencio Mc, MD Primary Gastroenterologist:  Dr. Allen Norris  Pre-Procedure History & Physical: HPI:  Alexis James is a 72 y.o. female is here for an endoscopy.   Past Medical History:  Diagnosis Date   Anxiety    Cancer ([place])    skin   Diabetes mellitus without complication ([place])    Hyperlipidemia    Orthostatic hypotension 07/10/2019   Syncope and collapse 05/02/2019    Past Surgical History:  Procedure Laterality Date   BREAST SURGERY     breast reduction   CATARACT EXTRACTION W/PHACO Right 10/18/2017   Procedure: CATARACT EXTRACTION PHACO AND INTRAOCULAR LENS PLACEMENT ([place]);  Surgeon: Birder Robson, MD;  Location: [facility] ORS;  Service: Ophthalmology;  Laterality: Right;  [place] 00:27 AP% 19.7 CDE5.32 fluid pack lot # [id number]   CATARACT EXTRACTION W/PHACO Left 11/21/2018   Procedure: CATARACT EXTRACTION PHACO AND INTRAOCULAR LENS PLACEMENT ([facility]);  Surgeon: Birder Robson, MD;  Location: [facility] ORS;  Service: Ophthalmology;  Laterality: Left;  [place] 00:31.3 CDE 5.38 Fluid Pack Lot # [id number]   CESAREAN SECTION     COLONOSCOPY WITH PROPOFOL N/A 10/27/2021   Procedure: COLONOSCOPY WITH PROPOFOL;  Surgeon: [name], MD;  Location: [facility]COPY;  Service: Endoscopy;  Laterality: N/A;   NOSE SURGERY     REDUCTION MAMMAPLASTY     smr      Prior to Admission medications   Medication Sig Start Date End Date Taking? Authorizing Provider  Calcium Carbonate-Vitamin D3 600-400 MG-UNIT TABS Take 1 tablet by mouth daily.   Yes [provider]  cholecalciferol (VITAMIN D) 25 MCG (1000 UNIT) tablet Take 1,000 Units by mouth daily.   Yes [provider]  Cyanocobalamin (B-12) 100 MCG TABS Take 100 mcg by mouth.   Yes [provider]  escitalopram (LEXAPRO) 10 MG tablet Take 1 tablet by mouth once daily 08/10/22  Yes  Crecencio Mc, MD  pantoprazole (PROTONIX) 40 MG tablet Take 1 tablet (40 mg total) by mouth daily. 01/06/23  Yes [name], MD  rosuvastatin (CRESTOR) 10 MG tablet Take 1 tablet (10 mg total) by mouth daily. 02/18/22  Yes Crecencio Mc, MD  solifenacin (VESICARE) 5 MG tablet Take 1 tablet by mouth once daily 01/06/23  Yes Crecencio Mc, MD  furosemide (LASIX) 20 MG tablet Take 1 tablet (20 mg total) by mouth daily. As needed for fluid retention Patient not taking: Reported on 01/17/2023 08/05/21   Crecencio Mc, MD    Allergies as of 01/06/2023 - Review Complete 01/06/2023  Allergen Reaction Noted   Penicillins  08/13/2022    Family History  Problem Relation Age of Onset   Colitis Mother 26   Mental retardation Father 14   Alzheimer's disease Father    Heart Problems Brother    Transient ischemic attack Sister    Cancer Neg Hx    Breast cancer Neg Hx     Social History   Socioeconomic History   Marital status: Married    Spouse name: Not on file   Number of children: 3   Years of education: Not on file   Highest education level: Not on file  Occupational History   Not on file  Tobacco Use   Smoking status: Never    Passive exposure: Yes   Smokeless tobacco: Never   Tobacco comments:    Husband smoked x 38  years (quit 4 years ago)  Vaping Use   Vaping Use: Never used  Substance and Sexual Activity   Alcohol use: Yes    Comment: Occasional   Drug use: No   Sexual activity: Not on file  Other Topics Concern   Not on file  [place] with husband; never smoked; ocassional wine; 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    Social Determinants of Health   Financial Resource Strain: Low Risk  (08/09/2022)   Overall Financial Resource Strain (CARDIA)    Difficulty of Paying Living Expenses: Not hard at all  Food Insecurity: No Food Insecurity (08/09/2022)   Hunger Vital Sign    Worried About Running Out of Food in the Last Year: Never true    [place] in the Last Year: Never true  Transportation Needs: No Transportation Needs (08/09/2022)   PRAPARE - Hydrologist (Medical): No    Lack of Transportation (Non-Medical): No  Physical Activity: Sufficiently Active (05/18/2021)   Exercise Vital Sign    Days of Exercise per Week: 5 days    Minutes of Exercise per Session: 30 min  Stress: No Stress Concern Present (08/09/2022)   [place]    Feeling of Stress : Not at all  Social Connections: Unknown (08/09/2022)   Social Connection and Isolation Panel [NHANES]    Frequency of Communication with Friends and Family: More than three times a week    Frequency of Social Gatherings with Friends and Family: More than three times a week    Attends Religious Services: Not on file    Active Member of [place] or Organizations: Not on file    Attends Archivist Meetings: Not on file    Marital Status: Not on file  Intimate Partner Violence: Not At Risk (08/09/2022)   Humiliation, Afraid, Rape, and Kick questionnaire    Fear of Current or Ex-Partner: No    Emotionally Abused: No    Physically Abused: No    Sexually Abused: No    Review of Systems: See HPI, otherwise negative ROS  Physical Exam: BP 136/69   Temp 98.8 F (37.1 C) (Tympanic)   Resp 14   Ht 5' 2.52" (1.588 m)   Wt 60.9 kg   SpO2 99%   BMI 24.14 kg/m  General:   Alert,  pleasant and cooperative in NAD Head:  Normocephalic and atraumatic. Neck:  Supple; no masses or thyromegaly. Lungs:  Clear throughout to auscultation.    Heart:  Regular rate and rhythm. Abdomen:  Soft, nontender and nondistended. Normal bowel sounds, without guarding, and without rebound.   Neurologic:  Alert and  oriented x4;  grossly normal neurologically.  Impression/Plan: [name] is here for an endoscopy to be performed for dysohagia  Risks, benefits, limitations, and alternatives  regarding  endoscopy have been reviewed with the patient.  Questions have been answered.  All parties agreeable.   [name], MD  01/20/2023, 9:30 AM

## 2023-01-20 NOTE — Anesthesia Preprocedure Evaluation (Signed)
Anesthesia Evaluation  Patient identified by MRN, date of birth, ID band Patient awake    Reviewed: Allergy & Precautions, H&P , NPO status , Patient's Chart, lab work & pertinent test results, reviewed documented beta blocker date and time   History of Anesthesia Complications Negative for: history of anesthetic complications  Airway Mallampati: III  TM Distance: >3 FB Neck ROM: full    Dental  (+) Dental Advidsory Given, Teeth Intact   Pulmonary neg pulmonary ROS   Pulmonary exam normal breath sounds clear to auscultation       Cardiovascular Exercise Tolerance: Good negative cardio ROS Normal cardiovascular exam Rhythm:regular Rate:Normal     Neuro/Psych negative neurological ROS  negative psych ROS   GI/Hepatic Neg liver ROS,GERD  ,,  Endo/Other  diabetes (borderline)    Renal/GU negative Renal ROS  negative genitourinary   Musculoskeletal   Abdominal   Peds  Hematology negative hematology ROS (+)   Anesthesia Other Findings Past Medical History: No date: Anxiety No date: Cancer [facility])     Comment:  skin No date: Diabetes mellitus without complication ([facility]) No date: Hyperlipidemia 07/10/2019: Orthostatic hypotension 05/02/2019: Syncope and collapse   Reproductive/Obstetrics negative OB ROS                             Anesthesia Physical Anesthesia Plan  ASA: 2  Anesthesia Plan: General   Post-op Pain Management:    Induction: Intravenous  PONV Risk Score and Plan: 3 and Propofol infusion and TIVA  Airway Management Planned: Natural Airway and Nasal Cannula  Additional Equipment:   Intra-op Plan:   Post-operative Plan:   Informed Consent: I have reviewed the patients History and Physical, chart, labs and discussed the procedure including the risks, benefits and alternatives for the proposed anesthesia with the patient or authorized representative who has indicated  his/her understanding and acceptance.     Dental Advisory Given  Plan Discussed with: Anesthesiologist, CRNA and Surgeon  Anesthesia Plan Comments:        Anesthesia Quick Evaluation

## 2023-01-20 NOTE — Op Note (Signed)
[facility] Gastroenterology Patient Name: Alexis James Procedure Date: 01/20/2023 9:29 AM MRN: [id number] Account #: [id number] Date of Birth: March 16, 1951 Admit Type: Outpatient Age: 72 Room: [facility] OR ROOM 01 Gender: Female Note Status: Finalized Instrument Name: [id number] Procedure:             Upper GI endoscopy Indications:           Dysphagia Providers:             [name] MD, MD Referring MD:          Deborra Medina, MD (Referring MD) Medicines:             Propofol per Anesthesia Complications:         No immediate complications. Procedure:             Pre-Anesthesia Assessment:                        - Prior to the procedure, a History and Physical was                         performed, and patient medications and allergies were                         reviewed. The patient's tolerance of previous                         anesthesia was also reviewed. The risks and benefits                         of the procedure and the sedation options and risks                         were discussed with the patient. All questions were                         answered, and informed consent was obtained. Prior                         Anticoagulants: The patient has taken no anticoagulant                         or antiplatelet agents. ASA Grade Assessment: II - A                         patient with mild systemic disease. After reviewing                         the risks and benefits, the patient was deemed in                         satisfactory condition to undergo the procedure.                        After obtaining informed consent, the endoscope was                         passed under direct vision. Throughout the procedure,  the patient's blood pressure, pulse, and oxygen                         saturations were monitored continuously. The Endoscope                         was introduced through the mouth, and advanced to the                          second part of duodenum. The upper GI endoscopy was                         accomplished without difficulty. The patient tolerated                         the procedure well. Findings:      A small hiatal hernia was present.      One benign-appearing, intrinsic mild stenosis was found in the distal       esophagus. The stenosis was traversed. A TTS dilator was passed through       the scope. Dilation with a 12-13.5-15 mm balloon dilator was performed       to 15 mm. The dilation site was examined following endoscope reinsertion       and showed moderate improvement in luminal narrowing.      The stomach was normal.      The examined duodenum was normal. Impression:            - Small hiatal hernia.                        - Benign-appearing esophageal stenosis. Dilated.                        - Normal stomach.                        - Normal examined duodenum.                        - No specimens collected. Recommendation:        - Discharge patient to home.                        - Resume previous diet.                        - Continue present medications. Procedure Code(s):     --- Professional ---                        [phone], Esophagogastroduodenoscopy, flexible,                         transoral; with transendoscopic balloon dilation of                         esophagus (less than 30 mm diameter) Diagnosis Code(s):     --- Professional ---                        R13.10, Dysphagia, unspecified  K22.2, Esophageal obstruction CPT copyright 2022 American Medical Association. All rights reserved. The codes documented in this report are preliminary and upon coder review may  be revised to meet current compliance requirements. [name] MD, MD 01/20/2023 9:49:03 AM This report has been signed electronically. Number of Addenda: 0 Note Initiated On: 01/20/2023 9:29 AM Total Procedure Duration: 0 hours 3 minutes 32 seconds  Estimated Blood Loss:  Estimated  blood loss: none.      [facility]

## 2023-01-21 ENCOUNTER — Encounter: Payer: Self-pay | Admitting: Gastroenterology

## 2023-02-01 ENCOUNTER — Telehealth: Payer: Self-pay | Admitting: Gastroenterology

## 2023-02-01 NOTE — Telephone Encounter (Signed)
Patient has a few medical questions. Requesting call back from Dr. Dorothey Baseman assistant.

## 2023-02-02 NOTE — Telephone Encounter (Signed)
Pt had questions about Pantoprazole and if she is must take 30 minutes before breakfast.... I answered patient's questions and nothing further needed

## 2023-02-03 ENCOUNTER — Telehealth: Payer: Self-pay

## 2023-02-03 MED ORDER — ESCITALOPRAM OXALATE 10 MG PO TABS: 10.0000 mg | ORAL_TABLET | Freq: Every day | ORAL | 1 refills | Status: DC

## 2023-02-03 NOTE — Telephone Encounter (Signed)
Medication has been refilled and pt is aware.

## 2023-02-03 NOTE — Telephone Encounter (Signed)
Prescription Request  02/03/2023  Is this a "Controlled Substance" medicine? Yes  LOV: Visit date not found  What is the name of the medication or equipment? escitalopram (LEXAPRO) 10 MG tablet  Have you contacted your pharmacy to request a refill? No   Which pharmacy would you like this sent to?  [facility] [place], [place] - [address] [address] Phone: [phone] Fax: [phone]   Patient notified that their request is being sent to the clinical staff for review and that they should receive a response within 2 business days.   Please advise at Mobile [phone] (mobile)  Patient states she would like to have a 90-day supply going forward and she is ready for a refill.

## 2023-02-16 ENCOUNTER — Other Ambulatory Visit: Payer: Self-pay | Admitting: Internal Medicine

## 2023-03-24 DIAGNOSIS — D2261 Melanocytic nevi of right upper limb, including shoulder: Secondary | ICD-10-CM | POA: Diagnosis not present

## 2023-03-24 DIAGNOSIS — D2262 Melanocytic nevi of left upper limb, including shoulder: Secondary | ICD-10-CM | POA: Diagnosis not present

## 2023-03-24 DIAGNOSIS — D2272 Melanocytic nevi of left lower limb, including hip: Secondary | ICD-10-CM | POA: Diagnosis not present

## 2023-03-24 DIAGNOSIS — D2271 Melanocytic nevi of right lower limb, including hip: Secondary | ICD-10-CM | POA: Diagnosis not present

## 2023-03-24 DIAGNOSIS — D485 Neoplasm of uncertain behavior of skin: Secondary | ICD-10-CM | POA: Diagnosis not present

## 2023-03-24 DIAGNOSIS — L821 Other seborrheic keratosis: Secondary | ICD-10-CM | POA: Diagnosis not present

## 2023-03-24 DIAGNOSIS — C44619 Basal cell carcinoma of skin of left upper limb, including shoulder: Secondary | ICD-10-CM | POA: Diagnosis not present

## 2023-03-24 DIAGNOSIS — D225 Melanocytic nevi of trunk: Secondary | ICD-10-CM | POA: Diagnosis not present

## 2023-03-24 DIAGNOSIS — L309 Dermatitis, unspecified: Secondary | ICD-10-CM | POA: Diagnosis not present

## 2023-03-28 ENCOUNTER — Encounter: Payer: Self-pay | Admitting: Internal Medicine

## 2023-03-28 ENCOUNTER — Ambulatory Visit (INDEPENDENT_AMBULATORY_CARE_PROVIDER_SITE_OTHER): Payer: PPO | Admitting: Internal Medicine

## 2023-03-28 VITALS — BP 112/62 | HR 73 | Temp 97.8°F | Ht 62.5 in | Wt 137.6 lb

## 2023-03-28 DIAGNOSIS — H6121 Impacted cerumen, right ear: Secondary | ICD-10-CM | POA: Diagnosis not present

## 2023-03-28 DIAGNOSIS — R1319 Other dysphagia: Secondary | ICD-10-CM | POA: Diagnosis not present

## 2023-03-28 DIAGNOSIS — E782 Mixed hyperlipidemia: Secondary | ICD-10-CM | POA: Diagnosis not present

## 2023-03-28 DIAGNOSIS — Z8719 Personal history of other diseases of the digestive system: Secondary | ICD-10-CM | POA: Diagnosis not present

## 2023-03-28 DIAGNOSIS — I700 Atherosclerosis of aorta: Secondary | ICD-10-CM

## 2023-03-28 DIAGNOSIS — R7303 Prediabetes: Secondary | ICD-10-CM | POA: Diagnosis not present

## 2023-03-28 DIAGNOSIS — K449 Diaphragmatic hernia without obstruction or gangrene: Secondary | ICD-10-CM | POA: Diagnosis not present

## 2023-03-28 DIAGNOSIS — E538 Deficiency of other specified B group vitamins: Secondary | ICD-10-CM

## 2023-03-28 DIAGNOSIS — E559 Vitamin D deficiency, unspecified: Secondary | ICD-10-CM

## 2023-03-28 MED ORDER — ROSUVASTATIN CALCIUM 10 MG PO TABS: 10.0000 mg | ORAL_TABLET | Freq: Every day | ORAL | 3 refills | Status: DC

## 2023-03-28 MED ORDER — SOLIFENACIN SUCCINATE 5 MG PO TABS: 5.0000 mg | ORAL_TABLET | Freq: Every day | ORAL | 1 refills | Status: DC

## 2023-03-28 MED ORDER — ESCITALOPRAM OXALATE 10 MG PO TABS: 10.0000 mg | ORAL_TABLET | Freq: Every day | ORAL | 1 refills | Status: DC

## 2023-03-28 MED ORDER — PANTOPRAZOLE SODIUM 40 MG PO TBEC: 40.0000 mg | DELAYED_RELEASE_TABLET | Freq: Every day | ORAL | 3 refills | Status: DC

## 2023-03-28 NOTE — Patient Instructions (Signed)
Continue Protonix daily : you can take it EARLY (WHEN YOU GET UP TO VOID)  Right ear needs to have  wax removed  Return in June for LABS ONLY

## 2023-03-28 NOTE — Progress Notes (Unsigned)
Subjective:  Patient ID: Alexis James, female    DOB: [birth year]  Age: 72 y.o. MRN: [id number]  CC: There were no encounter diagnoses.   HPI Alexis James presents for  Chief Complaint  Patient presents with   Medical Management of Chronic Issues    1) Hiatal  hernia    Outpatient Medications Prior to Visit  Medication Sig Dispense Refill   Calcium 500-2.5 MG-MCG CHEW Chew 1 each by mouth daily.     Cyanocobalamin (VITAMIN B-12 PO) Take 5,000 mcg by mouth daily.     escitalopram (LEXAPRO) 10 MG tablet Take 1 tablet (10 mg total) by mouth daily. 90 tablet 1   pantoprazole (PROTONIX) 40 MG tablet Take 1 tablet (40 mg total) by mouth daily. 30 tablet 5   rosuvastatin (CRESTOR) 10 MG tablet Take 1 tablet by mouth once daily 90 tablet 0   solifenacin (VESICARE) 5 MG tablet Take 1 tablet by mouth once daily 90 tablet 0   furosemide (LASIX) 20 MG tablet Take 1 tablet (20 mg total) by mouth daily. As needed for fluid retention (Patient not taking: Reported on 01/17/2023) 15 tablet 1   Calcium Carbonate-Vitamin D3 600-400 MG-UNIT TABS Take 1 tablet by mouth daily. (Patient not taking: Reported on 03/28/2023)     cholecalciferol (VITAMIN D) 25 MCG (1000 UNIT) tablet Take 1,000 Units by mouth daily. (Patient not taking: Reported on 03/28/2023)     Cyanocobalamin (B-12) 100 MCG TABS Take 100 mcg by mouth. (Patient not taking: Reported on 03/28/2023)     No facility-administered medications prior to visit.    Review of Systems;  Patient denies headache, fevers, malaise, unintentional weight loss, skin rash, eye pain, sinus congestion and sinus pain, sore throat, dysphagia,  hemoptysis , cough, dyspnea, wheezing, chest pain, palpitations, orthopnea, edema, abdominal pain, nausea, melena, diarrhea, constipation, flank pain, dysuria, hematuria, urinary  Frequency, nocturia, numbness, tingling, seizures,  Focal weakness, Loss of consciousness,  Tremor, insomnia, depression, anxiety, and suicidal  ideation.      Objective:  BP 112/62   Pulse 73   Temp 97.8 F (36.6 C) (Oral)   Ht 5' 2.5" (1.588 m)   Wt 137 lb 9.6 oz (62.4 kg)   SpO2 96%   BMI 24.77 kg/m   BP Readings from Last 3 Encounters:  03/28/23 112/62  01/20/23 122/61  01/06/23 128/73    Wt Readings from Last 3 Encounters:  03/28/23 137 lb 9.6 oz (62.4 kg)  01/20/23 134 lb 3.2 oz (60.9 kg)  01/06/23 135 lb (61.2 kg)    Physical Exam  Lab Results  Component Value Date   HGBA1C 6.1 11/26/2022   HGBA1C 6.2 02/16/2022   HGBA1C 6.2 08/05/2021    Lab Results  Component Value Date   CREATININE 0.74 11/26/2022   CREATININE 0.79 05/21/2022   CREATININE 0.79 02/16/2022    Lab Results  Component Value Date   WBC 3.3 (L) 05/21/2022   HGB 12.9 05/21/2022   HCT 38.0 05/21/2022   PLT 272.0 05/21/2022   GLUCOSE 87 11/26/2022   CHOL 162 11/26/2022   TRIG 85.0 11/26/2022   HDL 59.70 11/26/2022   LDLDIRECT 92.0 05/21/2022   LDLCALC 85 11/26/2022   ALT 18 11/26/2022   AST 22 11/26/2022   NA 142 11/26/2022   K 4.4 11/26/2022   CL 104 11/26/2022   CREATININE 0.74 11/26/2022   BUN 17 11/26/2022   CO2 29 11/26/2022   TSH 4.40 02/16/2022   HGBA1C 6.1 11/26/2022  MICROALBUR <0.7 05/21/2022    No results found.  Assessment & Plan:  .There are no diagnoses linked to this encounter.   I provided 30 minutes of face-to-face time during this encounter reviewing patient's last visit with me, patient's  most recent visit with cardiology,  nephrology,  and neurology,  recent surgical and non surgical procedures, previous  labs and imaging studies, counseling on currently addressed issues,  and post visit ordering to diagnostics and therapeutics .   Follow-up: No follow-ups on file.   Sherlene Shams, MD

## 2023-03-29 NOTE — Assessment & Plan Note (Signed)
S/p dilation by Midge Minium Feb 2024 EGD

## 2023-03-29 NOTE — Assessment & Plan Note (Signed)
Diagnosis explained  lifestyle modifications outlined.

## 2023-03-29 NOTE — Assessment & Plan Note (Signed)
Tolerating  Crestor 10 mg daily ;  LDL is < 100 Lab Results  Component Value Date   CHOL 162 11/26/2022   HDL 59.70 11/26/2022   LDLCALC 85 11/26/2022   LDLDIRECT 92.0 05/21/2022   TRIG 85.0 11/26/2022   CHOLHDL 3 11/26/2022   Tolerating  Crestor 10 mg daily  Lab Results  Component Value Date   CHOL 162 11/26/2022   HDL 59.70 11/26/2022   LDLCALC 85 11/26/2022   LDLDIRECT 92.0 05/21/2022   TRIG 85.0 11/26/2022   CHOLHDL 3 11/26/2022

## 2023-03-29 NOTE — Assessment & Plan Note (Signed)
She was advised to resume use of Debrox and follow up with ENT

## 2023-03-29 NOTE — Assessment & Plan Note (Signed)
Secondary to benign stricture,  s.p successful dilation during EGD  Feb 2024.  Advised to continue lifelong use of PPI

## 2023-03-30 DIAGNOSIS — C44619 Basal cell carcinoma of skin of left upper limb, including shoulder: Secondary | ICD-10-CM | POA: Diagnosis not present

## 2023-04-18 ENCOUNTER — Telehealth: Payer: Self-pay

## 2023-04-18 NOTE — Telephone Encounter (Signed)
Pt left vm to schedule colonscopy please return call

## 2023-04-18 NOTE — Telephone Encounter (Addendum)
This is an error. Patient left a message to schedule for her husband who was also next to her during the call.

## 2023-05-17 ENCOUNTER — Other Ambulatory Visit (INDEPENDENT_AMBULATORY_CARE_PROVIDER_SITE_OTHER): Payer: PPO

## 2023-05-17 DIAGNOSIS — E559 Vitamin D deficiency, unspecified: Secondary | ICD-10-CM | POA: Diagnosis not present

## 2023-05-17 DIAGNOSIS — R7303 Prediabetes: Secondary | ICD-10-CM | POA: Diagnosis not present

## 2023-05-17 DIAGNOSIS — E782 Mixed hyperlipidemia: Secondary | ICD-10-CM

## 2023-05-17 DIAGNOSIS — E538 Deficiency of other specified B group vitamins: Secondary | ICD-10-CM | POA: Diagnosis not present

## 2023-05-17 LAB — LIPID PANEL
Cholesterol: 167 mg/dL (ref 0–200)
HDL: 57.5 mg/dL (ref >39.00)
LDL Cholesterol: 91 mg/dL (ref 0–99)
NonHDL: 109.92
Total CHOL/HDL Ratio: 3
Triglycerides: 96 mg/dL (ref 0.0–149.0)
VLDL: 19.2 mg/dL (ref 0.0–40.0)

## 2023-05-17 LAB — COMPREHENSIVE METABOLIC PANEL
ALT: 17 U/L (ref 0–35)
AST: 24 U/L (ref 0–37)
Albumin: 4 g/dL (ref 3.5–5.2)
Alkaline Phosphatase: 65 U/L (ref 39–117)
BUN: 18 mg/dL (ref 6–23)
CO2: 32 mEq/L (ref 19–32)
Calcium: 9.1 mg/dL (ref 8.4–10.5)
Chloride: 103 mEq/L (ref 96–112)
Creatinine, Ser: 0.79 mg/dL (ref 0.40–1.20)
GFR: 74.43 mL/min (ref >60.00)
Glucose, Bld: 92 mg/dL (ref 70–99)
Potassium: 3.9 mEq/L (ref 3.5–5.1)
Sodium: 141 mEq/L (ref 135–145)
Total Bilirubin: 0.4 mg/dL (ref 0.2–1.2)
Total Protein: 6.6 g/dL (ref 6.0–8.3)

## 2023-05-17 LAB — VITAMIN B12: Vitamin B-12: >1500 pg/mL — ABNORMAL HIGH (ref 211–911)

## 2023-05-17 LAB — HEMOGLOBIN A1C: Hgb A1c MFr Bld: 6.1 % (ref 4.6–6.5)

## 2023-05-17 LAB — TSH: TSH: 4.2 u[IU]/mL (ref 0.35–5.50)

## 2023-05-17 LAB — VITAMIN D 25 HYDROXY (VIT D DEFICIENCY, FRACTURES): VITD: 43.56 ng/mL (ref 30.00–100.00)

## 2023-08-09 DIAGNOSIS — H52223 Regular astigmatism, bilateral: Secondary | ICD-10-CM | POA: Diagnosis not present

## 2023-08-09 DIAGNOSIS — H26493 Other secondary cataract, bilateral: Secondary | ICD-10-CM | POA: Diagnosis not present

## 2023-08-09 DIAGNOSIS — Z135 Encounter for screening for eye and ear disorders: Secondary | ICD-10-CM | POA: Diagnosis not present

## 2023-08-09 DIAGNOSIS — H524 Presbyopia: Secondary | ICD-10-CM | POA: Diagnosis not present

## 2023-08-09 DIAGNOSIS — H5203 Hypermetropia, bilateral: Secondary | ICD-10-CM | POA: Diagnosis not present

## 2023-08-09 DIAGNOSIS — Z961 Presence of intraocular lens: Secondary | ICD-10-CM | POA: Diagnosis not present

## 2023-09-16 ENCOUNTER — Other Ambulatory Visit: Payer: Self-pay | Admitting: Internal Medicine

## 2023-09-23 DIAGNOSIS — H43813 Vitreous degeneration, bilateral: Secondary | ICD-10-CM | POA: Diagnosis not present

## 2023-09-23 DIAGNOSIS — H26491 Other secondary cataract, right eye: Secondary | ICD-10-CM | POA: Diagnosis not present

## 2023-09-23 DIAGNOSIS — E119 Type 2 diabetes mellitus without complications: Secondary | ICD-10-CM | POA: Diagnosis not present

## 2023-09-28 ENCOUNTER — Telehealth: Payer: Self-pay | Admitting: Internal Medicine

## 2023-09-28 DIAGNOSIS — R7303 Prediabetes: Secondary | ICD-10-CM

## 2023-09-28 NOTE — Telephone Encounter (Signed)
Pt want blood work before her 1023 appointment for a cpe

## 2023-09-29 DIAGNOSIS — D2272 Melanocytic nevi of left lower limb, including hip: Secondary | ICD-10-CM | POA: Diagnosis not present

## 2023-09-29 DIAGNOSIS — L570 Actinic keratosis: Secondary | ICD-10-CM | POA: Diagnosis not present

## 2023-09-29 DIAGNOSIS — I788 Other diseases of capillaries: Secondary | ICD-10-CM | POA: Diagnosis not present

## 2023-09-29 DIAGNOSIS — D2271 Melanocytic nevi of right lower limb, including hip: Secondary | ICD-10-CM | POA: Diagnosis not present

## 2023-09-29 DIAGNOSIS — D225 Melanocytic nevi of trunk: Secondary | ICD-10-CM | POA: Diagnosis not present

## 2023-09-29 DIAGNOSIS — Z85828 Personal history of other malignant neoplasm of skin: Secondary | ICD-10-CM | POA: Diagnosis not present

## 2023-09-29 DIAGNOSIS — L821 Other seborrheic keratosis: Secondary | ICD-10-CM | POA: Diagnosis not present

## 2023-09-29 DIAGNOSIS — D2261 Melanocytic nevi of right upper limb, including shoulder: Secondary | ICD-10-CM | POA: Diagnosis not present

## 2023-09-29 DIAGNOSIS — D2262 Melanocytic nevi of left upper limb, including shoulder: Secondary | ICD-10-CM | POA: Diagnosis not present

## 2023-09-30 ENCOUNTER — Other Ambulatory Visit: Payer: Self-pay | Admitting: Internal Medicine

## 2023-09-30 DIAGNOSIS — Z1231 Encounter for screening mammogram for malignant neoplasm of breast: Secondary | ICD-10-CM

## 2023-09-30 NOTE — Telephone Encounter (Signed)
I have pended labs for approval.

## 2023-10-03 ENCOUNTER — Ambulatory Visit
Admission: RE | Admit: 2023-10-03 | Discharge: 2023-10-03 | Disposition: A | Discharge status: Home / Self Care | Payer: PPO | Source: Ambulatory Visit | Attending: Internal Medicine | Admitting: Internal Medicine

## 2023-10-03 DIAGNOSIS — Z1231 Encounter for screening mammogram for malignant neoplasm of breast: Secondary | ICD-10-CM | POA: Insufficient documentation

## 2023-10-03 NOTE — Addendum Note (Signed)
Addended by: Sherlene Shams on: 10/03/2023 12:40 PM   Modules accepted: Orders

## 2023-10-05 ENCOUNTER — Ambulatory Visit: Payer: PPO | Admitting: Internal Medicine

## 2023-10-05 ENCOUNTER — Encounter: Payer: Self-pay | Admitting: Internal Medicine

## 2023-10-05 VITALS — BP 118/60 | HR 69 | Ht 62.5 in | Wt 137.4 lb

## 2023-10-05 DIAGNOSIS — M85859 Other specified disorders of bone density and structure, unspecified thigh: Secondary | ICD-10-CM | POA: Diagnosis not present

## 2023-10-05 DIAGNOSIS — J479 Bronchiectasis, uncomplicated: Secondary | ICD-10-CM

## 2023-10-05 DIAGNOSIS — R911 Solitary pulmonary nodule: Secondary | ICD-10-CM | POA: Diagnosis not present

## 2023-10-05 DIAGNOSIS — E559 Vitamin D deficiency, unspecified: Secondary | ICD-10-CM

## 2023-10-05 DIAGNOSIS — R351 Nocturia: Secondary | ICD-10-CM | POA: Diagnosis not present

## 2023-10-05 DIAGNOSIS — N83201 Unspecified ovarian cyst, right side: Secondary | ICD-10-CM | POA: Diagnosis not present

## 2023-10-05 DIAGNOSIS — I700 Atherosclerosis of aorta: Secondary | ICD-10-CM | POA: Diagnosis not present

## 2023-10-05 DIAGNOSIS — Z0000 Encounter for general adult medical examination without abnormal findings: Secondary | ICD-10-CM | POA: Diagnosis not present

## 2023-10-05 DIAGNOSIS — R7303 Prediabetes: Secondary | ICD-10-CM

## 2023-10-05 DIAGNOSIS — D709 Neutropenia, unspecified: Secondary | ICD-10-CM

## 2023-10-05 LAB — COMPREHENSIVE METABOLIC PANEL
ALT: 15 U/L (ref 0–35)
AST: 20 U/L (ref 0–37)
Albumin: 3.9 g/dL (ref 3.5–5.2)
Alkaline Phosphatase: 68 U/L (ref 39–117)
BUN: 20 mg/dL (ref 6–23)
CO2: 30 meq/L (ref 19–32)
Calcium: 9.2 mg/dL (ref 8.4–10.5)
Chloride: 104 meq/L (ref 96–112)
Creatinine, Ser: 0.78 mg/dL (ref 0.40–1.20)
GFR: 75.37 mL/min (ref >60.00)
Glucose, Bld: 95 mg/dL (ref 70–99)
Potassium: 4.9 meq/L (ref 3.5–5.1)
Sodium: 140 meq/L (ref 135–145)
Total Bilirubin: 0.4 mg/dL (ref 0.2–1.2)
Total Protein: 6.1 g/dL (ref 6.0–8.3)

## 2023-10-05 LAB — CBC WITH DIFFERENTIAL/PLATELET
Basophils Absolute: 0 10*3/uL (ref 0.0–0.1)
Basophils Relative: 0.6 % (ref 0.0–3.0)
Eosinophils Absolute: 0.1 10*3/uL (ref 0.0–0.7)
Eosinophils Relative: 2.8 % (ref 0.0–5.0)
HCT: 38.1 % (ref 36.0–46.0)
Hemoglobin: 12.4 g/dL (ref 12.0–15.0)
Lymphocytes Relative: 23.6 % (ref 12.0–46.0)
Lymphs Abs: 0.9 10*3/uL (ref 0.7–4.0)
MCHC: 32.7 g/dL (ref 30.0–36.0)
MCV: 97.2 fL (ref 78.0–100.0)
Monocytes Absolute: 0.3 10*3/uL (ref 0.1–1.0)
Monocytes Relative: 7.8 % (ref 3.0–12.0)
Neutro Abs: 2.6 10*3/uL (ref 1.4–7.7)
Neutrophils Relative %: 65.2 % (ref 43.0–77.0)
Platelets: 283 10*3/uL (ref 150.0–400.0)
RBC: 3.92 Mil/uL (ref 3.87–5.11)
RDW: 14.6 % (ref 11.5–15.5)
WBC: 3.9 10*3/uL — ABNORMAL LOW (ref 4.0–10.5)

## 2023-10-05 LAB — HEMOGLOBIN A1C: Hgb A1c MFr Bld: 6.2 % (ref 4.6–6.5)

## 2023-10-05 LAB — VITAMIN D 25 HYDROXY (VIT D DEFICIENCY, FRACTURES): VITD: 35.4 ng/mL (ref 30.00–100.00)

## 2023-10-05 MED ORDER — ESCITALOPRAM OXALATE 10 MG PO TABS: 10.0000 mg | ORAL_TABLET | Freq: Every day | ORAL | 1 refills | Status: DC

## 2023-10-05 MED ORDER — SOLIFENACIN SUCCINATE 5 MG PO TABS: 5.0000 mg | ORAL_TABLET | Freq: Every day | ORAL | 1 refills | Status: DC

## 2023-10-05 MED ORDER — FUROSEMIDE 20 MG PO TABS: 20.0000 mg | ORAL_TABLET | Freq: Every day | ORAL | 1 refills | Status: AC

## 2023-10-05 NOTE — Assessment & Plan Note (Signed)
Tolerating  Crestor 10 mg daily ;  LDL is < 100 Lab Results  Component Value Date   CHOL 167 05/17/2023   HDL 57.50 05/17/2023   LDLCALC 91 05/17/2023   LDLDIRECT 92.0 05/21/2022   TRIG 96.0 05/17/2023   CHOLHDL 3 05/17/2023

## 2023-10-05 NOTE — Assessment & Plan Note (Signed)
Mild, by 2021 DEXA repeat in 2025

## 2023-10-05 NOTE — Assessment & Plan Note (Signed)

## 2023-10-05 NOTE — Assessment & Plan Note (Signed)
Benign appearing by up close evaluation with pelvic ultrasoudn and CA 125  in 2021

## 2023-10-05 NOTE — Assessment & Plan Note (Signed)
No recurrent infections.  Discussed.  Continue annual checks  Lab Results  Component Value Date   WBC 3.9 (L) 10/05/2023   HGB 12.4 10/05/2023   HCT 38.1 10/05/2023   MCV 97.2 10/05/2023   PLT 283.0 10/05/2023

## 2023-10-05 NOTE — Assessment & Plan Note (Signed)
Reduced to twice nightly with vesicare

## 2023-10-05 NOTE — Progress Notes (Signed)
Patient ID: Alexis James, female    DOB: June 27, 1951  Age: 72 y.o. MRN: [id number]  The patient is here for annual preventive examination and management of other chronic and acute problems.   The risk factors are reflected in the social history.   The roster of all physicians providing medical care to patient - is listed in the Snapshot section of the chart.   Activities of daily living:  The patient is 100% independent in all ADLs: dressing, toileting, feeding as well as independent mobility   Home safety : The patient has smoke detectors in the home. They wear seatbelts.  There are no unsecured firearms at home. There is no violence in the home.    There is no risks for hepatitis, STDs or HIV. There is no   history of blood transfusion. They have no travel history to infectious disease endemic areas of the world.   The patient has seen their dentist in the last six month. They have seen their eye doctor in the last year. The patinet  denies slight hearing difficulty with regard to whispered voices and some television programs.  They have deferred audiologic testing in the last year.  They do not  have excessive sun exposure. Discussed the need for sun protection: hats, long sleeves and use of sunscreen if there is significant sun exposure.    Diet: the importance of a healthy diet is discussed. They do have a healthy diet.   The benefits of regular aerobic exercise were discussed. The patient  exercises  3  days per week  for  30 minutes.    Depression screen: there are no signs or vegative symptoms of depression- irritability, change in appetite, anhedonia, sadness/tearfullness.   The following portions of the patient's history were reviewed and updated as appropriate: allergies, current medications, past family history, past medical history,  past surgical history, past social history  and problem list.   Visual acuity was not assessed per patient preference since the patient has regular  follow up with an  ophthalmologist. Hearing and body mass index were assessed and reviewed.    During the course of the visit the patient was educated and counseled about appropriate screening and preventive services including : fall prevention , diabetes screening, nutrition counseling, colorectal cancer screening, and recommended immunizations.    Chief Complaint: None    Review of Symptoms  Patient denies headache, fevers, malaise, unintentional weight loss, skin rash, eye pain, sinus congestion and sinus pain, sore throat, dysphagia,  hemoptysis , cough, dyspnea, wheezing, chest pain, palpitations, orthopnea, edema, abdominal pain, nausea, melena, diarrhea, constipation, flank pain, dysuria, hematuria, urinary  Frequency, nocturia, numbness, tingling, seizures,  Focal weakness, Loss of consciousness,  Tremor, insomnia, depression, anxiety, and suicidal ideation.    Physical Exam:  BP 118/60   Pulse 69   Ht 5' 2.5" (1.588 m)   Wt 137 lb 6.4 oz (62.3 kg)   SpO2 95%   BMI 24.73 kg/m    Physical Exam Vitals reviewed.  Constitutional:      General: She is not in acute distress.    Appearance: Normal appearance. She is normal weight. She is not ill-appearing, toxic-appearing or diaphoretic.  HENT:     Head: Normocephalic.  Eyes:     General: No scleral icterus.       Right eye: No discharge.        Left eye: No discharge.     Conjunctiva/sclera: Conjunctivae normal.  Cardiovascular:     Rate  and Rhythm: Normal rate and regular rhythm.     Heart sounds: Normal heart sounds.  Pulmonary:     Effort: Pulmonary effort is normal. No respiratory distress.     Breath sounds: Normal breath sounds.  Musculoskeletal:        General: Normal range of motion.  Skin:    General: Skin is warm and dry.  Neurological:     General: No focal deficit present.     Mental Status: She is alert and oriented to person, place, and time. Mental status is at baseline.  Psychiatric:        Mood and  Affect: Mood normal.        Behavior: Behavior normal.        Thought Content: Thought content normal.        Judgment: Judgment normal.    Assessment and Plan: Encounter for preventive health examination Assessment & Plan: age appropriate education and counseling updated, referrals for preventative services and immunizations addressed, dietary and smoking counseling addressed, most recent labs reviewed.  I have personally reviewed and have noted:   1) the patient's medical and social history 2) The pt's use of alcohol, tobacco, and illicit drugs 3) The patient's current medications and supplements 4) Functional ability including ADL's, fall risk, home safety risk, hearing and visual impairment 5) Diet and physical activities 6) Evidence for depression or mood disorder 7) The patient's height, weight, and BMI have been recorded in the chart  I have made referrals, and provided counseling and education based on review of the above    Osteopenia of hip, unspecified laterality Assessment & Plan: Mild, by 2021 DEXA repeat in 2025   Prediabetes -     Comprehensive metabolic panel -     Hemoglobin A1c  Vitamin D deficiency -     VITAMIN D 25 Hydroxy (Vit-D Deficiency, Fractures)  Cyst of right ovary Assessment & Plan: Benign appearing by up close evaluation with pelvic ultrasoudn and CA 125  in 2021    Nocturia more than twice per night Assessment & Plan: Reduced to twice nightly with vesicare    Neutropenia, unspecified type (HCC) Assessment & Plan: No recurrent infections.  Discussed.  Continue annual checks  Lab Results  Component Value Date   WBC 3.9 (L) 10/05/2023   HGB 12.4 10/05/2023   HCT 38.1 10/05/2023   MCV 97.2 10/05/2023   PLT 283.0 10/05/2023     Orders: -     CBC with Differential/Platelet  Bronchiectasis without complication (HCC) Assessment & Plan: She has no purulent sputum or productive cough currently.  Recommend RSV vaccine    Nodule of  right lung Assessment & Plan: Incidental findings on July 2020 CT scan.  Unchanged 3 mm nodule  by follow up CT Dec 2020 and  De 202c    Aortic atherosclerosis [facility] Assessment & Plan: Tolerating  Crestor 10 mg daily ;  LDL is < 100 Lab Results  Component Value Date   CHOL 167 05/17/2023   HDL 57.50 05/17/2023   LDLCALC 91 05/17/2023   LDLDIRECT 92.0 05/21/2022   TRIG 96.0 05/17/2023   CHOLHDL 3 05/17/2023     Other orders -     Escitalopram Oxalate; Take 1 tablet (10 mg total) by mouth daily.  Dispense: 90 tablet; Refill: 1 -     Solifenacin Succinate; Take 1 tablet (5 mg total) by mouth daily.  Dispense: 90 tablet; Refill: 1 -     Furosemide; Take 1 tablet (20 mg total)  by mouth daily. As needed for fluid retention  Dispense: 45 tablet; Refill: 1    Return in about 6 months (around 04/04/2024).  Sherlene Shams, MD

## 2023-10-05 NOTE — Assessment & Plan Note (Signed)
Incidental findings on July 2020 CT scan.  Unchanged 3 mm nodule  by follow up CT Dec 2020 and  De 202c

## 2023-10-05 NOTE — Assessment & Plan Note (Signed)
She has no purulent sputum or productive cough currently.  Recommend RSV vaccine

## 2023-10-05 NOTE — Patient Instructions (Addendum)
RSV vaccine is more important for you than the hep A and B  vaccine because of your lung condition (bronchiectasis)  Medicare will not pay for either unless you get them at your pharmacy  Vaginal estrogen can be applied as an ointment to increase lubrication and make sex more comfortable . Let me know if you want to try it.    You might want to try using Relaxium for insomnia  (as seen on TV commercials) . It is available through [employer] and contains all natural supplements:  Melatonin 5 mg  Chamomile 25 mg Passionflower extract 75 mg GABA 100 mg Ashwaganda extract 125 mg Magnesium citrate, glycinate, oxide (100 mg)  L tryptophan 500 mg Valerest (proprietary  ingredient ; probably valeria root extract)

## 2023-10-06 ENCOUNTER — Encounter: Payer: Self-pay | Admitting: Internal Medicine

## 2023-10-14 DIAGNOSIS — H26492 Other secondary cataract, left eye: Secondary | ICD-10-CM | POA: Diagnosis not present

## 2024-03-10 ENCOUNTER — Other Ambulatory Visit: Payer: Self-pay | Admitting: Internal Medicine

## 2024-05-04 ENCOUNTER — Other Ambulatory Visit: Payer: Self-pay | Admitting: Internal Medicine

## 2024-06-07 ENCOUNTER — Other Ambulatory Visit: Payer: Self-pay | Admitting: Internal Medicine

## 2024-08-04 ENCOUNTER — Other Ambulatory Visit: Payer: Self-pay | Admitting: Internal Medicine

## 2024-09-01 ENCOUNTER — Other Ambulatory Visit: Payer: Self-pay | Admitting: Internal Medicine

## 2024-09-18 NOTE — Progress Notes (Signed)
 KAPRI NERO                                          MRN: [id number]   09/18/2024   The [facility] Quality Team Specialist reviewed this patient medical record for the purposes of chart review for care gap closure. The following were reviewed: chart review for care gap closure-kidney health evaluation for diabetes:eGFR  and uACR.    [facility] Quality Team

## 2024-10-04 DIAGNOSIS — D485 Neoplasm of uncertain behavior of skin: Secondary | ICD-10-CM | POA: Diagnosis not present

## 2024-10-04 DIAGNOSIS — Z85828 Personal history of other malignant neoplasm of skin: Secondary | ICD-10-CM | POA: Diagnosis not present

## 2024-10-04 DIAGNOSIS — C4441 Basal cell carcinoma of skin of scalp and neck: Secondary | ICD-10-CM | POA: Diagnosis not present

## 2024-10-04 DIAGNOSIS — L608 Other nail disorders: Secondary | ICD-10-CM | POA: Diagnosis not present

## 2024-10-04 DIAGNOSIS — Z08 Encounter for follow-up examination after completed treatment for malignant neoplasm: Secondary | ICD-10-CM | POA: Diagnosis not present

## 2024-10-04 DIAGNOSIS — L570 Actinic keratosis: Secondary | ICD-10-CM | POA: Diagnosis not present

## 2024-10-04 DIAGNOSIS — L820 Inflamed seborrheic keratosis: Secondary | ICD-10-CM | POA: Diagnosis not present

## 2024-10-04 DIAGNOSIS — D1801 Hemangioma of skin and subcutaneous tissue: Secondary | ICD-10-CM | POA: Diagnosis not present

## 2024-10-04 DIAGNOSIS — L538 Other specified erythematous conditions: Secondary | ICD-10-CM | POA: Diagnosis not present

## 2024-10-11 ENCOUNTER — Other Ambulatory Visit: Payer: Self-pay | Admitting: Internal Medicine

## 2024-10-11 DIAGNOSIS — Z1231 Encounter for screening mammogram for malignant neoplasm of breast: Secondary | ICD-10-CM

## 2024-10-16 ENCOUNTER — Ambulatory Visit: Admitting: Internal Medicine

## 2024-10-16 ENCOUNTER — Encounter: Payer: Self-pay | Admitting: Internal Medicine

## 2024-10-16 VITALS — BP 124/66 | HR 85 | Ht 62.5 in | Wt 139.0 lb

## 2024-10-16 DIAGNOSIS — Z8719 Personal history of other diseases of the digestive system: Secondary | ICD-10-CM

## 2024-10-16 DIAGNOSIS — J479 Bronchiectasis, uncomplicated: Secondary | ICD-10-CM | POA: Diagnosis not present

## 2024-10-16 DIAGNOSIS — D126 Benign neoplasm of colon, unspecified: Secondary | ICD-10-CM | POA: Diagnosis not present

## 2024-10-16 DIAGNOSIS — R030 Elevated blood-pressure reading, without diagnosis of hypertension: Secondary | ICD-10-CM | POA: Diagnosis not present

## 2024-10-16 DIAGNOSIS — Z0000 Encounter for general adult medical examination without abnormal findings: Secondary | ICD-10-CM | POA: Diagnosis not present

## 2024-10-16 DIAGNOSIS — R7303 Prediabetes: Secondary | ICD-10-CM

## 2024-10-16 DIAGNOSIS — E782 Mixed hyperlipidemia: Secondary | ICD-10-CM | POA: Diagnosis not present

## 2024-10-16 DIAGNOSIS — R5383 Other fatigue: Secondary | ICD-10-CM

## 2024-10-16 DIAGNOSIS — I700 Atherosclerosis of aorta: Secondary | ICD-10-CM

## 2024-10-16 DIAGNOSIS — R944 Abnormal results of kidney function studies: Secondary | ICD-10-CM

## 2024-10-16 DIAGNOSIS — M85859 Other specified disorders of bone density and structure, unspecified thigh: Secondary | ICD-10-CM | POA: Diagnosis not present

## 2024-10-16 LAB — COMPREHENSIVE METABOLIC PANEL WITH GFR
ALT: 13 U/L (ref 0–35)
AST: 17 U/L (ref 0–37)
Albumin: 4.2 g/dL (ref 3.5–5.2)
Alkaline Phosphatase: 67 U/L (ref 39–117)
BUN: 19 mg/dL (ref 6–23)
CO2: 30 meq/L (ref 19–32)
Calcium: 9.4 mg/dL (ref 8.4–10.5)
Chloride: 102 meq/L (ref 96–112)
Creatinine, Ser: 0.95 mg/dL (ref 0.40–1.20)
GFR: 59.06 mL/min — ABNORMAL LOW (ref >60.00)
Glucose, Bld: 82 mg/dL (ref 70–99)
Potassium: 4 meq/L (ref 3.5–5.1)
Sodium: 140 meq/L (ref 135–145)
Total Bilirubin: 0.4 mg/dL (ref 0.2–1.2)
Total Protein: 6.5 g/dL (ref 6.0–8.3)

## 2024-10-16 LAB — HEMOGLOBIN A1C: Hgb A1c MFr Bld: 6.4 % (ref 4.6–6.5)

## 2024-10-16 LAB — CBC WITH DIFFERENTIAL/PLATELET
Basophils Absolute: 0 K/uL (ref 0.0–0.1)
Basophils Relative: 0.9 % (ref 0.0–3.0)
Eosinophils Absolute: 0.1 K/uL (ref 0.0–0.7)
Eosinophils Relative: 2.9 % (ref 0.0–5.0)
HCT: 38.2 % (ref 36.0–46.0)
Hemoglobin: 12.8 g/dL (ref 12.0–15.0)
Lymphocytes Relative: 23.9 % (ref 12.0–46.0)
Lymphs Abs: 0.9 K/uL (ref 0.7–4.0)
MCHC: 33.6 g/dL (ref 30.0–36.0)
MCV: 95.4 fl (ref 78.0–100.0)
Monocytes Absolute: 0.3 K/uL (ref 0.1–1.0)
Monocytes Relative: 8.5 % (ref 3.0–12.0)
Neutro Abs: 2.5 K/uL (ref 1.4–7.7)
Neutrophils Relative %: 63.8 % (ref 43.0–77.0)
Platelets: 262 K/uL (ref 150.0–400.0)
RBC: 4 Mil/uL (ref 3.87–5.11)
RDW: 14.1 % (ref 11.5–15.5)
WBC: 3.9 K/uL — ABNORMAL LOW (ref 4.0–10.5)

## 2024-10-16 LAB — LIPID PANEL
Cholesterol: 163 mg/dL (ref 0–200)
HDL: 58 mg/dL (ref >39.00)
LDL Cholesterol: 73 mg/dL (ref 0–99)
NonHDL: 104.56
Total CHOL/HDL Ratio: 3
Triglycerides: 158 mg/dL — ABNORMAL HIGH (ref 0.0–149.0)
VLDL: 31.6 mg/dL (ref 0.0–40.0)

## 2024-10-16 LAB — LDL CHOLESTEROL, DIRECT: Direct LDL: 69 mg/dL

## 2024-10-16 MED ORDER — PANTOPRAZOLE SODIUM 40 MG PO TBEC: 40.0000 mg | DELAYED_RELEASE_TABLET | Freq: Every day | ORAL | 1 refills | Status: AC

## 2024-10-16 MED ORDER — SOLIFENACIN SUCCINATE 5 MG PO TABS: 5.0000 mg | ORAL_TABLET | Freq: Every day | ORAL | 1 refills | Status: DC

## 2024-10-16 MED ORDER — ESCITALOPRAM OXALATE 10 MG PO TABS: 10.0000 mg | ORAL_TABLET | Freq: Every day | ORAL | 1 refills | Status: AC

## 2024-10-16 MED ORDER — ROSUVASTATIN CALCIUM 10 MG PO TABS: 10.0000 mg | ORAL_TABLET | Freq: Every day | ORAL | 1 refills | Status: AC

## 2024-10-16 NOTE — Assessment & Plan Note (Signed)
 Found because of a positive cologuard test in in 2022. Repeat  colonoscopy due in 2027

## 2024-10-16 NOTE — Assessment & Plan Note (Signed)
No history

## 2024-10-16 NOTE — Progress Notes (Unsigned)
 Patient ID: Alexis James, female    DOB: [birth year]  Age: 73 y.o. MRN: [id number]  The patient is here for annual preventive examination and management of other chronic and acute problems.   The risk factors are reflected in the social history.   The roster of all physicians providing medical care to patient - is listed in the Snapshot section of the chart.   Activities of daily living:  The patient is 100% independent in all ADLs: dressing, toileting, feeding as well as independent mobility   Home safety : The patient has smoke detectors in the home. They wear seatbelts.  There are no unsecured firearms at home. There is no violence in the home.    There is no risks for hepatitis, STDs or HIV. There is no   history of blood transfusion. They have no travel history to infectious disease endemic areas of the world.   The patient has seen their dentist in the last six month. They have seen their eye doctor in the last year. The patinet  denies slight hearing difficulty with regard to whispered voices and some television programs.  They have deferred audiologic testing in the last year.  They do not  have excessive sun exposure. Discussed the need for sun protection: hats, long sleeves and use of sunscreen if there is significant sun exposure.    Diet: the importance of a healthy diet is discussed. They do have a healthy diet.   The benefits of regular aerobic exercise were discussed. The patient  has stopped exercising but plans to resume .    Depression screen: there are no signs or vegative symptoms of depression- irritability, change in appetite, anhedonia, sadness/tearfullness.   The following portions of the patient's history were reviewed and updated as appropriate: allergies, current medications, past family history, past medical history,  past surgical history, past social history  and problem list.   Visual acuity was not assessed per patient preference since the patient has regular  follow up with an  ophthalmologist. Hearing and body mass index were assessed and reviewed.    During the course of the visit the patient was educated and counseled about appropriate screening and preventive services including : fall prevention , diabetes screening, nutrition counseling, colorectal cancer screening, and recommended immunizations.    Chief Complaint:  Occasional constipation: using stool softener daily . Not drinking enough water.  Has a BM 3-4 times per week  Occasional insomnia managed with tylenol  pm or relaxium prn.  Not nightly .  Naps daily  for up to 1-2 hours.  Stays up late if she does take a long nap  No urinary incontinence      Review of Symptoms  Patient denies headache, fevers, malaise, unintentional weight loss, skin rash, eye pain, sinus congestion and sinus pain, sore throat, dysphagia,  hemoptysis , cough, dyspnea, wheezing, chest pain, palpitations, orthopnea, edema, abdominal pain, nausea, melena, diarrhea, flank pain, dysuria, hematuria, urinary  Frequency, nocturia, numbness, tingling, seizures,  Focal weakness, Loss of consciousness,  Tremor, insomnia, depression, anxiety, and suicidal ideation.    Physical Exam:  BP 124/66   Pulse 85   Ht 5' 2.5 (1.588 m)   Wt 139 lb (63 kg)   SpO2 95%   BMI 25.02 kg/m    Physical Exam Vitals reviewed.  Constitutional:      General: She is not in acute distress.    Appearance: Normal appearance. She is well-developed and normal weight. She is not ill-appearing, toxic-appearing or  diaphoretic.  HENT:     Head: Normocephalic.     Right Ear: Tympanic membrane, ear canal and external ear normal. There is no impacted cerumen.     Left Ear: Tympanic membrane, ear canal and external ear normal. There is no impacted cerumen.     Nose: Nose normal.     Mouth/Throat:     Mouth: Mucous membranes are moist.     Pharynx: Oropharynx is clear.  Eyes:     General: No scleral icterus.       Right eye: No discharge.         Left eye: No discharge.     Conjunctiva/sclera: Conjunctivae normal.     Pupils: Pupils are equal, round, and reactive to light.  Neck:     Thyroid : No thyromegaly.     Vascular: No carotid bruit or JVD.  Cardiovascular:     Rate and Rhythm: Normal rate and regular rhythm.     Heart sounds: Normal heart sounds.  Pulmonary:     Effort: Pulmonary effort is normal. No respiratory distress.     Breath sounds: Normal breath sounds.  Chest:  Breasts:    Breasts are symmetrical.     Right: Normal. No swelling, inverted nipple, mass, nipple discharge, skin change or tenderness.     Left: Normal. No swelling, inverted nipple, mass, nipple discharge, skin change or tenderness.  Abdominal:     General: Bowel sounds are normal.     Palpations: Abdomen is soft. There is no mass.     Tenderness: There is no abdominal tenderness. There is no guarding or rebound.  Musculoskeletal:        General: Normal range of motion.     Cervical back: Normal range of motion and neck supple.  Lymphadenopathy:     Cervical: No cervical adenopathy.     Upper Body:     Right upper body: No supraclavicular, axillary or pectoral adenopathy.     Left upper body: No supraclavicular, axillary or pectoral adenopathy.  Skin:    General: Skin is warm and dry.  Neurological:     General: No focal deficit present.     Mental Status: She is alert and oriented to person, place, and time. Mental status is at baseline.  Psychiatric:        Mood and Affect: Mood normal.        Behavior: Behavior normal.        Thought Content: Thought content normal.        Judgment: Judgment normal.     Assessment and Plan: Encounter for preventive health examination Assessment & Plan: age appropriate education and counseling updated, referrals for preventative services and immunizations addressed, dietary and smoking counseling addressed, most recent labs reviewed.  I have personally reviewed and have noted:   1) the  patient's medical and social history 2) The pt's use of alcohol, tobacco, and illicit drugs 3) The patient's current medications and supplements 4) Functional ability including ADL's, fall risk, home safety risk, hearing and visual impairment 5) Diet and physical activities 6) Evidence for depression or mood disorder 7) The patient's height, weight, and BMI have been recorded in the chart   I have made referrals, and provided counseling and education based on review of the above    Mixed hyperlipidemia Assessment & Plan: Managed with  high potency statin given presence of AA on CT scan . Repeat lipids and lfts are due   Lab Results  Component Value Date  CHOL 167 05/17/2023   HDL 57.50 05/17/2023   LDLCALC 91 05/17/2023   LDLDIRECT 92.0 05/21/2022   TRIG 96.0 05/17/2023   CHOLHDL 3 05/17/2023   Lab Results  Component Value Date   ALT 15 10/05/2023   AST 20 10/05/2023   ALKPHOS 68 10/05/2023   BILITOT 0.4 10/05/2023     Orders: -     Lipid panel -     LDL cholesterol, direct  Prediabetes Assessment & Plan: Her  random glucose is not  elevated but her A1c continues to suggest she is at increased  risk for developing diabetes.  I rhave reviewed her diet and lifestyle and encouraged her to follow a low glycemic index diet and participate regularly in an aerobic  exercise activity.   Repeat A1c is due  Lab Results  Component Value Date   HGBA1C 6.2 10/05/2023     Orders: -     Comprehensive metabolic panel with GFR -     Hemoglobin A1c  Other fatigue -     CBC with Differential/Platelet -     TSH  Tubular adenoma of colon Assessment & Plan: Found because of a positive cologuard test in in 2022. Repeat  colonoscopy due in 2027   Elevated blood pressure reading in office without diagnosis of hypertension Assessment & Plan: No history    Aortic atherosclerosis Assessment & Plan: Reviewed.  She is Tolerating  Crestor  10 mg daily ;  LDL is < 100. Repeat lfts  and lipids are due  Lab Results  Component Value Date   CHOL 167 05/17/2023   HDL 57.50 05/17/2023   LDLCALC 91 05/17/2023   LDLDIRECT 92.0 05/21/2022   TRIG 96.0 05/17/2023   CHOLHDL 3 05/17/2023     Bronchiectasis without complication (HCC) Assessment & Plan: She has no purulent sputum or productive cough currently.  Recommend RSV vaccine    History of esophageal stricture Assessment & Plan: Asymptomatic S/p dilation by Rogelia Copping Feb 2024 EGD . Advised to continue daily use of PPI   Osteopenia of hip, unspecified laterality Assessment & Plan: Mild, by 2021 DEXA ;  will repeat in 2026 .  Continue calcium  and vitmain d supplementation    Other orders -     Escitalopram  Oxalate; Take 1 tablet (10 mg total) by mouth daily.  Dispense: 90 tablet; Refill: 1 -     Pantoprazole  Sodium; Take 1 tablet (40 mg total) by mouth daily.  Dispense: 90 tablet; Refill: 1 -     Rosuvastatin  Calcium ; Take 1 tablet (10 mg total) by mouth daily.  Dispense: 90 tablet; Refill: 1 -     Solifenacin  Succinate; Take 1 tablet (5 mg total) by mouth daily.  Dispense: 90 tablet; Refill: 1    No follow-ups on file.  Alexis LITTIE Kettering, MD

## 2024-10-16 NOTE — Patient Instructions (Addendum)
 YOUR BLOOD PRESSURE was slightly HIGH TODAY  It should be around 130/80 or less.  Please check your blood pressure a few times at home and send me the readings so I can determine if we need to talk about medications .  You are not at risk for serious complications from RSV , but you should still be eligible for the  RSV vaccine,   based on your age  Get back to exercising regularly , both for your heart and your brain.   I highly recommend reading the End of Alzheimer's: The First Program to Prevent and Reverse Cognitive Decline  by Cheryl Cary, MD    Dr Maree AT [facility] clinic also has a website called [email or web address]

## 2024-10-17 LAB — TSH: TSH: 3.16 u[IU]/mL (ref 0.35–5.50)

## 2024-10-17 NOTE — Assessment & Plan Note (Signed)
 Managed with  high potency statin given presence of AA on CT scan . Repeat lipids and lfts are due   Lab Results  Component Value Date   CHOL 167 05/17/2023   HDL 57.50 05/17/2023   LDLCALC 91 05/17/2023   LDLDIRECT 92.0 05/21/2022   TRIG 96.0 05/17/2023   CHOLHDL 3 05/17/2023   Lab Results  Component Value Date   ALT 15 10/05/2023   AST 20 10/05/2023   ALKPHOS 68 10/05/2023   BILITOT 0.4 10/05/2023

## 2024-10-17 NOTE — Assessment & Plan Note (Addendum)
 Her  random glucose is not  elevated but her A1c continues to suggest she is at increased  risk for developing diabetes.  I rhave reviewed her diet and lifestyle and encouraged her to follow a low glycemic index diet and participate regularly in an aerobic  exercise activity.   Repeat A1c is due  Lab Results  Component Value Date   HGBA1C 6.2 10/05/2023

## 2024-10-17 NOTE — Assessment & Plan Note (Addendum)
 Mild, by 2021 DEXA ;  will repeat in 2026 .  Continue calcium  and vitmain d supplementation

## 2024-10-17 NOTE — Assessment & Plan Note (Signed)
She has no purulent sputum or productive cough currently.  Recommend RSV vaccine

## 2024-10-17 NOTE — Assessment & Plan Note (Addendum)
 Asymptomatic S/p dilation by Rogelia Copping Feb 2024 EGD . Advised to continue daily use of PPI

## 2024-10-17 NOTE — Assessment & Plan Note (Signed)

## 2024-10-17 NOTE — Assessment & Plan Note (Signed)
 Reviewed.  She is Tolerating  Crestor  10 mg daily ;  LDL is < 100. Repeat lfts and lipids are due  Lab Results  Component Value Date   CHOL 167 05/17/2023   HDL 57.50 05/17/2023   LDLCALC 91 05/17/2023   LDLDIRECT 92.0 05/21/2022   TRIG 96.0 05/17/2023   CHOLHDL 3 05/17/2023

## 2024-10-18 ENCOUNTER — Ambulatory Visit: Payer: Self-pay | Admitting: Internal Medicine

## 2024-10-18 NOTE — Addendum Note (Signed)
 Addended by: MARYLYNN VERNEITA CROME on: 10/18/2024 02:41 PM   Modules accepted: Orders

## 2024-11-01 DIAGNOSIS — C4441 Basal cell carcinoma of skin of scalp and neck: Secondary | ICD-10-CM | POA: Diagnosis not present

## 2024-11-07 DIAGNOSIS — H903 Sensorineural hearing loss, bilateral: Secondary | ICD-10-CM | POA: Diagnosis not present

## 2024-11-07 DIAGNOSIS — H6123 Impacted cerumen, bilateral: Secondary | ICD-10-CM | POA: Diagnosis not present

## 2024-11-12 DIAGNOSIS — E119 Type 2 diabetes mellitus without complications: Secondary | ICD-10-CM | POA: Diagnosis not present

## 2024-11-12 DIAGNOSIS — H43813 Vitreous degeneration, bilateral: Secondary | ICD-10-CM | POA: Diagnosis not present

## 2024-11-16 ENCOUNTER — Encounter

## 2024-11-19 ENCOUNTER — Ambulatory Visit
Admission: RE | Admit: 2024-11-19 | Discharge: 2024-11-19 | Disposition: A | Source: Ambulatory Visit | Attending: Internal Medicine | Admitting: Internal Medicine

## 2024-11-19 ENCOUNTER — Telehealth: Payer: Self-pay | Admitting: Internal Medicine

## 2024-11-19 ENCOUNTER — Other Ambulatory Visit (INDEPENDENT_AMBULATORY_CARE_PROVIDER_SITE_OTHER)

## 2024-11-19 DIAGNOSIS — Z1231 Encounter for screening mammogram for malignant neoplasm of breast: Secondary | ICD-10-CM | POA: Diagnosis not present

## 2024-11-19 DIAGNOSIS — R944 Abnormal results of kidney function studies: Secondary | ICD-10-CM

## 2024-11-19 LAB — BASIC METABOLIC PANEL WITH GFR
BUN: 14 mg/dL (ref 6–23)
CO2: 28 meq/L (ref 19–32)
Calcium: 9.1 mg/dL (ref 8.4–10.5)
Chloride: 102 meq/L (ref 96–112)
Creatinine, Ser: 0.66 mg/dL (ref 0.40–1.20)
GFR: 86.36 mL/min (ref >60.00)
Glucose, Bld: 92 mg/dL (ref 70–99)
Potassium: 4.1 meq/L (ref 3.5–5.1)
Sodium: 139 meq/L (ref 135–145)

## 2024-11-19 NOTE — Telephone Encounter (Signed)
 Left message for patient to give our office a call back to discuss Dr Marylynn recommendations.   OK to [id number] to give note if patient calls back. If relayed, please notify the office.

## 2024-11-19 NOTE — Telephone Encounter (Signed)
 Copied from CRM [id number]. Topic: General - Other >> Nov 19, 2024 11:42 AM Antony RAMAN wrote: Reason for CRM: returning a call from Reform, Doyce, CMA, cal advised she would call pt back after her lunch

## 2024-11-19 NOTE — Telephone Encounter (Signed)
 Please call pt she is having some questions about 2 of her medications which is solifenacin   and pantoprazole  (PROTONIX ) .

## 2024-11-19 NOTE — Telephone Encounter (Signed)
 Spoke with patient to follow up with her questions or concerns. Patient states her labs from November 4th, she reviewed her labs and did some research online herself about the kidneys. Patient states she knows she may should have not looked online, but the two (Vesicare  and Protonix ) medications were on there in relation to causing some kidney issues. Patient states she was here this morning for her repeat lab visit. Patient states she would like Dr Marylynn recommendations. Please advise?

## 2024-11-20 NOTE — Telephone Encounter (Signed)
 Last read by Dagoberto KANDICE Rakers at 6:07PM on 11/19/2024.

## 2024-11-28 ENCOUNTER — Other Ambulatory Visit: Payer: Self-pay | Admitting: Internal Medicine

## 2024-12-19 ENCOUNTER — Ambulatory Visit

## 2025-01-15 NOTE — Progress Notes (Signed)
 Alexis James                                          MRN: [id number]   01/15/2025   The [facility] Quality Team Specialist reviewed this patient medical record for the purposes of chart review for care gap closure. The following were reviewed: chart review for care gap closure-kidney health evaluation for diabetes:eGFR  and uACR.    [facility] Quality Team

## 2025-04-15 ENCOUNTER — Other Ambulatory Visit
